# Patient Record
Sex: Male | Born: 1965 | Race: White | Hispanic: No | Marital: Single | Smoking: Never smoker
Health system: Southern US, Community
[De-identification: ages and names within clinical notes are randomized; demographics above are authoritative.]

## PROBLEM LIST (undated history)

## (undated) DIAGNOSIS — Z79891 Long term (current) use of opiate analgesic: Secondary | ICD-10-CM

## (undated) DIAGNOSIS — Z79899 Other long term (current) drug therapy: Secondary | ICD-10-CM

## (undated) DIAGNOSIS — S14109A Unspecified injury at unspecified level of cervical spinal cord, initial encounter: Secondary | ICD-10-CM

## (undated) DIAGNOSIS — G8254 Quadriplegia, C5-C7 incomplete: Secondary | ICD-10-CM

## (undated) HISTORY — PX: TONSILLECTOMY: SUR1361

## (undated) HISTORY — PX: OTHER SURGICAL HISTORY: SHX169

---
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
## 1998-03-11 ENCOUNTER — Encounter: Admission: RE | Admit: 1998-03-11 | Discharge: 1998-03-11 | Payer: Self-pay | Admitting: Sports Medicine

## 1998-03-12 ENCOUNTER — Encounter: Admission: RE | Admit: 1998-03-12 | Discharge: 1998-03-12 | Payer: Self-pay | Admitting: Family Medicine

## 1998-05-06 ENCOUNTER — Ambulatory Visit (HOSPITAL_COMMUNITY): Admission: RE | Admit: 1998-05-06 | Discharge: 1998-05-06 | Payer: Self-pay | Admitting: *Deleted

## 1998-05-06 ENCOUNTER — Encounter: Admission: RE | Admit: 1998-05-06 | Discharge: 1998-05-06 | Payer: Self-pay | Admitting: Sports Medicine

## 1998-07-28 ENCOUNTER — Encounter: Admission: RE | Admit: 1998-07-28 | Discharge: 1998-07-28 | Payer: Self-pay | Admitting: Family Medicine

## 1998-08-22 ENCOUNTER — Encounter: Admission: RE | Admit: 1998-08-22 | Discharge: 1998-08-22 | Payer: Self-pay | Admitting: Family Medicine

## 1998-08-22 ENCOUNTER — Ambulatory Visit (HOSPITAL_COMMUNITY): Admission: RE | Admit: 1998-08-22 | Discharge: 1998-08-22 | Payer: Self-pay | Admitting: Family Medicine

## 1998-08-31 ENCOUNTER — Inpatient Hospital Stay (HOSPITAL_COMMUNITY): Admission: EM | Admit: 1998-08-31 | Discharge: 1998-09-03 | Payer: Self-pay | Admitting: Emergency Medicine

## 1998-09-08 ENCOUNTER — Encounter: Admission: RE | Admit: 1998-09-08 | Discharge: 1998-09-08 | Payer: Self-pay | Admitting: Family Medicine

## 1998-09-11 ENCOUNTER — Encounter: Admission: RE | Admit: 1998-09-11 | Discharge: 1998-09-11 | Payer: Self-pay | Admitting: Family Medicine

## 1998-09-18 ENCOUNTER — Encounter: Admission: RE | Admit: 1998-09-18 | Discharge: 1998-09-18 | Payer: Self-pay | Admitting: Family Medicine

## 1999-02-23 ENCOUNTER — Encounter: Admission: RE | Admit: 1999-02-23 | Discharge: 1999-02-23 | Payer: Self-pay | Admitting: Family Medicine

## 1999-03-06 ENCOUNTER — Encounter: Admission: RE | Admit: 1999-03-06 | Discharge: 1999-03-06 | Payer: Self-pay | Admitting: Family Medicine

## 1999-03-26 ENCOUNTER — Encounter: Admission: RE | Admit: 1999-03-26 | Discharge: 1999-03-26 | Payer: Self-pay | Admitting: Family Medicine

## 1999-05-27 ENCOUNTER — Encounter: Admission: RE | Admit: 1999-05-27 | Discharge: 1999-05-27 | Payer: Self-pay | Admitting: Family Medicine

## 1999-06-08 ENCOUNTER — Encounter: Admission: RE | Admit: 1999-06-08 | Discharge: 1999-06-08 | Payer: Self-pay | Admitting: Family Medicine

## 1999-06-12 ENCOUNTER — Encounter: Admission: RE | Admit: 1999-06-12 | Discharge: 1999-06-12 | Payer: Self-pay | Admitting: Family Medicine

## 1999-08-19 ENCOUNTER — Encounter: Admission: RE | Admit: 1999-08-19 | Discharge: 1999-08-19 | Payer: Self-pay | Admitting: Family Medicine

## 1999-09-18 ENCOUNTER — Encounter: Admission: RE | Admit: 1999-09-18 | Discharge: 1999-12-17 | Payer: Self-pay | Admitting: Sports Medicine

## 1999-12-17 ENCOUNTER — Emergency Department (HOSPITAL_COMMUNITY): Admission: EM | Admit: 1999-12-17 | Discharge: 1999-12-17 | Payer: Self-pay | Admitting: Emergency Medicine

## 1999-12-18 ENCOUNTER — Inpatient Hospital Stay (HOSPITAL_COMMUNITY): Admission: EM | Admit: 1999-12-18 | Discharge: 1999-12-24 | Payer: Self-pay

## 2000-01-06 ENCOUNTER — Encounter: Admission: RE | Admit: 2000-01-06 | Discharge: 2000-01-06 | Payer: Self-pay | Admitting: Family Medicine

## 2000-02-24 ENCOUNTER — Emergency Department (HOSPITAL_COMMUNITY): Admission: EM | Admit: 2000-02-24 | Discharge: 2000-02-24 | Payer: Self-pay | Admitting: Emergency Medicine

## 2000-02-24 ENCOUNTER — Encounter: Payer: Self-pay | Admitting: Emergency Medicine

## 2000-03-15 ENCOUNTER — Emergency Department (HOSPITAL_COMMUNITY): Admission: EM | Admit: 2000-03-15 | Discharge: 2000-03-15 | Payer: Self-pay | Admitting: Emergency Medicine

## 2000-03-23 ENCOUNTER — Encounter: Admission: RE | Admit: 2000-03-23 | Discharge: 2000-03-23 | Payer: Self-pay | Admitting: Family Medicine

## 2000-03-31 ENCOUNTER — Encounter: Admission: RE | Admit: 2000-03-31 | Discharge: 2000-03-31 | Payer: Self-pay | Admitting: Family Medicine

## 2004-04-10 ENCOUNTER — Ambulatory Visit: Payer: Self-pay | Admitting: Psychiatry

## 2004-04-10 ENCOUNTER — Inpatient Hospital Stay (HOSPITAL_COMMUNITY): Admission: RE | Admit: 2004-04-10 | Discharge: 2004-04-19 | Payer: Self-pay | Admitting: Psychiatry

## 2004-05-05 ENCOUNTER — Encounter: Admission: RE | Admit: 2004-05-05 | Discharge: 2004-06-01 | Payer: Self-pay | Admitting: Internal Medicine

## 2005-12-29 ENCOUNTER — Encounter: Admission: RE | Admit: 2005-12-29 | Discharge: 2005-12-29 | Payer: Self-pay | Admitting: Internal Medicine

## 2006-02-21 ENCOUNTER — Inpatient Hospital Stay (HOSPITAL_COMMUNITY): Admission: RE | Admit: 2006-02-21 | Discharge: 2006-02-24 | Payer: Self-pay | Admitting: *Deleted

## 2008-10-01 ENCOUNTER — Encounter: Admission: RE | Admit: 2008-10-01 | Discharge: 2008-12-30 | Payer: Self-pay | Admitting: Internal Medicine

## 2009-11-17 ENCOUNTER — Encounter
Admission: RE | Admit: 2009-11-17 | Discharge: 2009-12-22 | Payer: Self-pay | Admitting: Physical Medicine & Rehabilitation

## 2009-12-22 ENCOUNTER — Other Ambulatory Visit: Payer: Self-pay | Admitting: Emergency Medicine

## 2009-12-24 ENCOUNTER — Inpatient Hospital Stay (HOSPITAL_COMMUNITY): Admission: RE | Admit: 2009-12-24 | Discharge: 2009-12-26 | Payer: Self-pay | Admitting: Psychiatry

## 2009-12-24 ENCOUNTER — Ambulatory Visit: Payer: Self-pay | Admitting: Psychiatry

## 2010-09-28 LAB — DIFFERENTIAL
Basophils Absolute: 0 10*3/uL (ref 0.0–0.1)
Basophils Relative: 0 % (ref 0–1)
Eosinophils Absolute: 0.3 10*3/uL (ref 0.0–0.7)
Eosinophils Relative: 3 % (ref 0–5)
Lymphocytes Relative: 25 % (ref 12–46)
Lymphs Abs: 2.1 10*3/uL (ref 0.7–4.0)
Monocytes Absolute: 1.2 10*3/uL — ABNORMAL HIGH (ref 0.1–1.0)
Monocytes Relative: 14 % — ABNORMAL HIGH (ref 3–12)
Neutro Abs: 4.9 10*3/uL (ref 1.7–7.7)
Neutrophils Relative %: 58 % (ref 43–77)

## 2010-09-28 LAB — COMPREHENSIVE METABOLIC PANEL
ALT: 24 U/L (ref 0–53)
AST: 24 U/L (ref 0–37)
Albumin: 4.4 g/dL (ref 3.5–5.2)
Alkaline Phosphatase: 78 U/L (ref 39–117)
BUN: 11 mg/dL (ref 6–23)
CO2: 23 mEq/L (ref 19–32)
Calcium: 9.1 mg/dL (ref 8.4–10.5)
Chloride: 104 mEq/L (ref 96–112)
Creatinine, Ser: 1.28 mg/dL (ref 0.4–1.5)
GFR calc Af Amer: >60 mL/min (ref >60)
GFR calc non Af Amer: >60 mL/min (ref >60)
Glucose, Bld: 93 mg/dL (ref 70–99)
Potassium: 3.8 mEq/L (ref 3.5–5.1)
Sodium: 135 mEq/L (ref 135–145)
Total Bilirubin: 1.1 mg/dL (ref 0.3–1.2)
Total Protein: 8.3 g/dL (ref 6.0–8.3)

## 2010-09-28 LAB — URINALYSIS, ROUTINE W REFLEX MICROSCOPIC
Bilirubin Urine: NEGATIVE
Glucose, UA: NEGATIVE mg/dL
Hgb urine dipstick: NEGATIVE
Nitrite: NEGATIVE
Protein, ur: NEGATIVE mg/dL
Specific Gravity, Urine: 1.014 (ref 1.005–1.030)
Urobilinogen, UA: 0.2 mg/dL (ref 0.0–1.0)
pH: 5.5 (ref 5.0–8.0)

## 2010-09-28 LAB — RAPID URINE DRUG SCREEN, HOSP PERFORMED
Amphetamines: NOT DETECTED
Barbiturates: NOT DETECTED
Benzodiazepines: POSITIVE — AB
Cocaine: POSITIVE — AB
Opiates: NOT DETECTED
Tetrahydrocannabinol: NOT DETECTED

## 2010-09-28 LAB — CBC
HCT: 41.1 % (ref 39.0–52.0)
Hemoglobin: 14.3 g/dL (ref 13.0–17.0)
MCHC: 34.8 g/dL (ref 30.0–36.0)
MCV: 86 fL (ref 78.0–100.0)
Platelets: 375 10*3/uL (ref 150–400)
RBC: 4.78 MIL/uL (ref 4.22–5.81)
RDW: 13.1 % (ref 11.5–15.5)
WBC: 8.4 10*3/uL (ref 4.0–10.5)

## 2010-09-28 LAB — TROPONIN I: Troponin I: 0.01 ng/mL (ref 0.00–0.06)

## 2010-09-28 LAB — ETHANOL: Alcohol, Ethyl (B): <5 mg/dL (ref 0–10)

## 2010-09-28 LAB — CK TOTAL AND CKMB (NOT AT ARMC)
CK, MB: 2.3 ng/mL (ref 0.3–4.0)
Relative Index: 1 (ref 0.0–2.5)
Total CK: 220 U/L (ref 7–232)
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
## 2010-11-27 NOTE — Op Note (Signed)
[name], Mark Porter               ACCOUNT NO.:  [id number]   MEDICAL RECORD NO.:  [id number]          PATIENT TYPE:  INP   LOCATION:  1608                         [facility]ACILITY[facility]   PHYSICIAN:  Lebron Conners, M.D.   DATE OF BIRTH:  1965-10-25   DATE OF PROCEDURE:  02/21/2006  DATE OF DISCHARGE:                                 OPERATIVE REPORT   PRE AND POSTOPERATIVE DIAGNOSIS:  Complex abscess left arm.   OPERATION:  Debridement and drainage of abscess.   SURGEON:  Lebron Conners, M.D.   ANESTHESIA:  General.   PROCEDURE:  After the patient was monitored and asleep and had routine  preparation and draping of the left arm I made an incision around obviously  necrotic areas of skin and removed them.  I immediately went into a large  abscess cavity containing a very large amount of pus.  I evacuated that and  took cultures.  It went all way down to the muscle but I could not detect  any involvement of the bone or joint.  It was extensively undermined in the  subcutaneous plane.  A couple of other small areas of skin were obviously  necrotic and I debrided those as well.  I put a Penrose drains in the  extremities of the abscess cavity brought out through the main incision and  secured back to themselves to provide ongoing secured drainage.  After  getting good hemostasis, I packed the cavity with moist gauze and covered it  with a bulky slightly compressive bandage.  He tolerated it well and blood  loss was minimal.      Lebron Conners, M.D.  Electronically Signed     WB/MEDQ  D:  02/21/2006  T:  02/22/2006  Job:  [id number]
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
## 2010-11-27 NOTE — Discharge Summary (Signed)
NAMEVON, QUINTANAR               ACCOUNT NO.:  [id number]   MEDICAL RECORD NO.:  [id number]          PATIENT TYPE:  INP   LOCATION:  1608                         FACILITY:  [facility]   PHYSICIAN:  Lebron Conners, M.D.   DATE OF BIRTH:  1966/01/27   DATE OF ADMISSION:  02/21/2006  DATE OF DISCHARGE:  02/24/2006                                 DISCHARGE SUMMARY   HISTORY:  Mark Porter is a 45 year old quadriplegic man with an increasing  infection on the left arm who was admitted on February 21, 2006, by my  associate, Dr. Simona Huh, for an obvious abscess which needed operative drainage.  See history and physical for details.   HOSPITAL COURSE:  As I was on-call, I cared for Mark Porter, and on February 21, 2006, took him to the operating room and under general anesthesia  drained large abscess in the soft tissues of the left arm.  It did not  appear to involve the bone.  He had a lot of pain postoperatively and  cellulitis was present.  It gradually improved, however.  By February 24, 2006, he was afebrile.  The redness was decreased and the drainage was  decreased.  He was sent home on doxycycline with presumptive diagnosis of  methicillin-resistant Staphylococcus aureus.  Home Health nursing was  arranged to change the bandages for him.  It was suggested that he be seen  in our office in 4-7 days.   DIAGNOSIS:  1. Abscess left arm.  2. Quadriplegia.  3. History of substance abuse.   OPERATION:  Incision and drainage of abscess left arm.   DISCHARGE CONDITION:  Improved.      Lebron Conners, M.D.  Electronically Signed     WB/MEDQ  D:  03/29/2006  T:  03/30/2006  Job:  [id number]

## 2013-01-07 ENCOUNTER — Encounter (HOSPITAL_COMMUNITY): Payer: Self-pay | Admitting: *Deleted

## 2013-01-07 ENCOUNTER — Emergency Department (HOSPITAL_COMMUNITY)
Admission: EM | Admit: 2013-01-07 | Discharge: 2013-01-07 | Disposition: A | Discharge status: Home / Self Care | Payer: Medicare Other | Attending: Emergency Medicine | Admitting: Emergency Medicine

## 2013-01-07 DIAGNOSIS — G8254 Quadriplegia, C5-C7 incomplete: Secondary | ICD-10-CM | POA: Insufficient documentation

## 2013-01-07 DIAGNOSIS — M542 Cervicalgia: Secondary | ICD-10-CM | POA: Insufficient documentation

## 2013-01-07 DIAGNOSIS — M25559 Pain in unspecified hip: Secondary | ICD-10-CM | POA: Insufficient documentation

## 2013-01-07 DIAGNOSIS — G8929 Other chronic pain: Secondary | ICD-10-CM

## 2013-01-07 DIAGNOSIS — M546 Pain in thoracic spine: Secondary | ICD-10-CM | POA: Insufficient documentation

## 2013-01-07 HISTORY — DX: Quadriplegia, C5-C7 incomplete: G82.54

## 2013-01-07 MED ORDER — OXYCODONE-ACETAMINOPHEN 10-325 MG PO TABS: 1.0000 | ORAL_TABLET | ORAL | Status: DC | PRN

## 2013-01-07 MED ORDER — DIAZEPAM 10 MG PO TABS: 10.0000 mg | ORAL_TABLET | Freq: Four times a day (QID) | ORAL | Status: DC | PRN
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
## 2013-01-07 NOTE — ED Notes (Signed)
Pt reports having chronic pain, has been out of meds and is currently changing doctors, doesn't have an appt until the 11th and needs meds until then. No acute distress noted at triage.

## 2013-01-07 NOTE — ED Notes (Signed)
Called to triage x3; no response

## 2013-01-07 NOTE — ED Notes (Signed)
Pt states he was in the bathroom when staff were calling for triage

## 2013-01-08 NOTE — ED Provider Notes (Signed)
Medical screening examination/treatment/procedure(s) were performed by non-physician practitioner and as supervising physician I was immediately available for consultation/collaboration.    Y. , MD 01/08/13 1022 

## 2013-01-17 ENCOUNTER — Encounter (HOSPITAL_COMMUNITY): Payer: Self-pay | Admitting: Emergency Medicine

## 2013-01-17 DIAGNOSIS — G8929 Other chronic pain: Secondary | ICD-10-CM | POA: Insufficient documentation

## 2013-01-17 DIAGNOSIS — M549 Dorsalgia, unspecified: Secondary | ICD-10-CM | POA: Insufficient documentation

## 2013-01-17 DIAGNOSIS — IMO0001 Reserved for inherently not codable concepts without codable children: Secondary | ICD-10-CM | POA: Insufficient documentation

## 2013-01-17 DIAGNOSIS — Z8669 Personal history of other diseases of the nervous system and sense organs: Secondary | ICD-10-CM | POA: Insufficient documentation

## 2013-01-17 NOTE — ED Notes (Signed)
PT. REQUESTING PRESCRIPTION FOR HIS PAIN / SPASMS MEDICATIONS , PT. STATED HE RAN OUT OF HIS MEDICATIONS FOR 4 DAYS .

## 2013-01-18 ENCOUNTER — Emergency Department (HOSPITAL_COMMUNITY)
Admission: EM | Admit: 2013-01-18 | Discharge: 2013-01-18 | Disposition: A | Discharge status: Home / Self Care | Payer: Medicare Other | Attending: Emergency Medicine | Admitting: Emergency Medicine

## 2013-01-18 DIAGNOSIS — G8929 Other chronic pain: Secondary | ICD-10-CM

## 2013-01-18 MED ORDER — DIAZEPAM 10 MG PO TABS: 10.0000 mg | ORAL_TABLET | Freq: Four times a day (QID) | ORAL | Status: DC | PRN

## 2013-01-18 MED ORDER — DIAZEPAM 5 MG PO TABS
10.0000 mg | ORAL_TABLET | Freq: Once | ORAL | Status: AC
  Administered 2013-01-18: 10 mg via ORAL
  Filled 2013-01-18: qty 2

## 2013-01-18 MED ORDER — OXYCODONE-ACETAMINOPHEN 10-325 MG PO TABS: 1.0000 | ORAL_TABLET | ORAL | Status: DC | PRN

## 2013-01-18 MED ORDER — OXYCODONE-ACETAMINOPHEN 5-325 MG PO TABS
2.0000 | ORAL_TABLET | Freq: Once | ORAL | Status: AC
  Administered 2013-01-18: 2 via ORAL
  Filled 2013-01-18: qty 2
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
## 2013-01-18 NOTE — ED Provider Notes (Signed)
Medical screening examination/treatment/procedure(s) were performed by non-physician practitioner and as supervising physician I was immediately available for consultation/collaboration.   M , MD 01/18/13 2156 

## 2013-01-18 NOTE — Discharge Instructions (Signed)
Chronic Pain Management Managing chronic pain is not easy. The goal is to provide as much pain relief as possible. There are emotional as well as physical problems. Chronic pain may lead to symptoms of depression which magnify those of the pain. Problems may include:  Anxiety.  Sleep disturbances.  Confused thinking.  Feeling cranky.  Fatigue.  Weight gain or loss. Identify the source of the pain first, if possible. The pain may be masking another problem. Try to find a pain management specialist or clinic. Work with a team to create a treatment plan for you. MEDICATIONS  May include narcotics or opioids. Larger than normal doses may be needed to control your pain.  Drugs for depression may help.  Over-the-counter medicines may help for some conditions. These drugs may be used along with others for better pain relief.  May be injected into sites such as the spine and joints. Injections may have to be repeated if they wear off. THERAPY MAY INCLUDE:  Working with a physical therapist to keep from getting stiff.  Regular, gentle exercise.  Cognitive or behavioral therapy.  Using complementary or integrative medicine such as:  Acupuncture.  Massage, Reiki, or Rolfing.  Aroma, color, light, or sound therapy.  Group support. FOR MORE INFORMATION [email or web address]. American Chronic Pain Association [email or web address]. Document Released: 08/05/2004 Document Revised: 09/20/2011 Document Reviewed: 09/14/2007 [facility] Patient Information 2014 [place], [place]. RESOURCE GUIDE  Chronic Pain Problems: Contact [name]  [phone] Patients need to be referred by their primary care doctor.  Insufficient Money for Medicine: Contact United Way:  call "211."   No Primary Care Doctor: - Call Health Connect  [phone] - can help you locate a primary care doctor that  accepts your insurance, provides certain services, etc. - Physician Referral  Service- [phone]  Agencies that provide inexpensive medical care: - [facility]  [phone] - [facility]  [phone] - [facility]  [phone] - Women's Clinic  [phone] - Planned Parenthood  [phone] [name]  [phone]  Medicaid-accepting [facility] Providers: - [facility]- [address]  [phone], Mon-Fri 9am-7pm, Sat 9am-1pm - [facility]- [address] [place], [address] [place]  [phone] - [facility]- [address] [place]  [phone] [facility]- [address]  [phone] - Renaye Rakers- [address] [place], [address], [phone]  Only accepts [place] patients after they have their name  applied to their card  Self Pay (no insurance) in [place]: - Sickle Cell Patients - [facility]  [address] [place], [phone] - [facility]- [address] [place]  [phone]       [facility] [place]- [address]       -     [facility]- see information above (Speak to [employer] if you do not have insurance)       -  [facility]- [address],  [phone]       -  [facility]- [address], [phone]       -  Dr Julio Sicks-  [address], [place], [phone]       -  [facility] - [address] [phone]       -  [facility]- [address], [phone], also [address] [phone]       -     [facility]- [address] [place], [phone], 1st & 3rd Saturday         every month, 10am-1pm  -     [facility]   [address] [place], [place].   Phone:  [phone], Fax:  [phone]. Hours of Operation:  9 am - 6 pm, M-F.  -     [facility]   [address], [place]   Phone:  [phone], Fax: [phone]. Hours of Operation:  8:30 am - 5:30 pm, M-F.  [facility] [address] [place] [place], [place] [address] [phone]  [facility] [address] [place], [place] [address] [phone]  1) Find a Doctor and Pay Out of Pocket Although you won't have to find out who is covered by your insurance plan, it is a good idea to ask around and get recommendations. You will then need to call the office and see if the doctor you have chosen will accept you as a new patient and what types of options they offer for patients who are self-pay. Some doctors offer discounts or will set up payment plans for their patients who do not have insurance, but you will need to ask so you aren't surprised when you get to your appointment.  2) Contact Your Local Health Department Not all health departments have doctors that can see patients for sick visits, but many do, so it is worth a call to see if yours does. If you don't know where your local health department is, you can check in your phone book. The CDC also has a tool to help you locate your state's health department, and many state websites also have listings of all of their local health departments.  3) Find a Walk-in Clinic If your illness is not likely to be very severe or complicated, you may want to try a walk in clinic. These are popping up all over the country in pharmacies, drugstores, and shopping centers. They're usually staffed by nurse practitioners or physician assistants that have been trained to treat common illnesses and complaints. They're usually fairly quick and inexpensive. However, if you have serious medical issues or chronic medical problems, these are probably not your best option  STD Testing - [facility] [address] [facility] [address] [facility] Department of [facility] [place], STD Clinic, [address], [place], phone [phone] or [phone].  Monday - Friday, call for an appointment. [facility] Department of [employer], STD Clinic, [address], [place], phone [phone] or [phone].  Monday - Friday, call for an appointment.  Abuse/Neglect: [facility] Child Abuse Hotline [phone] [facility] Child Abuse Hotline [phone] (After Hours)  Emergency Shelter:  [facility] 8624265600  Maternity Homes: - [facility] [place] [facility] [phone] - [facility] [phone]  MRSA Hotline #:   302-519-9967  Dental Assistance If unable to pay or uninsured, contact:  [facility]. to become qualified for the adult dental clinic.  Patients with Medicaid: [facility] [phone] [address], [phone] [phone]  If unable to pay, or uninsured, contact [facility] [phone] in [place], [phone] in [place]) to become qualified for the adult dental clinic  [address]  Dental Clinic [address] [place], [place] [address] [phone] [email or web address]  Other Proofreader Services: - [facility]- [address] [place], [place], [place], [address], [phone], Ext. [phone], 2nd and 4th Thursday of the month at 6:30am.  10 clients each day by appointment, can sometimes see walk-in patients if someone does not show for an appointment. [facility]- [address] [place], [place], [address], [phone] - [facility]- [address] [place], [place], [address], [phone] - [facility]- [phone] [facility] Department- [phone] [facility]- [phone]

## 2013-01-22 ENCOUNTER — Encounter (HOSPITAL_COMMUNITY): Payer: Self-pay | Admitting: Emergency Medicine

## 2013-01-22 ENCOUNTER — Emergency Department (HOSPITAL_COMMUNITY)
Admission: EM | Admit: 2013-01-22 | Discharge: 2013-01-22 | Disposition: A | Discharge status: Home / Self Care | Payer: Medicare Other | Attending: Emergency Medicine | Admitting: Emergency Medicine

## 2013-01-22 DIAGNOSIS — G8929 Other chronic pain: Secondary | ICD-10-CM | POA: Insufficient documentation

## 2013-01-22 DIAGNOSIS — M25519 Pain in unspecified shoulder: Secondary | ICD-10-CM | POA: Insufficient documentation

## 2013-01-22 DIAGNOSIS — M79609 Pain in unspecified limb: Secondary | ICD-10-CM | POA: Insufficient documentation

## 2013-01-22 DIAGNOSIS — M549 Dorsalgia, unspecified: Secondary | ICD-10-CM | POA: Insufficient documentation

## 2013-01-22 MED ORDER — DIAZEPAM 5 MG PO TABS
5.0000 mg | ORAL_TABLET | Freq: Once | ORAL | Status: AC
  Administered 2013-01-22: 5 mg via ORAL
  Filled 2013-01-22: qty 1

## 2013-01-22 MED ORDER — OXYCODONE-ACETAMINOPHEN 5-325 MG PO TABS
2.0000 | ORAL_TABLET | Freq: Once | ORAL | Status: AC
  Administered 2013-01-22: 2 via ORAL
  Filled 2013-01-22: qty 2
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
## 2013-01-22 NOTE — ED Notes (Signed)
Patient states that he was supposed to see a spinal specialist (new doctor) today, but the office called him on Friday and rescheduled him for 02/02/13.  Patient states he has run out of pain medication and needs refill.

## 2013-01-22 NOTE — ED Notes (Signed)
Went to discharge patient.  He requests to speak with MD again.

## 2013-01-31 ENCOUNTER — Emergency Department (HOSPITAL_COMMUNITY)
Admission: EM | Admit: 2013-01-31 | Discharge: 2013-02-01 | Disposition: A | Discharge status: Home / Self Care | Payer: Medicare Other | Attending: Emergency Medicine | Admitting: Emergency Medicine

## 2013-01-31 ENCOUNTER — Encounter (HOSPITAL_COMMUNITY): Payer: Self-pay | Admitting: Emergency Medicine

## 2013-01-31 DIAGNOSIS — G8254 Quadriplegia, C5-C7 incomplete: Secondary | ICD-10-CM | POA: Insufficient documentation

## 2013-01-31 DIAGNOSIS — Z79899 Other long term (current) drug therapy: Secondary | ICD-10-CM | POA: Insufficient documentation

## 2013-01-31 DIAGNOSIS — G8929 Other chronic pain: Secondary | ICD-10-CM | POA: Insufficient documentation

## 2013-01-31 DIAGNOSIS — Z760 Encounter for issue of repeat prescription: Secondary | ICD-10-CM | POA: Insufficient documentation

## 2013-01-31 NOTE — ED Notes (Signed)
PT. REQUESTING PRESCRIPTIONS FOR HIS VALIUM AND PERCOCET FOR HIS CHRONIC PAIN , HE RAN OUT 2 WEEKS AGO.

## 2013-02-01 MED ORDER — DIAZEPAM 5 MG PO TABS: 5.0000 mg | ORAL_TABLET | Freq: Four times a day (QID) | ORAL | Status: DC | PRN

## 2013-02-01 MED ORDER — OXYCODONE-ACETAMINOPHEN 5-325 MG PO TABS: ORAL_TABLET | ORAL | Status: DC

## 2013-02-01 NOTE — ED Provider Notes (Signed)
Medical screening examination/treatment/procedure(s) were performed by non-physician practitioner and as supervising physician I was immediately available for consultation/collaboration.   Hanley Seamen, MD 02/01/13 1205

## 2013-02-01 NOTE — ED Notes (Signed)
No change from Triage notes.
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
## 2014-02-05 ENCOUNTER — Other Ambulatory Visit (HOSPITAL_COMMUNITY): Payer: Self-pay | Admitting: Psychiatry

## 2014-02-05 ENCOUNTER — Ambulatory Visit (HOSPITAL_COMMUNITY)
Admission: RE | Admit: 2014-02-05 | Discharge: 2014-02-05 | Disposition: A | Discharge status: Home / Self Care | Payer: Medicare Other | Source: Ambulatory Visit | Attending: Psychiatry | Admitting: Psychiatry

## 2014-02-05 DIAGNOSIS — M479 Spondylosis, unspecified: Secondary | ICD-10-CM | POA: Diagnosis not present

## 2014-02-05 DIAGNOSIS — R52 Pain, unspecified: Secondary | ICD-10-CM | POA: Insufficient documentation

## 2014-02-05 DIAGNOSIS — M542 Cervicalgia: Secondary | ICD-10-CM

## 2015-01-12 ENCOUNTER — Emergency Department (HOSPITAL_COMMUNITY): Payer: Medicare Other

## 2015-01-12 ENCOUNTER — Emergency Department (HOSPITAL_COMMUNITY)
Admission: EM | Admit: 2015-01-12 | Discharge: 2015-01-12 | Disposition: A | Discharge status: Home / Self Care | Payer: Medicare Other | Attending: Emergency Medicine | Admitting: Emergency Medicine

## 2015-01-12 ENCOUNTER — Encounter (HOSPITAL_COMMUNITY): Payer: Self-pay | Admitting: *Deleted

## 2015-01-12 DIAGNOSIS — Z79899 Other long term (current) drug therapy: Secondary | ICD-10-CM | POA: Insufficient documentation

## 2015-01-12 DIAGNOSIS — S2232XA Fracture of one rib, left side, initial encounter for closed fracture: Secondary | ICD-10-CM

## 2015-01-12 DIAGNOSIS — W108XXA Fall (on) (from) other stairs and steps, initial encounter: Secondary | ICD-10-CM | POA: Diagnosis not present

## 2015-01-12 DIAGNOSIS — S299XXA Unspecified injury of thorax, initial encounter: Secondary | ICD-10-CM | POA: Diagnosis present

## 2015-01-12 DIAGNOSIS — Y9389 Activity, other specified: Secondary | ICD-10-CM | POA: Diagnosis not present

## 2015-01-12 DIAGNOSIS — Y92009 Unspecified place in unspecified non-institutional (private) residence as the place of occurrence of the external cause: Secondary | ICD-10-CM | POA: Insufficient documentation

## 2015-01-12 DIAGNOSIS — Y998 Other external cause status: Secondary | ICD-10-CM | POA: Insufficient documentation

## 2015-01-12 DIAGNOSIS — S2242XA Multiple fractures of ribs, left side, initial encounter for closed fracture: Secondary | ICD-10-CM | POA: Insufficient documentation

## 2015-01-12 LAB — URINALYSIS, ROUTINE W REFLEX MICROSCOPIC
Glucose, UA: NEGATIVE mg/dL
Ketones, ur: 15 mg/dL — AB
Nitrite: NEGATIVE
Protein, ur: 100 mg/dL — AB
Specific Gravity, Urine: 1.028 (ref 1.005–1.030)
Urobilinogen, UA: 1 mg/dL (ref 0.0–1.0)
pH: 5 (ref 5.0–8.0)

## 2015-01-12 LAB — CBC WITH DIFFERENTIAL/PLATELET
Basophils Absolute: 0.1 10*3/uL (ref 0.0–0.1)
Basophils Relative: 0 % (ref 0–1)
Eosinophils Absolute: 0.5 10*3/uL (ref 0.0–0.7)
Eosinophils Relative: 4 % (ref 0–5)
HCT: 38.7 % — ABNORMAL LOW (ref 39.0–52.0)
Hemoglobin: 13.2 g/dL (ref 13.0–17.0)
Lymphocytes Relative: 33 % (ref 12–46)
Lymphs Abs: 4.8 10*3/uL — ABNORMAL HIGH (ref 0.7–4.0)
MCH: 28 pg (ref 26.0–34.0)
MCHC: 34.1 g/dL (ref 30.0–36.0)
MCV: 82 fL (ref 78.0–100.0)
Monocytes Absolute: 1.5 10*3/uL — ABNORMAL HIGH (ref 0.1–1.0)
Monocytes Relative: 10 % (ref 3–12)
Neutro Abs: 7.6 10*3/uL (ref 1.7–7.7)
Neutrophils Relative %: 53 % (ref 43–77)
Platelets: 537 10*3/uL — ABNORMAL HIGH (ref 150–400)
RBC: 4.72 MIL/uL (ref 4.22–5.81)
RDW: 13.1 % (ref 11.5–15.5)
WBC: 14.4 10*3/uL — ABNORMAL HIGH (ref 4.0–10.5)

## 2015-01-12 LAB — COMPREHENSIVE METABOLIC PANEL
ALT: 18 U/L (ref 17–63)
AST: 23 U/L (ref 15–41)
Albumin: 3.5 g/dL (ref 3.5–5.0)
Alkaline Phosphatase: 93 U/L (ref 38–126)
Anion gap: 10 (ref 5–15)
BUN: 19 mg/dL (ref 6–20)
CO2: 23 mmol/L (ref 22–32)
Calcium: 8.6 mg/dL — ABNORMAL LOW (ref 8.9–10.3)
Chloride: 101 mmol/L (ref 101–111)
Creatinine, Ser: 1.56 mg/dL — ABNORMAL HIGH (ref 0.61–1.24)
GFR calc Af Amer: 59 mL/min — ABNORMAL LOW (ref >60)
GFR calc non Af Amer: 51 mL/min — ABNORMAL LOW (ref >60)
Glucose, Bld: 86 mg/dL (ref 65–99)
Potassium: 4 mmol/L (ref 3.5–5.1)
Sodium: 134 mmol/L — ABNORMAL LOW (ref 135–145)
Total Bilirubin: 0.4 mg/dL (ref 0.3–1.2)
Total Protein: 7.5 g/dL (ref 6.5–8.1)

## 2015-01-12 LAB — URINE MICROSCOPIC-ADD ON

## 2015-01-12 MED ORDER — OXYCODONE-ACETAMINOPHEN 10-325 MG PO TABS: 1.0000 | ORAL_TABLET | ORAL | Status: DC | PRN

## 2015-01-12 MED ORDER — HYDROMORPHONE HCL 1 MG/ML IJ SOLN
1.0000 mg | Freq: Once | INTRAMUSCULAR | Status: AC
  Administered 2015-01-12: 1 mg via INTRAMUSCULAR
  Filled 2015-01-12: qty 1

## 2015-01-12 MED ORDER — SODIUM CHLORIDE 0.9 % IV BOLUS (SEPSIS)
1000.0000 mL | Freq: Once | INTRAVENOUS | Status: AC
  Administered 2015-01-12: 1000 mL via INTRAVENOUS

## 2015-01-12 MED ORDER — CLINDAMYCIN HCL 150 MG PO CAPS: 450.0000 mg | ORAL_CAPSULE | Freq: Three times a day (TID) | ORAL | Status: DC

## 2015-01-12 MED ORDER — HYDROMORPHONE HCL 1 MG/ML IJ SOLN
1.0000 mg | Freq: Once | INTRAMUSCULAR | Status: AC
  Administered 2015-01-12: 1 mg via INTRAVENOUS
  Filled 2015-01-12: qty 1

## 2015-01-12 MED ORDER — SODIUM CHLORIDE 0.9 % IV BOLUS (SEPSIS): 1000.0000 mL | Freq: Once | INTRAVENOUS | Status: DC
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
## 2015-01-12 NOTE — ED Notes (Signed)
Pt aware of urine sample needed, pt unable to go at this time. 
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
## 2015-01-12 NOTE — Care Management Note (Signed)
Case Management Note  Patient Details  Name: Mark Porter MRN: [id number] Date of Birth: 02-07-1966  Subjective/Objective:                    Action/Plan:   Expected Discharge Date:                  Expected Discharge Plan:     In-House Referral:     Discharge planning Services  CM Consult  Post Acute Care Choice:    Choice offered to:     DME Arranged:    DME Agency:     HH Arranged:  RN HH Agency:     Status of Service:  In process, will continue to follow  Medicare Important Message Given:    Date Medicare IM Given:    Medicare IM give by:    Date Additional Medicare IM Given:    Additional Medicare Important Message give by:     If discussed at Long Length of Stay Meetings, dates discussed:    Additional Comments:  Yvone NeuCrutchfield,  M, RN 01/12/2015, 2:04 PM

## 2015-01-12 NOTE — Discharge Instructions (Signed)
Return here as needed. You do have fractured ribs, so you can use heat and ice on the area that is sore .  Follow up with your primary care doctor
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
## 2015-01-12 NOTE — ED Notes (Signed)
Pt still unable to give urine sample

## 2015-01-12 NOTE — ED Notes (Signed)
Pt is off the floor  

## 2015-01-12 NOTE — ED Notes (Signed)
Attempted a urinary in and out catheter. Was unable to get past the Patient's prostate with multiple tries and various techniques.
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
## 2015-01-12 NOTE — ED Notes (Signed)
NAD at this time. [facility] has been called to pick pt up.

## 2015-05-08 ENCOUNTER — Ambulatory Visit: Payer: Medicare Other | Attending: Neurology | Admitting: Physical Therapy

## 2015-05-08 DIAGNOSIS — M6281 Muscle weakness (generalized): Secondary | ICD-10-CM | POA: Diagnosis not present

## 2015-05-08 DIAGNOSIS — R532 Functional quadriplegia: Secondary | ICD-10-CM | POA: Diagnosis present
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
## 2016-03-15 ENCOUNTER — Emergency Department (HOSPITAL_COMMUNITY): Payer: Medicare Other

## 2016-03-15 ENCOUNTER — Emergency Department (HOSPITAL_COMMUNITY)
Admission: EM | Admit: 2016-03-15 | Discharge: 2016-03-15 | Disposition: A | Discharge status: Home / Self Care | Payer: Medicare Other | Attending: Emergency Medicine | Admitting: Emergency Medicine

## 2016-03-15 ENCOUNTER — Encounter (HOSPITAL_COMMUNITY): Payer: Self-pay | Admitting: Emergency Medicine

## 2016-03-15 DIAGNOSIS — Y939 Activity, unspecified: Secondary | ICD-10-CM | POA: Insufficient documentation

## 2016-03-15 DIAGNOSIS — S20212A Contusion of left front wall of thorax, initial encounter: Secondary | ICD-10-CM | POA: Diagnosis not present

## 2016-03-15 DIAGNOSIS — Y9289 Other specified places as the place of occurrence of the external cause: Secondary | ICD-10-CM | POA: Diagnosis not present

## 2016-03-15 DIAGNOSIS — R1012 Left upper quadrant pain: Secondary | ICD-10-CM | POA: Diagnosis not present

## 2016-03-15 DIAGNOSIS — S299XXA Unspecified injury of thorax, initial encounter: Secondary | ICD-10-CM | POA: Diagnosis present

## 2016-03-15 DIAGNOSIS — W050XXA Fall from non-moving wheelchair, initial encounter: Secondary | ICD-10-CM | POA: Insufficient documentation

## 2016-03-15 DIAGNOSIS — Y999 Unspecified external cause status: Secondary | ICD-10-CM | POA: Diagnosis not present

## 2016-03-15 DIAGNOSIS — R0781 Pleurodynia: Secondary | ICD-10-CM

## 2016-03-15 DIAGNOSIS — W19XXXA Unspecified fall, initial encounter: Secondary | ICD-10-CM

## 2016-03-15 LAB — CBC WITH DIFFERENTIAL/PLATELET
BASOS ABS: 0 10*3/uL (ref 0.0–0.1)
BASOS PCT: 0 %
EOS PCT: 4 %
Eosinophils Absolute: 0.4 10*3/uL (ref 0.0–0.7)
HEMATOCRIT: 40.5 % (ref 39.0–52.0)
Hemoglobin: 13.8 g/dL (ref 13.0–17.0)
Lymphocytes Relative: 26 %
Lymphs Abs: 2.6 10*3/uL (ref 0.7–4.0)
MCH: 28.7 pg (ref 26.0–34.0)
MCHC: 34.1 g/dL (ref 30.0–36.0)
MCV: 84.2 fL (ref 78.0–100.0)
MONO ABS: 1.1 10*3/uL — AB (ref 0.1–1.0)
MONOS PCT: 11 %
NEUTROS ABS: 6 10*3/uL (ref 1.7–7.7)
Neutrophils Relative %: 59 %
PLATELETS: 429 10*3/uL — AB (ref 150–400)
RBC: 4.81 MIL/uL (ref 4.22–5.81)
RDW: 12.7 % (ref 11.5–15.5)
WBC: 10.2 10*3/uL (ref 4.0–10.5)

## 2016-03-15 LAB — BASIC METABOLIC PANEL
ANION GAP: 9 (ref 5–15)
BUN: 15 mg/dL (ref 6–20)
CALCIUM: 9.3 mg/dL (ref 8.9–10.3)
CO2: 21 mmol/L — AB (ref 22–32)
CREATININE: 1.18 mg/dL (ref 0.61–1.24)
Chloride: 107 mmol/L (ref 101–111)
GFR calc Af Amer: >60 mL/min (ref >60)
Glucose, Bld: 89 mg/dL (ref 65–99)
Potassium: 3.7 mmol/L (ref 3.5–5.1)
Sodium: 137 mmol/L (ref 135–145)

## 2016-03-15 MED ORDER — ONDANSETRON 4 MG PO TBDP
4.0000 mg | ORAL_TABLET | Freq: Once | ORAL | Status: AC
  Administered 2016-03-15: 4 mg via ORAL
  Filled 2016-03-15: qty 1

## 2016-03-15 MED ORDER — HYDROMORPHONE HCL 1 MG/ML IJ SOLN
0.5000 mg | Freq: Once | INTRAMUSCULAR | Status: AC
  Administered 2016-03-15: 0.5 mg via INTRAMUSCULAR
  Filled 2016-03-15: qty 1

## 2016-03-15 MED ORDER — POTASSIUM CHLORIDE CRYS ER 20 MEQ PO TBCR: 40.0000 meq | EXTENDED_RELEASE_TABLET | Freq: Once | ORAL | Status: DC

## 2016-03-15 MED ORDER — DIAZEPAM 5 MG PO TABS
10.0000 mg | ORAL_TABLET | Freq: Once | ORAL | Status: AC
  Administered 2016-03-15: 10 mg via ORAL
  Filled 2016-03-15: qty 2

## 2016-03-15 MED ORDER — IOPAMIDOL (ISOVUE-300) INJECTION 61%
INTRAVENOUS | Status: AC
  Administered 2016-03-15: 100 mL
  Filled 2016-03-15: qty 100

## 2016-03-15 MED ORDER — OXYCODONE-ACETAMINOPHEN 10-325 MG PO TABS: 1.0000 | ORAL_TABLET | ORAL | 0 refills | Status: DC | PRN

## 2016-03-15 NOTE — ED Triage Notes (Signed)
Per EMS, pt was moving from toilet to wheelchair and hit his right side on the sink. Pt c/o right sided rib pain. BP-128/90, HR-80, SpO2-97 ra.

## 2016-03-15 NOTE — Discharge Planning (Signed)
EDCM contacted regarding insurance and medication assistance.  EDCM researched the chart to find that pt has active Medicare and Medicaid.  Will advise pt to contact number on Medicare care to find PCP availability in this area.

## 2016-03-15 NOTE — ED Provider Notes (Signed)
Complains of left-sided rib pain and left-sided upper abdominal pain after he fell from his wheelchair hitting his ribs against a sink earlier this morning. No other injury No other associated symptoms. On exam alert Glasgow Coma Score 15 chest is tender at left anterior ribs. No ecchymosis no crepitance or flail. Lungs clear auscultation abdomen midline surgical scar. Tender at left upper quadrant   Doug SouSam , MD 03/15/16 [phone]

## 2016-03-15 NOTE — Discharge Instructions (Signed)
Take percocet for breakthrough pain, do not drink alcohol, drive, care for children or do other critical tasks while taking percocet.   It is very important that you take deep breaths to prevent lung collapse and infection.  Either use your incentive spirometer or take 10 deep breaths every hour to prevent lung collapse.  If you develop cough, fever or shortness of breath return immediately to the emergency room.   Do not hesitate to return to the emergency room for any new, worsening or concerning symptoms.  Please obtain primary care using resource guide below. Let them know that you were seen in the emergency room and that they will need to obtain records for further outpatient management.

## 2016-03-15 NOTE — Progress Notes (Signed)
Incentive spirometer given to patient.  Patient performed, with good effort, and achieved a goal of 750.  Instructed patient to keep working with it every hour.  No complications.
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
## 2016-05-03 ENCOUNTER — Emergency Department (HOSPITAL_COMMUNITY)
Admission: EM | Admit: 2016-05-03 | Discharge: 2016-05-03 | Disposition: A | Discharge status: Home / Self Care | Payer: Medicare Other | Attending: Emergency Medicine | Admitting: Emergency Medicine

## 2016-05-03 ENCOUNTER — Encounter (HOSPITAL_COMMUNITY): Payer: Self-pay | Admitting: *Deleted

## 2016-05-03 ENCOUNTER — Emergency Department (HOSPITAL_COMMUNITY): Payer: Medicare Other

## 2016-05-03 DIAGNOSIS — R109 Unspecified abdominal pain: Secondary | ICD-10-CM | POA: Insufficient documentation

## 2016-05-03 DIAGNOSIS — S20212A Contusion of left front wall of thorax, initial encounter: Secondary | ICD-10-CM | POA: Diagnosis not present

## 2016-05-03 DIAGNOSIS — Y939 Activity, unspecified: Secondary | ICD-10-CM | POA: Insufficient documentation

## 2016-05-03 DIAGNOSIS — W1839XA Other fall on same level, initial encounter: Secondary | ICD-10-CM | POA: Diagnosis not present

## 2016-05-03 DIAGNOSIS — Y929 Unspecified place or not applicable: Secondary | ICD-10-CM | POA: Insufficient documentation

## 2016-05-03 DIAGNOSIS — Y999 Unspecified external cause status: Secondary | ICD-10-CM | POA: Diagnosis not present

## 2016-05-03 DIAGNOSIS — S0990XA Unspecified injury of head, initial encounter: Secondary | ICD-10-CM | POA: Diagnosis not present

## 2016-05-03 DIAGNOSIS — S299XXA Unspecified injury of thorax, initial encounter: Secondary | ICD-10-CM | POA: Diagnosis present

## 2016-05-03 DIAGNOSIS — W19XXXA Unspecified fall, initial encounter: Secondary | ICD-10-CM

## 2016-05-03 LAB — CDS SEROLOGY

## 2016-05-03 LAB — COMPREHENSIVE METABOLIC PANEL
ALK PHOS: 90 U/L (ref 38–126)
ALT: 40 U/L (ref 17–63)
AST: 34 U/L (ref 15–41)
Albumin: 3.8 g/dL (ref 3.5–5.0)
Anion gap: 12 (ref 5–15)
BUN: 16 mg/dL (ref 6–20)
CALCIUM: 9.3 mg/dL (ref 8.9–10.3)
CO2: 20 mmol/L — ABNORMAL LOW (ref 22–32)
CREATININE: 0.9 mg/dL (ref 0.61–1.24)
Chloride: 104 mmol/L (ref 101–111)
Glucose, Bld: 78 mg/dL (ref 65–99)
Potassium: 3.9 mmol/L (ref 3.5–5.1)
Sodium: 136 mmol/L (ref 135–145)
Total Bilirubin: 0.4 mg/dL (ref 0.3–1.2)
Total Protein: 7.5 g/dL (ref 6.5–8.1)

## 2016-05-03 LAB — CBC
HCT: 39.2 % (ref 39.0–52.0)
Hemoglobin: 13.7 g/dL (ref 13.0–17.0)
MCH: 29.1 pg (ref 26.0–34.0)
MCHC: 34.9 g/dL (ref 30.0–36.0)
MCV: 83.2 fL (ref 78.0–100.0)
PLATELETS: 357 10*3/uL (ref 150–400)
RBC: 4.71 MIL/uL (ref 4.22–5.81)
RDW: 12.7 % (ref 11.5–15.5)
WBC: 13.7 10*3/uL — AB (ref 4.0–10.5)

## 2016-05-03 LAB — I-STAT CHEM 8, ED
BUN: 21 mg/dL — AB (ref 6–20)
CHLORIDE: 108 mmol/L (ref 101–111)
CREATININE: 1 mg/dL (ref 0.61–1.24)
Calcium, Ion: 1.1 mmol/L — ABNORMAL LOW (ref 1.15–1.40)
GLUCOSE: 76 mg/dL (ref 65–99)
HCT: 41 % (ref 39.0–52.0)
Hemoglobin: 13.9 g/dL (ref 13.0–17.0)
POTASSIUM: 4.4 mmol/L (ref 3.5–5.1)
Sodium: 138 mmol/L (ref 135–145)
TCO2: 23 mmol/L (ref 0–100)

## 2016-05-03 LAB — URINALYSIS, ROUTINE W REFLEX MICROSCOPIC
BILIRUBIN URINE: NEGATIVE
GLUCOSE, UA: NEGATIVE mg/dL
Hgb urine dipstick: NEGATIVE
KETONES UR: NEGATIVE mg/dL
LEUKOCYTES UA: NEGATIVE
NITRITE: NEGATIVE
PROTEIN: NEGATIVE mg/dL
Specific Gravity, Urine: <1.005 — ABNORMAL LOW (ref 1.005–1.030)
pH: 5.5 (ref 5.0–8.0)

## 2016-05-03 LAB — ETHANOL

## 2016-05-03 LAB — PROTIME-INR
INR: 0.95
Prothrombin Time: 12.6 seconds (ref 11.4–15.2)

## 2016-05-03 LAB — SAMPLE TO BLOOD BANK

## 2016-05-03 LAB — I-STAT CG4 LACTIC ACID, ED: Lactic Acid, Venous: 1.22 mmol/L (ref 0.5–1.9)

## 2016-05-03 MED ORDER — OXYCODONE-ACETAMINOPHEN 5-325 MG PO TABS: 1.0000 | ORAL_TABLET | Freq: Four times a day (QID) | ORAL | 0 refills | Status: DC | PRN

## 2016-05-03 MED ORDER — HYDROCODONE-ACETAMINOPHEN 5-325 MG PO TABS
1.0000 | ORAL_TABLET | Freq: Once | ORAL | Status: AC
  Administered 2016-05-03: 1 via ORAL
  Filled 2016-05-03: qty 1

## 2016-05-03 MED ORDER — IOPAMIDOL (ISOVUE-300) INJECTION 61%
INTRAVENOUS | Status: AC
  Administered 2016-05-03: 100 mL
  Filled 2016-05-03: qty 100

## 2016-05-03 MED ORDER — HYDROMORPHONE HCL 2 MG/ML IJ SOLN
0.5000 mg | Freq: Once | INTRAMUSCULAR | Status: AC
  Administered 2016-05-03: 0.5 mg via INTRAVENOUS
  Filled 2016-05-03: qty 1

## 2016-05-03 MED ORDER — MORPHINE SULFATE (PF) 4 MG/ML IV SOLN
4.0000 mg | Freq: Once | INTRAVENOUS | Status: AC
  Administered 2016-05-03: 4 mg via INTRAVENOUS
  Filled 2016-05-03: qty 1
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
## 2016-05-03 NOTE — ED Notes (Signed)
Nurse will get labs 
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
## 2016-05-03 NOTE — Discharge Instructions (Signed)
You were seen today for a fall. He had extensive imaging and likely only have a contusion to chest wall. Her workup was otherwise unremarkable. You did have old left rib fractures likely related to your fall in September. Given your acute injury, he will be given a very short course of pain medication. You need to contact your primary physician for any further pain medications.

## 2016-08-01 ENCOUNTER — Emergency Department (HOSPITAL_COMMUNITY): Payer: Medicare Other

## 2016-08-01 ENCOUNTER — Emergency Department (HOSPITAL_COMMUNITY)
Admission: EM | Admit: 2016-08-01 | Discharge: 2016-08-01 | Disposition: A | Discharge status: Home / Self Care | Payer: Medicare Other | Attending: Emergency Medicine | Admitting: Emergency Medicine

## 2016-08-01 ENCOUNTER — Encounter (HOSPITAL_COMMUNITY): Payer: Self-pay | Admitting: Emergency Medicine

## 2016-08-01 DIAGNOSIS — K439 Ventral hernia without obstruction or gangrene: Secondary | ICD-10-CM

## 2016-08-01 DIAGNOSIS — R1011 Right upper quadrant pain: Secondary | ICD-10-CM | POA: Diagnosis present

## 2016-08-01 LAB — CBC WITH DIFFERENTIAL/PLATELET
BASOS ABS: 0.1 10*3/uL (ref 0.0–0.1)
BASOS PCT: 0 %
EOS ABS: 0.3 10*3/uL (ref 0.0–0.7)
EOS PCT: 2 %
HCT: 44.5 % (ref 39.0–52.0)
Hemoglobin: 15.2 g/dL (ref 13.0–17.0)
Lymphocytes Relative: 26 %
Lymphs Abs: 3.7 10*3/uL (ref 0.7–4.0)
MCH: 28.4 pg (ref 26.0–34.0)
MCHC: 34.2 g/dL (ref 30.0–36.0)
MCV: 83.2 fL (ref 78.0–100.0)
MONO ABS: 0.9 10*3/uL (ref 0.1–1.0)
Monocytes Relative: 7 %
Neutro Abs: 9.3 10*3/uL — ABNORMAL HIGH (ref 1.7–7.7)
Neutrophils Relative %: 65 %
Platelets: 512 10*3/uL — ABNORMAL HIGH (ref 150–400)
RBC: 5.35 MIL/uL (ref 4.22–5.81)
RDW: 12.8 % (ref 11.5–15.5)
WBC: 14.2 10*3/uL — ABNORMAL HIGH (ref 4.0–10.5)

## 2016-08-01 LAB — URINALYSIS, ROUTINE W REFLEX MICROSCOPIC
Bilirubin Urine: NEGATIVE
GLUCOSE, UA: NEGATIVE mg/dL
HGB URINE DIPSTICK: NEGATIVE
Ketones, ur: NEGATIVE mg/dL
LEUKOCYTES UA: NEGATIVE
Nitrite: NEGATIVE
PROTEIN: NEGATIVE mg/dL
Specific Gravity, Urine: >1.046 — ABNORMAL HIGH (ref 1.005–1.030)
pH: 5 (ref 5.0–8.0)

## 2016-08-01 LAB — COMPREHENSIVE METABOLIC PANEL
ALBUMIN: 4.5 g/dL (ref 3.5–5.0)
ALT: 25 U/L (ref 17–63)
AST: 24 U/L (ref 15–41)
Alkaline Phosphatase: 73 U/L (ref 38–126)
Anion gap: 10 (ref 5–15)
BUN: 18 mg/dL (ref 6–20)
CHLORIDE: 103 mmol/L (ref 101–111)
CO2: 24 mmol/L (ref 22–32)
Calcium: 9.9 mg/dL (ref 8.9–10.3)
Creatinine, Ser: 1.08 mg/dL (ref 0.61–1.24)
GFR calc Af Amer: >60 mL/min (ref >60)
GFR calc non Af Amer: >60 mL/min (ref >60)
GLUCOSE: 102 mg/dL — AB (ref 65–99)
POTASSIUM: 3.9 mmol/L (ref 3.5–5.1)
Sodium: 137 mmol/L (ref 135–145)
Total Bilirubin: 0.4 mg/dL (ref 0.3–1.2)
Total Protein: 8.7 g/dL — ABNORMAL HIGH (ref 6.5–8.1)

## 2016-08-01 MED ORDER — HYDROCODONE-ACETAMINOPHEN 5-325 MG PO TABS: 1.0000 | ORAL_TABLET | Freq: Four times a day (QID) | ORAL | 0 refills | Status: DC | PRN

## 2016-08-01 MED ORDER — IOPAMIDOL (ISOVUE-300) INJECTION 61%
INTRAVENOUS | Status: AC
  Administered 2016-08-01: 100 mL
  Filled 2016-08-01: qty 100

## 2016-08-01 MED ORDER — MORPHINE SULFATE (PF) 4 MG/ML IV SOLN
4.0000 mg | Freq: Once | INTRAVENOUS | Status: AC
  Administered 2016-08-01: 4 mg via INTRAVENOUS
  Filled 2016-08-01: qty 1

## 2016-08-01 MED ORDER — LORAZEPAM 1 MG PO TABS
1.0000 mg | ORAL_TABLET | Freq: Once | ORAL | Status: DC
  Filled 2016-08-01: qty 1

## 2016-08-01 MED ORDER — LORAZEPAM 2 MG/ML IJ SOLN
1.0000 mg | Freq: Once | INTRAMUSCULAR | Status: AC
  Administered 2016-08-01: 1 mg via INTRAVENOUS
  Filled 2016-08-01: qty 1

## 2016-08-01 MED ORDER — ONDANSETRON HCL 4 MG/2ML IJ SOLN
4.0000 mg | Freq: Once | INTRAMUSCULAR | Status: AC
  Administered 2016-08-01: 4 mg via INTRAVENOUS
  Filled 2016-08-01: qty 2

## 2016-08-01 NOTE — ED Notes (Signed)
Secretary calling [facility] for transport. 

## 2016-08-01 NOTE — ED Notes (Signed)
MD Delo at the bedside.  
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
## 2016-08-01 NOTE — ED Notes (Signed)
Pt being taken to CT.

## 2016-08-01 NOTE — ED Triage Notes (Signed)
Pt. arrived with EMS from home reports painful lump at upper abdomen onset yesterday , pain increases with palpation , denies injury or fall , no emesis or diarrhea , he received Fentanyl 200 mcg IV by EMS prior to arrival with relief.

## 2016-08-01 NOTE — ED Notes (Signed)
Pt returned from CT. Placed back on the monitor. Given medication by Gladstone LighterAlecia, RN

## 2016-08-01 NOTE — ED Notes (Signed)
Pt made aware of the need for the Urine Sample. Verbalizes understanding.

## 2016-08-01 NOTE — Discharge Instructions (Signed)
Hydrocodone as prescribed as needed for pain.  You're to follow-up with [facility] in the next 1-2 weeks. Their contact information has been provided in this discharge summary for you to call and make these arrangements.  Return to the emergency department if you develop worsening pain, high fevers, vomiting, or other new and concerning symptoms.

## 2016-08-01 NOTE — ED Notes (Signed)
Wasted 1 mg of Ativan IV with Dorisann FramesAmanda Jones RN

## 2016-08-01 NOTE — ED Notes (Signed)
[facility] arrived.  

## 2016-09-10 ENCOUNTER — Ambulatory Visit: Payer: Medicare Other | Attending: Family Medicine | Admitting: Family Medicine

## 2016-09-10 ENCOUNTER — Encounter: Payer: Self-pay | Admitting: Family Medicine

## 2016-09-10 DIAGNOSIS — G8254 Quadriplegia, C5-C7 incomplete: Secondary | ICD-10-CM | POA: Insufficient documentation

## 2016-09-10 DIAGNOSIS — Z9081 Acquired absence of spleen: Secondary | ICD-10-CM | POA: Diagnosis not present

## 2016-09-10 DIAGNOSIS — Z993 Dependence on wheelchair: Secondary | ICD-10-CM | POA: Diagnosis not present

## 2016-09-10 NOTE — Progress Notes (Signed)
Patient is here to establish care  Patient complains of generalized body burning scaled currently at an 8.  Patient has not taken medication today. Patient has not eaten today.
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
## 2016-10-17 ENCOUNTER — Encounter (HOSPITAL_COMMUNITY): Payer: Self-pay | Admitting: Emergency Medicine

## 2016-10-17 ENCOUNTER — Emergency Department (HOSPITAL_COMMUNITY)
Admission: EM | Admit: 2016-10-17 | Discharge: 2016-10-18 | Disposition: A | Discharge status: Home / Self Care | Payer: Medicare Other | Attending: Emergency Medicine | Admitting: Emergency Medicine

## 2016-10-17 DIAGNOSIS — M791 Myalgia, unspecified site: Secondary | ICD-10-CM

## 2016-10-17 DIAGNOSIS — Z79899 Other long term (current) drug therapy: Secondary | ICD-10-CM | POA: Diagnosis not present

## 2016-10-17 LAB — I-STAT CHEM 8, ED
BUN: 21 mg/dL — ABNORMAL HIGH (ref 6–20)
Calcium, Ion: 1.16 mmol/L (ref 1.15–1.40)
Chloride: 107 mmol/L (ref 101–111)
Creatinine, Ser: 1.1 mg/dL (ref 0.61–1.24)
Glucose, Bld: 95 mg/dL (ref 65–99)
HEMATOCRIT: 43 % (ref 39.0–52.0)
HEMOGLOBIN: 14.6 g/dL (ref 13.0–17.0)
POTASSIUM: 4.2 mmol/L (ref 3.5–5.1)
SODIUM: 138 mmol/L (ref 135–145)
TCO2: 24 mmol/L (ref 0–100)

## 2016-10-17 MED ORDER — METHOCARBAMOL 500 MG PO TABS
1000.0000 mg | ORAL_TABLET | Freq: Once | ORAL | Status: AC
  Administered 2016-10-17: 1000 mg via ORAL
  Filled 2016-10-17: qty 2
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
## 2016-10-18 MED ORDER — METHOCARBAMOL 500 MG PO TABS: 1000.0000 mg | ORAL_TABLET | Freq: Three times a day (TID) | ORAL | 0 refills | Status: DC | PRN

## 2017-01-24 ENCOUNTER — Ambulatory Visit: Payer: Medicare Other | Admitting: Family Medicine

## 2017-03-06 ENCOUNTER — Encounter (HOSPITAL_COMMUNITY): Payer: Self-pay | Admitting: *Deleted

## 2017-03-06 ENCOUNTER — Emergency Department (HOSPITAL_COMMUNITY)
Admission: EM | Admit: 2017-03-06 | Discharge: 2017-03-06 | Disposition: A | Discharge status: Home / Self Care | Payer: Medicare Other | Attending: Emergency Medicine | Admitting: Emergency Medicine

## 2017-03-06 DIAGNOSIS — M7701 Medial epicondylitis, right elbow: Secondary | ICD-10-CM

## 2017-03-06 DIAGNOSIS — S4991XA Unspecified injury of right shoulder and upper arm, initial encounter: Secondary | ICD-10-CM | POA: Diagnosis present

## 2017-03-06 DIAGNOSIS — Y9389 Activity, other specified: Secondary | ICD-10-CM | POA: Insufficient documentation

## 2017-03-06 DIAGNOSIS — Z79899 Other long term (current) drug therapy: Secondary | ICD-10-CM | POA: Diagnosis not present

## 2017-03-06 DIAGNOSIS — X58XXXA Exposure to other specified factors, initial encounter: Secondary | ICD-10-CM | POA: Insufficient documentation

## 2017-03-06 DIAGNOSIS — Y999 Unspecified external cause status: Secondary | ICD-10-CM | POA: Diagnosis not present

## 2017-03-06 DIAGNOSIS — Y929 Unspecified place or not applicable: Secondary | ICD-10-CM | POA: Diagnosis not present

## 2017-03-06 MED ORDER — PREDNISONE 10 MG PO TABS: 40.0000 mg | ORAL_TABLET | Freq: Every day | ORAL | 0 refills | Status: AC

## 2017-03-06 MED ORDER — NAPROXEN 500 MG PO TABS: 500.0000 mg | ORAL_TABLET | Freq: Two times a day (BID) | ORAL | 0 refills | Status: DC

## 2017-03-06 MED ORDER — HYDROCODONE-ACETAMINOPHEN 5-325 MG PO TABS
1.0000 | ORAL_TABLET | Freq: Once | ORAL | Status: AC
  Administered 2017-03-06: 1 via ORAL
  Filled 2017-03-06: qty 1

## 2017-03-06 MED ORDER — DEXAMETHASONE SODIUM PHOSPHATE 10 MG/ML IJ SOLN
10.0000 mg | Freq: Once | INTRAMUSCULAR | Status: AC
  Administered 2017-03-06: 10 mg via INTRAMUSCULAR
  Filled 2017-03-06: qty 1

## 2017-03-06 MED ORDER — RANITIDINE HCL 150 MG PO CAPS: 150.0000 mg | ORAL_CAPSULE | Freq: Every day | ORAL | 0 refills | Status: DC

## 2017-03-06 MED ORDER — IBUPROFEN 800 MG PO TABS
800.0000 mg | ORAL_TABLET | Freq: Once | ORAL | Status: AC
  Administered 2017-03-06: 800 mg via ORAL
  Filled 2017-03-06: qty 1

## 2017-03-06 NOTE — ED Triage Notes (Signed)
The pt is c/o  Pain in his rt elbow for 2-3 days.  He is a paraplegic and travels in a wheelchair  No known injury

## 2017-03-06 NOTE — ED Provider Notes (Signed)
Medical screening examination/treatment/procedure(s) were conducted as a shared visit with non-physician practitioner(s) and myself.  I personally evaluated the patient during the encounter.   EKG Interpretation None     Patient was medial right elbow pain for 3-4 days. No associated injury. Patient uses his arm very actively for wheelchair mobility. Focal tenderness on the medial upper condyle without joint effusion. Patient otherwise neurovascularly intact. Agree with plan of management.   Arby Barrette, MD 03/06/17 2045

## 2017-03-06 NOTE — Discharge Instructions (Signed)
Take naproxen twice a day as scheduled with meals. Do not take other anti-inflammatories at the same time (Advil, Motrin, ibuprofen, Aleve). You may supplement with Tylenol as needed for further pain control. Take steroids as prescribed. Take Zantac while taking both anti-inflammatory and steroids. Ice your elbow for 20 minutes at a time, multiple times throughout the day. Follow-up with your primary care doctor in 1 week if pain is not improving. Return to the emergency room if you develop fevers, chills, numbness, tingling, or any new or worsening symptoms.
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
## 2017-06-02 ENCOUNTER — Encounter (HOSPITAL_COMMUNITY): Payer: Self-pay

## 2017-06-02 ENCOUNTER — Emergency Department (HOSPITAL_COMMUNITY)
Admission: EM | Admit: 2017-06-02 | Discharge: 2017-06-03 | Disposition: A | Discharge status: Home / Self Care | Payer: Medicare HMO | Attending: Emergency Medicine | Admitting: Emergency Medicine

## 2017-06-02 ENCOUNTER — Emergency Department (HOSPITAL_COMMUNITY): Payer: Medicare HMO

## 2017-06-02 DIAGNOSIS — R0602 Shortness of breath: Secondary | ICD-10-CM | POA: Insufficient documentation

## 2017-06-02 DIAGNOSIS — Y9389 Activity, other specified: Secondary | ICD-10-CM | POA: Insufficient documentation

## 2017-06-02 DIAGNOSIS — S2232XA Fracture of one rib, left side, initial encounter for closed fracture: Secondary | ICD-10-CM | POA: Diagnosis not present

## 2017-06-02 DIAGNOSIS — S20302A Unspecified superficial injuries of left front wall of thorax, initial encounter: Secondary | ICD-10-CM | POA: Diagnosis present

## 2017-06-02 DIAGNOSIS — G8254 Quadriplegia, C5-C7 incomplete: Secondary | ICD-10-CM | POA: Insufficient documentation

## 2017-06-02 DIAGNOSIS — Y999 Unspecified external cause status: Secondary | ICD-10-CM | POA: Insufficient documentation

## 2017-06-02 DIAGNOSIS — Y929 Unspecified place or not applicable: Secondary | ICD-10-CM | POA: Insufficient documentation

## 2017-06-02 DIAGNOSIS — W07XXXA Fall from chair, initial encounter: Secondary | ICD-10-CM | POA: Diagnosis not present

## 2017-06-02 MED ORDER — DIAZEPAM 5 MG PO TABS
5.0000 mg | ORAL_TABLET | Freq: Once | ORAL | Status: AC
  Administered 2017-06-02: 5 mg via ORAL
  Filled 2017-06-02: qty 1

## 2017-06-02 MED ORDER — KETOROLAC TROMETHAMINE 60 MG/2ML IM SOLN
15.0000 mg | Freq: Once | INTRAMUSCULAR | Status: AC
  Administered 2017-06-02: 15 mg via INTRAMUSCULAR
  Filled 2017-06-02: qty 2

## 2017-06-02 MED ORDER — OXYCODONE HCL 5 MG PO TABS
5.0000 mg | ORAL_TABLET | Freq: Once | ORAL | Status: AC
Start: 2017-06-02 — End: 2017-06-02
  Administered 2017-06-02: 5 mg via ORAL
  Filled 2017-06-02: qty 1

## 2017-06-02 MED ORDER — MORPHINE SULFATE 15 MG PO TABS: 15.0000 mg | ORAL_TABLET | ORAL | 0 refills | Status: DC | PRN

## 2017-06-02 MED ORDER — ACETAMINOPHEN 500 MG PO TABS
1000.0000 mg | ORAL_TABLET | Freq: Once | ORAL | Status: AC
  Administered 2017-06-02: 1000 mg via ORAL
  Filled 2017-06-02: qty 2

## 2017-06-02 NOTE — ED Notes (Signed)
Pt IV was placed by ems. Due to pain in the iv site, IV was taken out.
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
## 2017-06-02 NOTE — Discharge Instructions (Signed)
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
## 2017-06-03 DIAGNOSIS — S2232XA Fracture of one rib, left side, initial encounter for closed fracture: Secondary | ICD-10-CM | POA: Diagnosis not present

## 2017-06-03 MED ORDER — NAPROXEN 500 MG PO TABS: 500.0000 mg | ORAL_TABLET | Freq: Two times a day (BID) | ORAL | 0 refills | Status: DC

## 2017-06-03 MED ORDER — OXYCODONE HCL 5 MG PO TABS
10.0000 mg | ORAL_TABLET | ORAL | Status: DC | PRN
  Administered 2017-06-03: 10 mg via ORAL
  Filled 2017-06-03: qty 2

## 2017-06-03 NOTE — ED Notes (Signed)
Pt departed in NAD, and in care of [facility].  

## 2017-07-24 ENCOUNTER — Emergency Department (HOSPITAL_COMMUNITY): Payer: Medicare HMO

## 2017-07-24 ENCOUNTER — Emergency Department (HOSPITAL_COMMUNITY)
Admission: EM | Admit: 2017-07-24 | Discharge: 2017-07-24 | Disposition: A | Discharge status: Home / Self Care | Payer: Medicare HMO | Attending: Emergency Medicine | Admitting: Emergency Medicine

## 2017-07-24 ENCOUNTER — Other Ambulatory Visit: Payer: Self-pay

## 2017-07-24 ENCOUNTER — Encounter (HOSPITAL_COMMUNITY): Payer: Self-pay | Admitting: *Deleted

## 2017-07-24 DIAGNOSIS — S060X0A Concussion without loss of consciousness, initial encounter: Secondary | ICD-10-CM | POA: Diagnosis not present

## 2017-07-24 DIAGNOSIS — S0990XA Unspecified injury of head, initial encounter: Secondary | ICD-10-CM | POA: Diagnosis present

## 2017-07-24 DIAGNOSIS — Y92002 Bathroom of unspecified non-institutional (private) residence single-family (private) house as the place of occurrence of the external cause: Secondary | ICD-10-CM | POA: Insufficient documentation

## 2017-07-24 DIAGNOSIS — W228XXA Striking against or struck by other objects, initial encounter: Secondary | ICD-10-CM | POA: Diagnosis not present

## 2017-07-24 DIAGNOSIS — Y998 Other external cause status: Secondary | ICD-10-CM | POA: Diagnosis not present

## 2017-07-24 DIAGNOSIS — W050XXA Fall from non-moving wheelchair, initial encounter: Secondary | ICD-10-CM | POA: Insufficient documentation

## 2017-07-24 DIAGNOSIS — S0003XA Contusion of scalp, initial encounter: Secondary | ICD-10-CM | POA: Diagnosis not present

## 2017-07-24 DIAGNOSIS — Z79899 Other long term (current) drug therapy: Secondary | ICD-10-CM | POA: Diagnosis not present

## 2017-07-24 DIAGNOSIS — R51 Headache: Secondary | ICD-10-CM | POA: Insufficient documentation

## 2017-07-24 DIAGNOSIS — Z23 Encounter for immunization: Secondary | ICD-10-CM | POA: Diagnosis not present

## 2017-07-24 DIAGNOSIS — Y9389 Activity, other specified: Secondary | ICD-10-CM | POA: Diagnosis not present

## 2017-07-24 DIAGNOSIS — S161XXA Strain of muscle, fascia and tendon at neck level, initial encounter: Secondary | ICD-10-CM | POA: Insufficient documentation

## 2017-07-24 DIAGNOSIS — G8254 Quadriplegia, C5-C7 incomplete: Secondary | ICD-10-CM | POA: Insufficient documentation

## 2017-07-24 DIAGNOSIS — S298XXA Other specified injuries of thorax, initial encounter: Secondary | ICD-10-CM | POA: Diagnosis not present

## 2017-07-24 MED ORDER — DIPHENHYDRAMINE HCL 50 MG/ML IJ SOLN
25.0000 mg | Freq: Once | INTRAMUSCULAR | Status: AC
  Administered 2017-07-24: 25 mg via INTRAVENOUS
  Filled 2017-07-24: qty 1

## 2017-07-24 MED ORDER — METOCLOPRAMIDE HCL 5 MG/ML IJ SOLN
10.0000 mg | Freq: Once | INTRAMUSCULAR | Status: AC
  Administered 2017-07-24: 10 mg via INTRAVENOUS
  Filled 2017-07-24: qty 2

## 2017-07-24 MED ORDER — HYDROMORPHONE HCL 1 MG/ML IJ SOLN
1.0000 mg | Freq: Once | INTRAMUSCULAR | Status: AC
  Administered 2017-07-24: 1 mg via INTRAVENOUS
  Filled 2017-07-24: qty 1

## 2017-07-24 MED ORDER — TETANUS-DIPHTH-ACELL PERTUSSIS 5-2.5-18.5 LF-MCG/0.5 IM SUSP
0.5000 mL | Freq: Once | INTRAMUSCULAR | Status: AC
  Administered 2017-07-24: 0.5 mL via INTRAMUSCULAR
  Filled 2017-07-24: qty 0.5

## 2017-07-24 NOTE — ED Notes (Signed)
Notified [facility] for transportation back home 

## 2017-07-24 NOTE — ED Triage Notes (Signed)
According to his old chart the pt is an incomplete quadraplegic instead of a paraplegic as reported
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
## 2017-07-24 NOTE — ED Notes (Signed)
Pt ao x 4.  Sitting up in bed eating breakfast.

## 2017-07-24 NOTE — ED Notes (Signed)
ED Provider at bedside. 
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
## 2017-07-24 NOTE — ED Notes (Signed)
Patient transported to CT 

## 2017-07-25 ENCOUNTER — Emergency Department (HOSPITAL_COMMUNITY)
Admission: EM | Admit: 2017-07-25 | Discharge: 2017-07-26 | Disposition: A | Discharge status: Home / Self Care | Payer: Medicare HMO | Attending: Emergency Medicine | Admitting: Emergency Medicine

## 2017-07-25 ENCOUNTER — Encounter (HOSPITAL_COMMUNITY): Payer: Self-pay

## 2017-07-25 DIAGNOSIS — S0001XA Abrasion of scalp, initial encounter: Secondary | ICD-10-CM | POA: Diagnosis not present

## 2017-07-25 DIAGNOSIS — Y998 Other external cause status: Secondary | ICD-10-CM | POA: Insufficient documentation

## 2017-07-25 DIAGNOSIS — S20219A Contusion of unspecified front wall of thorax, initial encounter: Secondary | ICD-10-CM | POA: Diagnosis not present

## 2017-07-25 DIAGNOSIS — Y939 Activity, unspecified: Secondary | ICD-10-CM | POA: Insufficient documentation

## 2017-07-25 DIAGNOSIS — S0990XA Unspecified injury of head, initial encounter: Secondary | ICD-10-CM | POA: Diagnosis present

## 2017-07-25 DIAGNOSIS — Y929 Unspecified place or not applicable: Secondary | ICD-10-CM | POA: Insufficient documentation
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
## 2017-07-26 ENCOUNTER — Emergency Department (HOSPITAL_COMMUNITY): Payer: Medicare HMO

## 2017-07-26 DIAGNOSIS — S0001XA Abrasion of scalp, initial encounter: Secondary | ICD-10-CM | POA: Diagnosis not present

## 2017-07-26 MED ORDER — OXYCODONE-ACETAMINOPHEN 5-325 MG PO TABS
1.0000 | ORAL_TABLET | Freq: Once | ORAL | Status: AC
  Administered 2017-07-26: 1 via ORAL
  Filled 2017-07-26: qty 1

## 2017-07-26 MED ORDER — HYDROGEN PEROXIDE 3 % EX SOLN
CUTANEOUS | Status: AC
  Filled 2017-07-26: qty 473

## 2017-07-26 MED ORDER — NAPROXEN 500 MG PO TABS: 500.0000 mg | ORAL_TABLET | Freq: Two times a day (BID) | ORAL | 0 refills | Status: DC

## 2017-07-26 MED ORDER — MORPHINE SULFATE (PF) 4 MG/ML IV SOLN
4.0000 mg | Freq: Once | INTRAVENOUS | Status: AC
  Administered 2017-07-26: 4 mg via INTRAVENOUS
  Filled 2017-07-26: qty 1
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
## 2018-08-25 ENCOUNTER — Encounter (HOSPITAL_COMMUNITY): Payer: Self-pay | Admitting: Emergency Medicine

## 2018-08-25 ENCOUNTER — Emergency Department (HOSPITAL_COMMUNITY)
Admission: EM | Admit: 2018-08-25 | Discharge: 2018-08-25 | Disposition: A | Discharge status: Home / Self Care | Payer: Medicare HMO | Attending: Emergency Medicine | Admitting: Emergency Medicine

## 2018-08-25 DIAGNOSIS — S20311A Abrasion of right front wall of thorax, initial encounter: Secondary | ICD-10-CM | POA: Insufficient documentation

## 2018-08-25 DIAGNOSIS — Y929 Unspecified place or not applicable: Secondary | ICD-10-CM | POA: Insufficient documentation

## 2018-08-25 DIAGNOSIS — Y998 Other external cause status: Secondary | ICD-10-CM | POA: Diagnosis not present

## 2018-08-25 DIAGNOSIS — S50811A Abrasion of right forearm, initial encounter: Secondary | ICD-10-CM | POA: Diagnosis not present

## 2018-08-25 DIAGNOSIS — L089 Local infection of the skin and subcutaneous tissue, unspecified: Secondary | ICD-10-CM | POA: Insufficient documentation

## 2018-08-25 DIAGNOSIS — X58XXXA Exposure to other specified factors, initial encounter: Secondary | ICD-10-CM | POA: Insufficient documentation

## 2018-08-25 DIAGNOSIS — Y9389 Activity, other specified: Secondary | ICD-10-CM | POA: Diagnosis not present

## 2018-08-25 DIAGNOSIS — Z79899 Other long term (current) drug therapy: Secondary | ICD-10-CM | POA: Diagnosis not present

## 2018-08-25 DIAGNOSIS — S50812A Abrasion of left forearm, initial encounter: Secondary | ICD-10-CM | POA: Diagnosis not present

## 2018-08-25 DIAGNOSIS — T148XXA Other injury of unspecified body region, initial encounter: Secondary | ICD-10-CM

## 2018-08-25 DIAGNOSIS — S70311A Abrasion, right thigh, initial encounter: Secondary | ICD-10-CM | POA: Diagnosis not present

## 2018-08-25 DIAGNOSIS — S299XXA Unspecified injury of thorax, initial encounter: Secondary | ICD-10-CM | POA: Diagnosis present

## 2018-08-25 MED ORDER — CEPHALEXIN 500 MG PO CAPS: ORAL_CAPSULE | ORAL | 0 refills | Status: DC

## 2018-08-25 MED ORDER — ACETAMINOPHEN 325 MG PO TABS
650.0000 mg | ORAL_TABLET | Freq: Once | ORAL | Status: AC
  Administered 2018-08-25: 650 mg via ORAL
  Filled 2018-08-25: qty 2

## 2018-08-25 MED ORDER — CEPHALEXIN 250 MG PO CAPS
1000.0000 mg | ORAL_CAPSULE | Freq: Once | ORAL | Status: AC
  Administered 2018-08-25: 1000 mg via ORAL
  Filled 2018-08-25: qty 4

## 2018-08-25 MED ORDER — SULFAMETHOXAZOLE-TRIMETHOPRIM 800-160 MG PO TABS: 1.0000 | ORAL_TABLET | Freq: Two times a day (BID) | ORAL | 0 refills | Status: DC

## 2018-08-25 MED ORDER — SULFAMETHOXAZOLE-TRIMETHOPRIM 800-160 MG PO TABS
1.0000 | ORAL_TABLET | Freq: Once | ORAL | Status: AC
Start: 2018-08-25 — End: 2018-08-25
  Administered 2018-08-25: 1 via ORAL
  Filled 2018-08-25: qty 1

## 2018-08-25 NOTE — ED Notes (Signed)
Called ptar for pt transport back to home.  

## 2018-08-25 NOTE — ED Notes (Signed)
IV removed from patient
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
## 2018-08-25 NOTE — Discharge Instructions (Addendum)
Sores on your body need to be cleaned multiple times each day with soap and water.  Also make sure you soak in a tub 2 or 3 times a day, to help the wounds heal.  Take the antibiotics as prescribed to treat your wounds.  Make sure you follow-up with your primary care doctor for further care and treatment within a week or so.

## 2018-08-25 NOTE — ED Triage Notes (Signed)
Pt here from home with c/o wounds to his torso head and arm , pt thinks it is mrsa , pt given fentanyl for  Pain by ems

## 2018-08-25 NOTE — ED Notes (Signed)
Pt given discharge instructions. Prescriptions and follow up information. Pt given the opportunity to ask questions. Pt verbalized understanding. Report given to PTAR.

## 2018-12-02 ENCOUNTER — Encounter (HOSPITAL_COMMUNITY): Payer: Self-pay

## 2018-12-02 ENCOUNTER — Other Ambulatory Visit: Payer: Self-pay

## 2018-12-02 ENCOUNTER — Emergency Department (HOSPITAL_COMMUNITY): Payer: Medicare HMO

## 2018-12-02 ENCOUNTER — Emergency Department (HOSPITAL_COMMUNITY)
Admission: EM | Admit: 2018-12-02 | Discharge: 2018-12-02 | Disposition: A | Discharge status: Home / Self Care | Payer: Medicare HMO | Attending: Emergency Medicine | Admitting: Emergency Medicine

## 2018-12-02 DIAGNOSIS — Y999 Unspecified external cause status: Secondary | ICD-10-CM | POA: Insufficient documentation

## 2018-12-02 DIAGNOSIS — S20219A Contusion of unspecified front wall of thorax, initial encounter: Secondary | ICD-10-CM | POA: Diagnosis not present

## 2018-12-02 DIAGNOSIS — S0990XA Unspecified injury of head, initial encounter: Secondary | ICD-10-CM | POA: Diagnosis present

## 2018-12-02 DIAGNOSIS — N433 Hydrocele, unspecified: Secondary | ICD-10-CM | POA: Insufficient documentation

## 2018-12-02 DIAGNOSIS — S3022XA Contusion of scrotum and testes, initial encounter: Secondary | ICD-10-CM | POA: Insufficient documentation

## 2018-12-02 DIAGNOSIS — S40011A Contusion of right shoulder, initial encounter: Secondary | ICD-10-CM | POA: Diagnosis not present

## 2018-12-02 DIAGNOSIS — Y939 Activity, unspecified: Secondary | ICD-10-CM | POA: Insufficient documentation

## 2018-12-02 DIAGNOSIS — S1093XA Contusion of unspecified part of neck, initial encounter: Secondary | ICD-10-CM

## 2018-12-02 DIAGNOSIS — Y929 Unspecified place or not applicable: Secondary | ICD-10-CM | POA: Insufficient documentation

## 2018-12-02 MED ORDER — OXYCODONE HCL 5 MG/5ML PO SOLN
15.0000 mg | Freq: Once | ORAL | Status: AC
  Administered 2018-12-02: 15 mg via ORAL
  Filled 2018-12-02: qty 15

## 2018-12-02 NOTE — Discharge Instructions (Signed)
You will need to follow up with the Urologist to discuss your testicular swelling. Take your normal medication and call your PCP for follow up. Return to the ED evaluation if symptoms worsen.

## 2018-12-02 NOTE — ED Notes (Signed)
Ultrasound at bedside

## 2018-12-02 NOTE — ED Notes (Signed)
Mark Porter is here and pt. D/c'd. Without incident. Dr. Jacqulyn Bath has instructed pt. To take his Valium as soon as he gets home.

## 2018-12-02 NOTE — ED Notes (Signed)
Pt refuses to take clothes off and change into hospital gown. NT explained he would need to change for provider to examine pt but pt still refuses.
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
## 2018-12-02 NOTE — ED Notes (Signed)
Patient transported to xray/CT. 
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
## 2018-12-02 NOTE — ED Provider Notes (Signed)
Blood pressure 113/78, pulse 99, temperature 97.9 F (36.6 C), temperature source Oral, resp. rate 18, height 5\' 7"  (1.702 m), weight 72.6 kg, SpO2 94 %.  Assuming care from Dr. Preston Fleeting.  In short, Mark Porter is a 53 y.o. male with a chief complaint of Neck Pain .  Refer to the original H&P for additional details.  The current plan of care is to follow up on imaging and reassess.  08:05 AM Imaging reviewed.  Patient with bilateral hydrocele on ultrasound.  No torsion.  CT imaging and plain films without acute findings.  Patient is on chronic pain medication.  Advised that he take this along with ice application to the affected areas and over the next 12 hours.  Provided contact information for urology to arrange follow-up in the coming week regarding his hydrocele.  Discussed this with the patient who is in agreement. Discussed ED return precautions.     Maia Plan, MD 12/02/18 [phone]

## 2018-12-03 ENCOUNTER — Encounter (HOSPITAL_COMMUNITY): Payer: Self-pay

## 2018-12-03 ENCOUNTER — Emergency Department (HOSPITAL_COMMUNITY): Payer: Medicare HMO

## 2018-12-03 ENCOUNTER — Other Ambulatory Visit: Payer: Self-pay

## 2018-12-03 ENCOUNTER — Emergency Department (HOSPITAL_COMMUNITY)
Admission: EM | Admit: 2018-12-03 | Discharge: 2018-12-03 | Disposition: A | Discharge status: Home / Self Care | Payer: Medicare HMO | Attending: Emergency Medicine | Admitting: Emergency Medicine

## 2018-12-03 DIAGNOSIS — M542 Cervicalgia: Secondary | ICD-10-CM | POA: Insufficient documentation

## 2018-12-03 DIAGNOSIS — R51 Headache: Secondary | ICD-10-CM | POA: Diagnosis not present

## 2018-12-03 DIAGNOSIS — Z79899 Other long term (current) drug therapy: Secondary | ICD-10-CM | POA: Insufficient documentation

## 2018-12-03 DIAGNOSIS — N5082 Scrotal pain: Secondary | ICD-10-CM

## 2018-12-03 LAB — CBC WITH DIFFERENTIAL/PLATELET
Abs Immature Granulocytes: 0.07 10*3/uL (ref 0.00–0.07)
Basophils Absolute: 0.1 10*3/uL (ref 0.0–0.1)
Basophils Relative: 1 %
Eosinophils Absolute: 0.4 10*3/uL (ref 0.0–0.5)
Eosinophils Relative: 3 %
HCT: 41.8 % (ref 39.0–52.0)
Hemoglobin: 13.9 g/dL (ref 13.0–17.0)
Immature Granulocytes: 1 %
Lymphocytes Relative: 14 %
Lymphs Abs: 2.2 10*3/uL (ref 0.7–4.0)
MCH: 27.8 pg (ref 26.0–34.0)
MCHC: 33.3 g/dL (ref 30.0–36.0)
MCV: 83.6 fL (ref 80.0–100.0)
Monocytes Absolute: 1.2 10*3/uL — ABNORMAL HIGH (ref 0.1–1.0)
Monocytes Relative: 8 %
Neutro Abs: 11.2 10*3/uL — ABNORMAL HIGH (ref 1.7–7.7)
Neutrophils Relative %: 73 %
Platelets: 591 10*3/uL — ABNORMAL HIGH (ref 150–400)
RBC: 5 MIL/uL (ref 4.22–5.81)
RDW: 13.2 % (ref 11.5–15.5)
WBC: 15.2 10*3/uL — ABNORMAL HIGH (ref 4.0–10.5)
nRBC: 0 % (ref 0.0–0.2)

## 2018-12-03 LAB — URINALYSIS, ROUTINE W REFLEX MICROSCOPIC
Bilirubin Urine: NEGATIVE
Glucose, UA: NEGATIVE mg/dL
Hgb urine dipstick: NEGATIVE
Ketones, ur: NEGATIVE mg/dL
Leukocytes,Ua: NEGATIVE
Nitrite: NEGATIVE
Protein, ur: NEGATIVE mg/dL
Specific Gravity, Urine: 1.004 — ABNORMAL LOW (ref 1.005–1.030)
pH: 7 (ref 5.0–8.0)

## 2018-12-03 LAB — BASIC METABOLIC PANEL
Anion gap: 5 (ref 5–15)
BUN: 9 mg/dL (ref 6–20)
CO2: 24 mmol/L (ref 22–32)
Calcium: 8.9 mg/dL (ref 8.9–10.3)
Chloride: 106 mmol/L (ref 98–111)
Creatinine, Ser: 1.08 mg/dL (ref 0.61–1.24)
GFR calc Af Amer: >60 mL/min (ref >60)
GFR calc non Af Amer: >60 mL/min (ref >60)
Glucose, Bld: 94 mg/dL (ref 70–99)
Potassium: 3.8 mmol/L (ref 3.5–5.1)
Sodium: 135 mmol/L (ref 135–145)

## 2018-12-03 MED ORDER — DOXYCYCLINE HYCLATE 100 MG PO CAPS: 100.0000 mg | ORAL_CAPSULE | Freq: Two times a day (BID) | ORAL | 0 refills | Status: DC

## 2018-12-03 MED ORDER — KETOROLAC TROMETHAMINE 30 MG/ML IJ SOLN
30.0000 mg | Freq: Once | INTRAMUSCULAR | Status: DC
  Filled 2018-12-03: qty 1

## 2018-12-03 MED ORDER — HYDROCODONE-ACETAMINOPHEN 5-325 MG PO TABS
1.0000 | ORAL_TABLET | Freq: Once | ORAL | Status: AC
  Administered 2018-12-03: 1 via ORAL
  Filled 2018-12-03: qty 1

## 2018-12-03 MED ORDER — MORPHINE SULFATE (PF) 4 MG/ML IV SOLN
4.0000 mg | Freq: Once | INTRAVENOUS | Status: DC
  Filled 2018-12-03: qty 1

## 2018-12-03 MED ORDER — KETOROLAC TROMETHAMINE 30 MG/ML IJ SOLN: 30.0000 mg | Freq: Once | INTRAMUSCULAR | Status: DC

## 2018-12-03 MED ORDER — KETOROLAC TROMETHAMINE 30 MG/ML IJ SOLN
30.0000 mg | Freq: Once | INTRAMUSCULAR | Status: AC
  Administered 2018-12-03: 30 mg via INTRAVENOUS
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
## 2018-12-03 NOTE — ED Notes (Signed)
Pt to ultrasound

## 2018-12-03 NOTE — ED Notes (Signed)
Pt returned from ultrasound

## 2018-12-03 NOTE — ED Notes (Signed)
RN attempted IV x2.  

## 2018-12-03 NOTE — ED Notes (Signed)
Ptar called for pt 

## 2018-12-03 NOTE — ED Notes (Signed)
[facility] will call for him when hes ready

## 2018-12-03 NOTE — ED Notes (Signed)
ptar called waiting for transport  Food given

## 2018-12-03 NOTE — ED Notes (Signed)
The pt reports that he is starting to hurt again.  He wants something for pain something to eat and he wants to talk to the pa again he has more questions

## 2018-12-03 NOTE — Discharge Instructions (Signed)
You have been seen today for scrotal pain. Please read and follow all provided instructions.   1. Medications: doxycyline (antibiotic), usual home medications 2. Treatment: rest, drink plenty of fluids 3. Follow Up: Please follow up with urology. Please follow up with your primary doctor in 2 days for discussion of your diagnoses and further evaluation after today's visit; if you do not have a primary care doctor use the resource guide provided to find one; Please return to the ER for any new or worsening symptoms. Please obtain all of your results from medical records or have your doctors office obtain the results - share them with your doctor - you should be seen at your doctors office. Call today to arrange your follow up.   Take medications as prescribed. Please review all of the medicines and only take them if you do not have an allergy to them. Return to the emergency room for worsening condition or new concerning symptoms. Follow up with your regular doctor. If you don't have a regular doctor use one of the numbers below to establish a primary care doctor.  Please be aware that if you are taking birth control pills, taking other prescriptions, ESPECIALLY ANTIBIOTICS may make the birth control ineffective - if this is the case, either do not engage in sexual activity or use alternative methods of birth control such as condoms until you have finished the medicine and your family doctor says it is OK to restart them. If you are on a blood thinner such as COUMADIN, be aware that any other medicine that you take may cause the coumadin to either work too much, or not enough - you should have your coumadin level rechecked in next 7 days if this is the case.  ?  It is also a possibility that you have an allergic reaction to any of the medicines that you have been prescribed - Everybody reacts differently to medications and while MOST people have no trouble with most medicines, you may have a reaction such  as nausea, vomiting, rash, swelling, shortness of breath. If this is the case, please stop taking the medicine immediately and contact your physician.  ?  You should return to the ER if you develop severe or worsening symptoms.   Emergency Department Resource Guide 1) Find a Doctor and Pay Out of Pocket Although you won't have to find out who is covered by your insurance plan, it is a good idea to ask around and get recommendations. You will then need to call the office and see if the doctor you have chosen will accept you as a new patient and what types of options they offer for patients who are self-pay. Some doctors offer discounts or will set up payment plans for their patients who do not have insurance, but you will need to ask so you aren't surprised when you get to your appointment.  2) Contact Your Local Health Department Not all health departments have doctors that can see patients for sick visits, but many do, so it is worth a call to see if yours does. If you don't know where your local health department is, you can check in your phone book. The CDC also has a tool to help you locate your state's health department, and many state websites also have listings of all of their local health departments.  3) Find a Walk-in Clinic If your illness is not likely to be very severe or complicated, you may want to try a walk in  clinic. These are popping up all over the country in pharmacies, drugstores, and shopping centers. They're usually staffed by nurse practitioners or physician assistants that have been trained to treat common illnesses and complaints. They're usually fairly quick and inexpensive. However, if you have serious medical issues or chronic medical problems, these are probably not your best option.  No Primary Care Doctor: Call Health Connect at  [phone] - they can help you locate a primary care doctor that  accepts your insurance, provides certain services, etc. Physician Referral  Service- [phone]  Chronic Pain Problems: Organization         Address  Phone   Notes  [facility]  [phone] Patients need to be referred by their primary care doctor.   Medication Assistance: Organization         Address  Phone   Notes  [facility] [address] [place], [place] [address] [phone] --Must be a resident of [facility] -- Must have NO insurance coverage whatsoever (no Medicaid/ Medicare, etc.) -- The pt. MUST have a primary care doctor that directs their care regularly and follows them in the community   [facility]  [phone]   [employer]  [phone]    Agencies that provide inexpensive medical care: Organization         Address  Phone   Notes  [place]  [phone]   [facility]    [phone]   [facility] [place],  [address] [phone]   [place] [address] [place] [phone]   [facility]    [phone]   [facility]    [phone]   [place] [address], [place] Phone:  [phone], Fax:  [phone] Hours of Operation:  9 am - 6 pm, M-F.  Also accepts Medicaid/Medicare and self-pay.  [facility] for [address], [address], [place] Phone: [phone], Fax: [phone]. Hours of Operation:  8:30 am - 5:30 pm, M-F.  Also accepts Medicaid and self-pay.  [facility] [address], [place] Phone: [phone]   [place], [place], [place] [phone], Ext. [phone] Mondays & Thursdays: 7-9 AM.  First 15 patients are seen on a first come, first serve basis.    [place] Providers:  Organization         Address  Phone   Notes  [facility] [address],  [phone] Also accepts self-pay patients.  [facility] [address], [place]  [phone]   [place], [address], [place] [phone]   [facility] [address] [place] [phone]   Lucianne Lei [address], [place]   [phone] Only accepts [place] [facility] [place] patients after they have their name applied to their card.   Self-Pay (no insurance) in [facility]:  Organization         Address  Phone   Notes  Sickle Cell Patients, [facility] [place] [phone]   [facility] [place] [phone]   [facility] [place]  [place], [address], [place] [phone]   [facility]/Dr. Osei-Bonsu  [address] [place] or [address], [place] [phone] Phone number for both [place] and [place] locations is the same.  Urgent Medical and [facility] [address] [place] [phone]   [facility] [address] [place] or [address] [phone]   [facility] [address], [place] [phone], phone; [phone], fax Sees patients 1st and 3rd Saturday of every month.  Must not qualify for public or private insurance (i.e. Medicaid, Medicare, [place] Health Choice, Veterans' Benefits)  Household income should be no more than 200% of the poverty level The clinic cannot treat you if you are pregnant or think you are pregnant  Sexually transmitted diseases are not treated at the clinic.

## 2018-12-03 NOTE — ED Triage Notes (Signed)
Pt has a open sore on scrotum right side and is in 10-10 pain

## 2019-01-15 ENCOUNTER — Encounter (HOSPITAL_COMMUNITY): Payer: Self-pay

## 2019-01-15 ENCOUNTER — Encounter (HOSPITAL_COMMUNITY)
Admission: EM | Disposition: A | Discharge status: Home / Self Care | Payer: Self-pay | Source: Home / Self Care | Attending: Emergency Medicine

## 2019-01-15 ENCOUNTER — Emergency Department (HOSPITAL_COMMUNITY): Payer: Medicare HMO | Admitting: Certified Registered Nurse Anesthetist

## 2019-01-15 ENCOUNTER — Emergency Department (HOSPITAL_COMMUNITY)
Admission: EM | Admit: 2019-01-15 | Discharge: 2019-01-15 | Disposition: A | Discharge status: Home / Self Care | Payer: Medicare HMO | Attending: Otolaryngology | Admitting: Otolaryngology

## 2019-01-15 ENCOUNTER — Other Ambulatory Visit: Payer: Self-pay

## 2019-01-15 ENCOUNTER — Ambulatory Visit (HOSPITAL_COMMUNITY): Admit: 2019-01-15 | Payer: Medicare HMO | Admitting: Gastroenterology

## 2019-01-15 DIAGNOSIS — G8254 Quadriplegia, C5-C7 incomplete: Secondary | ICD-10-CM | POA: Diagnosis not present

## 2019-01-15 DIAGNOSIS — T17228A Food in pharynx causing other injury, initial encounter: Secondary | ICD-10-CM | POA: Diagnosis not present

## 2019-01-15 DIAGNOSIS — G8929 Other chronic pain: Secondary | ICD-10-CM | POA: Diagnosis not present

## 2019-01-15 DIAGNOSIS — X58XXXA Exposure to other specified factors, initial encounter: Secondary | ICD-10-CM | POA: Diagnosis not present

## 2019-01-15 DIAGNOSIS — T18128A Food in esophagus causing other injury, initial encounter: Secondary | ICD-10-CM | POA: Diagnosis present

## 2019-01-15 DIAGNOSIS — K222 Esophageal obstruction: Secondary | ICD-10-CM

## 2019-01-15 DIAGNOSIS — Z1159 Encounter for screening for other viral diseases: Secondary | ICD-10-CM | POA: Insufficient documentation

## 2019-01-15 HISTORY — PX: RIGID ESOPHAGOSCOPY: SHX5226

## 2019-01-15 HISTORY — PX: ESOPHAGOGASTRODUODENOSCOPY (EGD) WITH PROPOFOL: SHX5813

## 2019-01-15 HISTORY — PX: DIRECT LARYNGOSCOPY: SHX5326

## 2019-01-15 LAB — BASIC METABOLIC PANEL
Anion gap: 7 (ref 5–15)
BUN: 14 mg/dL (ref 6–20)
CO2: 25 mmol/L (ref 22–32)
Calcium: 8.9 mg/dL (ref 8.9–10.3)
Chloride: 104 mmol/L (ref 98–111)
Creatinine, Ser: 1.05 mg/dL (ref 0.61–1.24)
GFR calc Af Amer: >60 mL/min (ref >60)
GFR calc non Af Amer: >60 mL/min (ref >60)
Glucose, Bld: 120 mg/dL — ABNORMAL HIGH (ref 70–99)
Potassium: 4 mmol/L (ref 3.5–5.1)
Sodium: 136 mmol/L (ref 135–145)

## 2019-01-15 LAB — CBC WITH DIFFERENTIAL/PLATELET
Abs Immature Granulocytes: 0.02 10*3/uL (ref 0.00–0.07)
Basophils Absolute: 0.1 10*3/uL (ref 0.0–0.1)
Basophils Relative: 1 %
Eosinophils Absolute: 0.7 10*3/uL — ABNORMAL HIGH (ref 0.0–0.5)
Eosinophils Relative: 9 %
HCT: 39.1 % (ref 39.0–52.0)
Hemoglobin: 12.6 g/dL — ABNORMAL LOW (ref 13.0–17.0)
Immature Granulocytes: 0 %
Lymphocytes Relative: 34 %
Lymphs Abs: 2.7 10*3/uL (ref 0.7–4.0)
MCH: 27 pg (ref 26.0–34.0)
MCHC: 32.2 g/dL (ref 30.0–36.0)
MCV: 83.9 fL (ref 80.0–100.0)
Monocytes Absolute: 1 10*3/uL (ref 0.1–1.0)
Monocytes Relative: 13 %
Neutro Abs: 3.6 10*3/uL (ref 1.7–7.7)
Neutrophils Relative %: 43 %
Platelets: 502 10*3/uL — ABNORMAL HIGH (ref 150–400)
RBC: 4.66 MIL/uL (ref 4.22–5.81)
RDW: 12.7 % (ref 11.5–15.5)
WBC: 8.1 10*3/uL (ref 4.0–10.5)
nRBC: 0 % (ref 0.0–0.2)

## 2019-01-15 LAB — SARS CORONAVIRUS 2 BY RT PCR (HOSPITAL ORDER, PERFORMED IN ~~LOC~~ HOSPITAL LAB): SARS Coronavirus 2: NEGATIVE

## 2019-01-15 SURGERY — ESOPHAGOGASTRODUODENOSCOPY (EGD) WITH PROPOFOL
Anesthesia: General

## 2019-01-15 SURGERY — LARYNGOSCOPY, DIRECT
Anesthesia: General

## 2019-01-15 SURGERY — EGD (ESOPHAGOGASTRODUODENOSCOPY)
Anesthesia: Monitor Anesthesia Care

## 2019-01-15 MED ORDER — PROPOFOL 10 MG/ML IV BOLUS
INTRAVENOUS | Status: DC | PRN
  Administered 2019-01-15: 150 mg via INTRAVENOUS

## 2019-01-15 MED ORDER — LIDOCAINE 2% (20 MG/ML) 5 ML SYRINGE
INTRAMUSCULAR | Status: DC | PRN
  Administered 2019-01-15: 80 mg via INTRAVENOUS

## 2019-01-15 MED ORDER — SODIUM CHLORIDE 0.9 % IV SOLN
INTRAVENOUS | Status: DC | PRN
  Administered 2019-01-15: 35 ug/min via INTRAVENOUS

## 2019-01-15 MED ORDER — DEXAMETHASONE SODIUM PHOSPHATE 10 MG/ML IJ SOLN
INTRAMUSCULAR | Status: DC | PRN
  Administered 2019-01-15: 10 mg via INTRAVENOUS

## 2019-01-15 MED ORDER — ROCURONIUM BROMIDE 50 MG/5ML IV SOSY
PREFILLED_SYRINGE | INTRAVENOUS | Status: DC | PRN
  Administered 2019-01-15: 20 mg via INTRAVENOUS
  Administered 2019-01-15: 40 mg via INTRAVENOUS
  Administered 2019-01-15: 20 mg via INTRAVENOUS

## 2019-01-15 MED ORDER — MIDAZOLAM HCL 2 MG/2ML IJ SOLN
INTRAMUSCULAR | Status: AC
  Filled 2019-01-15: qty 2

## 2019-01-15 MED ORDER — 0.9 % SODIUM CHLORIDE (POUR BTL) OPTIME
TOPICAL | Status: DC | PRN
  Administered 2019-01-15: 1000 mL

## 2019-01-15 MED ORDER — LACTATED RINGERS IV SOLN
INTRAVENOUS | Status: DC | PRN
  Administered 2019-01-15: 12:00:00 via INTRAVENOUS

## 2019-01-15 MED ORDER — SUGAMMADEX SODIUM 200 MG/2ML IV SOLN
INTRAVENOUS | Status: DC | PRN
  Administered 2019-01-15: 300 mg via INTRAVENOUS

## 2019-01-15 MED ORDER — GLUCAGON HCL RDNA (DIAGNOSTIC) 1 MG IJ SOLR
1.0000 mg | Freq: Once | INTRAMUSCULAR | Status: AC
  Administered 2019-01-15: 1 mg via INTRAVENOUS
  Filled 2019-01-15: qty 1

## 2019-01-15 MED ORDER — FENTANYL CITRATE (PF) 100 MCG/2ML IJ SOLN
INTRAMUSCULAR | Status: AC
  Filled 2019-01-15: qty 2

## 2019-01-15 MED ORDER — PHENYLEPHRINE 40 MCG/ML (10ML) SYRINGE FOR IV PUSH (FOR BLOOD PRESSURE SUPPORT)
PREFILLED_SYRINGE | INTRAVENOUS | Status: DC | PRN
  Administered 2019-01-15 (×5): 80 ug via INTRAVENOUS

## 2019-01-15 MED ORDER — PROPOFOL 10 MG/ML IV BOLUS
INTRAVENOUS | Status: AC
  Filled 2019-01-15: qty 40

## 2019-01-15 MED ORDER — PROPOFOL 10 MG/ML IV BOLUS
INTRAVENOUS | Status: AC
  Filled 2019-01-15: qty 20

## 2019-01-15 MED ORDER — FENTANYL CITRATE (PF) 100 MCG/2ML IJ SOLN
INTRAMUSCULAR | Status: DC | PRN
  Administered 2019-01-15: 25 ug via INTRAVENOUS

## 2019-01-15 MED ORDER — SODIUM CHLORIDE 0.9 % IV SOLN: INTRAVENOUS | Status: DC

## 2019-01-15 MED ORDER — ONDANSETRON HCL 4 MG/2ML IJ SOLN
INTRAMUSCULAR | Status: DC | PRN
  Administered 2019-01-15: 4 mg via INTRAVENOUS

## 2019-01-15 MED ORDER — LACTATED RINGERS IV SOLN
INTRAVENOUS | Status: DC
  Administered 2019-01-15: 11:00:00 via INTRAVENOUS

## 2019-01-15 MED ORDER — PROPOFOL 500 MG/50ML IV EMUL
INTRAVENOUS | Status: DC | PRN
  Administered 2019-01-15: 100 ug/kg/min via INTRAVENOUS

## 2019-01-15 MED ORDER — MIDAZOLAM HCL 5 MG/5ML IJ SOLN
INTRAMUSCULAR | Status: DC | PRN
  Administered 2019-01-15: 0.5 mg via INTRAVENOUS

## 2019-01-15 SURGICAL SUPPLY — 14 items

## 2019-01-15 SURGICAL SUPPLY — 20 items
CONT SPEC 4OZ CLIKSEAL STRL BL (MISCELLANEOUS) IMPLANT
COVER SURGICAL LIGHT HANDLE (MISCELLANEOUS) IMPLANT
COVER WAND RF STERILE (DRAPES) IMPLANT
DRAPE SHEET LG 3/4 BI-LAMINATE (DRAPES) IMPLANT
GAUZE SPONGE 4X4 12PLY STRL (GAUZE/BANDAGES/DRESSINGS) ×3 IMPLANT
GLOVE BIO SURGEON STRL SZ7.5 (GLOVE) ×3 IMPLANT
GLOVE ECLIPSE 8.0 STRL XLNG CF (GLOVE) ×3 IMPLANT
GOWN STRL REUS W/TWL XL LVL3 (GOWN DISPOSABLE) ×3 IMPLANT
KIT BASIN OR (CUSTOM PROCEDURE TRAY) IMPLANT
KIT TURNOVER KIT A (KITS) IMPLANT
MARKER SKIN DUAL TIP RULER LAB (MISCELLANEOUS) IMPLANT
NS IRRIG 1000ML POUR BTL (IV SOLUTION) ×3 IMPLANT
PACK BASIC VI WITH GOWN DISP (CUSTOM PROCEDURE TRAY) ×3 IMPLANT
PATTIES SURGICAL .5 X3 (DISPOSABLE) IMPLANT
PROTECTOR NERVE ULNAR (MISCELLANEOUS) IMPLANT
SURGILUBE 2OZ TUBE FLIPTOP (MISCELLANEOUS) IMPLANT
TOWEL OR 17X26 10 PK STRL BLUE (TOWEL DISPOSABLE) ×3 IMPLANT
TRAP SPECIMEN MUCOUS 40CC (MISCELLANEOUS) IMPLANT
TUBING CONNECTING 10 (TUBING) ×2 IMPLANT
TUBING CONNECTING 10' (TUBING) ×1

## 2019-01-15 NOTE — Op Note (Signed)
NAME: KAILAND, SEDA MEDICAL RECORD [id number] ACCOUNT [id number] DATE OF BIRTH:[birth year] FACILITY: [facility] LOCATION: [facility] PHYSICIAN: DRedmond Baseman, MD  OPERATIVE REPORT  DATE OF PROCEDURE:  01/15/2019  PREOPERATIVE DIAGNOSIS:  Esophageal food impaction.  POSTOPERATIVE DIAGNOSIS:  Esophageal food impaction.  PROCEDURE:   1,  Direct laryngoscopy with foreign body removal. 2.  Rigid esophagoscopy.  SURGEON:  Melida Quitter, MD  ANESTHESIA:  General endotracheal anesthesia.  COMPLICATIONS:  None.  INDICATIONS:  The patient is a 53 year old male who came to the Emergency Department last night due to pain and difficulty swallowing associated with swallowing a piece of the liver sandwich.  He was evaluated by GI who brought him to an upper endoscopy,  but was unable to clear the impaction.  Intraoperative consultation was requested for assistance and he was moved to the operating room for further management.  FINDINGS:  At about 15 cm from the teeth, in the proximal esophagus, there was a 4 piece of the liver that was able to be removed in 1 piece.  Further evaluation of the esophagus down to the distal esophagus was otherwise normal.  DESCRIPTION OF PROCEDURE:  The patient was brought from the endoscopy suite under general anesthesia reintubated and moved to the operating room and placed in the supine position.  consent had been obtained and so a timeout was taken.  The eyes were  already taped closed and the bed was turned 90 degrees from Anesthesia.  A damp gauze was placed over the upper teeth and a Jako laryngoscope was inserted to evaluate the various areas of the pharynx and larynx.  There was no evidence of foreign body in  the piriform sinuses or in the laryngeal structures.  The laryngoscope was then inserted into the postcricoid region and the larynx elevated, revealing the foreign body.  This was removed and then using a large cup forceps and came out in 1 piece.  After  this, the laryngoscope was removed and a rigid esophagoscope was passed through the mouth and down into the esophagus keeping the lumen in view down into the distal esophagus.  It was then slowly backed out evaluating the esophagus on the way out.   After this was completed, the bed was turned back to Anesthesia for wakeup and he was extubated in the recovery room in stable condition.  AN/NUANCE  D:01/15/2019 T:01/15/2019 JOB:[id number]

## 2019-01-15 NOTE — Anesthesia Postprocedure Evaluation (Signed)
Anesthesia Post Note  Patient: Mark Porter  Procedure(s) Performed: ESOPHAGOGASTRODUODENOSCOPY (EGD) WITH PROPOFOL (N/A ) DIRECT LARYNGOSCOPY WITH FOREIGH BODY REMOVAL (N/A ) RIGID ESOPHAGOSCOPY (N/A )     Patient location during evaluation: PACU Anesthesia Type: General Level of consciousness: awake and alert and awake Pain management: pain level controlled Vital Signs Assessment: post-procedure vital signs reviewed and stable Respiratory status: spontaneous breathing, nonlabored ventilation, respiratory function stable and patient connected to nasal cannula oxygen Cardiovascular status: blood pressure returned to baseline and stable Postop Assessment: no apparent nausea or vomiting Anesthetic complications: no    Last Vitals:  Vitals:   01/15/19 1233 01/15/19 1245  BP: 113/72 119/75  Pulse: 81 81  Resp: 20 (!) 21  Temp: 36.5 C   SpO2: 100% 100%    Last Pain:  Vitals:   01/15/19 1245  TempSrc:   PainSc: Asleep                 Catalina Gravel

## 2019-01-15 NOTE — ED Provider Notes (Signed)
Pt taken to endoscopy by GI   Jola Schmidt, MD 01/15/19 1606

## 2019-01-15 NOTE — ED Notes (Signed)
Bed: [facility]88 Expected date:  Expected time:  Means of arrival:  Comments: 53 yo M/obstruction in throat

## 2019-01-15 NOTE — Progress Notes (Signed)
Patient had large piece of liver removed from the proximal esophagus/pharyngeal area by Dr. Redmond Baseman.  Have discussed with girlfriend, sister, and 1 daughter.  He will go home and remain on clear liquids today and call for any problems.

## 2019-01-15 NOTE — Brief Op Note (Signed)
01/15/2019  12:28 PM  PATIENT:  Mark Porter  53 y.o. male  PRE-OPERATIVE DIAGNOSIS:  food impaction  POST-OPERATIVE DIAGNOSIS:  food impaction  PROCEDURE:  Procedure(s): DIRECT LARYNGOSCOPY WITH FOREIGH BODY REMOVAL (N/A) RIGID ESOPHAGOSCOPY (N/A)  SURGEON:  Surgeon(s) and Role:    Melida Quitter, MD - Primary  PHYSICIAN ASSISTANT:   ASSISTANTS: none   ANESTHESIA:   general  EBL: None  BLOOD ADMINISTERED:none  DRAINS: none   LOCAL MEDICATIONS USED:  NONE  SPECIMEN:  No Specimen  DISPOSITION OF SPECIMEN:  N/A  COUNTS:  YES  TOURNIQUET:  * No tourniquets in log *  DICTATION: .Other Dictation: Dictation Number [id number]  PLAN OF CARE: Discharge to home after PACU  PATIENT DISPOSITION:  PACU - hemodynamically stable.   Delay start of Pharmacological VTE agent (>24hrs) due to surgical blood loss or risk of bleeding: no
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
## 2019-01-15 NOTE — Anesthesia Preprocedure Evaluation (Addendum)
Anesthesia Evaluation  Patient identified by MRN, date of birth, ID band Patient awake    Reviewed: Allergy & Precautions, NPO status , Patient's Chart, lab work & pertinent test results  Airway Mallampati: II  TM Distance: >3 FB Neck ROM: Full    Dental  (+) Dental Advisory Given, Missing   Pulmonary neg pulmonary ROS,    Pulmonary exam normal breath sounds clear to auscultation       Cardiovascular negative cardio ROS Normal cardiovascular exam Rhythm:Regular Rate:Normal     Neuro/Psych C5-C7 incomplete quadriplegia     GI/Hepatic Neg liver ROS, Food Impaction   Endo/Other  negative endocrine ROS  Renal/GU negative Renal ROS     Musculoskeletal negative musculoskeletal ROS (+)   Abdominal   Peds  Hematology  (+) Blood dyscrasia, anemia ,   Anesthesia Other Findings Day of surgery medications reviewed with the patient.  Reproductive/Obstetrics                            Anesthesia Physical Anesthesia Plan  ASA: III  Anesthesia Plan: General   Post-op Pain Management:    Induction: Intravenous  PONV Risk Score and Plan: 2 and Dexamethasone and Ondansetron  Airway Management Planned: Oral ETT  Additional Equipment:   Intra-op Plan:   Post-operative Plan: Extubation in OR  Informed Consent: I have reviewed the patients History and Physical, chart, labs and discussed the procedure including the risks, benefits and alternatives for the proposed anesthesia with the patient or authorized representative who has indicated his/her understanding and acceptance.     Dental advisory given  Plan Discussed with: CRNA  Anesthesia Plan Comments:         Anesthesia Quick Evaluation
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
## 2019-01-15 NOTE — Anesthesia Procedure Notes (Addendum)
Procedure Name: Intubation Date/Time: 01/15/2019 11:01 AM Performed by: Maxwell Caul, CRNA Pre-anesthesia Checklist: Patient identified, Emergency Drugs available, Suction available and Patient being monitored Patient Re-evaluated:Patient Re-evaluated prior to induction Oxygen Delivery Method: Circle system utilized Preoxygenation: Pre-oxygenation with 100% oxygen Induction Type: IV induction and Rapid sequence Laryngoscope Size: Glidescope and 3 Grade View: Grade I Tube type: Oral Tube size: 7.5 mm Number of attempts: 1 Airway Equipment and Method: Stylet Placement Confirmation: ETT inserted through vocal cords under direct vision,  positive ETCO2 and breath sounds checked- equal and bilateral Secured at: 21 cm Tube secured with: Tape Dental Injury: Teeth and Oropharynx as per pre-operative assessment  Difficulty Due To: Difficulty was anticipated Comments: Elective Glidescope intubation LoPro 3 with Grade 1 view. ETT 7.5 passed with ease.

## 2019-01-15 NOTE — Transfer of Care (Signed)
Immediate Anesthesia Transfer of Care Note  Patient: Mark Porter  Procedure(s) Performed: ESOPHAGOGASTRODUODENOSCOPY (EGD) WITH PROPOFOL (N/A ) DIRECT LARYNGOSCOPY WITH FOREIGH BODY REMOVAL (N/A ) RIGID ESOPHAGOSCOPY (N/A )  Patient Location: PACU  Anesthesia Type:General  Level of Consciousness: drowsy  Airway & Oxygen Therapy: Patient Spontanous Breathing and Patient connected to face mask oxygen  Post-op Assessment: Report given to RN and Post -op Vital signs reviewed and stable  Post vital signs: Reviewed and stable  Last Vitals:  Vitals Value Taken Time  BP    Temp    Pulse    Resp    SpO2      Last Pain:  Vitals:   01/15/19 1023  TempSrc: Oral  PainSc: 6          Complications: No apparent anesthesia complications
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
## 2019-01-15 NOTE — Discharge Instr - Supplementary Instructions (Signed)

## 2019-01-15 NOTE — Discharge Instructions (Signed)
Clear liquids today call [phone] for any severe pain.
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
## 2019-01-15 NOTE — Interval H&P Note (Signed)
History and Physical Interval Note:  01/15/2019 10:45 AM  Mark Porter  has presented today for surgery, with the diagnosis of Food Impaction.  The various methods of treatment have been discussed with the patient and family. After consideration of risks, benefits and other options for treatment, the patient has consented to  Procedure(s): ESOPHAGOGASTRODUODENOSCOPY (EGD) WITH PROPOFOL (N/A) as a surgical intervention.  The patient's history has been reviewed, patient examined, no change in status, stable for surgery.  I have reviewed the patient's chart and labs.  Questions were answered to the patient's satisfaction.     Nancy Fetter

## 2019-01-15 NOTE — Progress Notes (Signed)
Patient has complete obstruction of the esophagus.  Proximally probable invading the oropharynx and piriform sinus.  He is intubated.  We are unable to pass the adult scope or the pediatric scope due to the large food bolus obstructing.  I called both daughters and the numbers left in the chart was no answer with either 1 so I left a voicemail to call me.  ENT has been called.

## 2019-01-15 NOTE — ED Triage Notes (Addendum)
Patient attempted to eat a calf liver sandwich around 0245 and states that he feels like something is stuck in his throat. Denies any difficulty breathing. Patient SpO2 is 100% on RA.

## 2019-01-16 ENCOUNTER — Encounter (HOSPITAL_COMMUNITY): Payer: Self-pay | Admitting: Otolaryngology

## 2019-01-16 NOTE — Op Note (Signed)
[facility] Patient Name: Mark MeuseJimmy Porter Procedure Date: 01/15/2019 MRN: [id number] Attending MD: Tresea MallJames L  Dr., MD Date of Birth: 17-Jan-1966 CSN: [id number] Age: [age] Admit Type: Outpatient Procedure:                Upper GI endoscopy Indications:              Foreign body in the esophagus, patient was eating a                            liver sandwich around midnight been unable to                            swallow since then having severe throat pain. No                            history of prior esophageal strictures, reflux etc. Providers:                Llana AlimentJames L.  Dr., MD, Dwain SarnaPatricia Ford, RN,                            Kandice RobinsonsGuillaume Awaka, Technician Referring MD:              Medicines:                General Anesthesia Complications:            No immediate complications. Estimated Blood Loss:     Estimated blood loss: none. Procedure:                Pre-Anesthesia Assessment:                           - Prior to the procedure, a History and Physical                            was performed, and patient medications and                            allergies were reviewed. The patient's tolerance of                            previous anesthesia was also reviewed. The risks                            and benefits of the procedure and the sedation                            options and risks were discussed with the patient.                            All questions were answered, and informed consent                            was obtained. Prior Anticoagulants: The patient has  taken no previous anticoagulant or antiplatelet                            agents. ASA Grade Assessment: III - A patient with                            severe systemic disease. After reviewing the risks                            and benefits, the patient was deemed in                            satisfactory condition to undergo the procedure.             After obtaining informed consent, the endoscope was                            passed under direct vision. Throughout the                            procedure, the patient's blood pressure, pulse, and                            oxygen saturations were monitored continuously. The                            [id number]-[id number] ([id number] Olympus gastroscope was                            introduced through the mouth, and advanced to the                            upper third of esophagus. The [id number]-[id number] ([id number]                            Olympus ultra slim endoscope was introduced through                            the mouth, and advanced to the. The upper GI                            endoscopy was extremely difficult. Scope In: Scope Out: Findings:      Food was found at the cricopharyngeus. We were unable to use the adult       scope and had to pass the pediatric scope. The adult scope would not go       at all even with the balloon the endotracheal tube deflated. There was a       large food bolus that seemed to be in the piriform sinus right at 15 cm       from the teeth. The seem to extend down into the esophagus and       essentially filled the lumen of the esophagus. I was unable to advance       the pediatric scope and this was withdrawn. Impression:               -  Food at the cricopharyngeus.                           - No specimens collected. Moderate Sedation:      See anesthesia note, no moderate sedation. Recommendation:           - The patient will be taken to the OR ENT has been                            called to assist with removing this food bolus and                            obstructed esophagus. Procedure Code(s):        --- Professional ---                           72536, Esophagoscopy, flexible, transoral;                            diagnostic, including collection of specimen(s) by                            brushing or washing, when performed (separate                             procedure) Diagnosis Code(s):        --- Professional ---                           U44.034V, Food in esophagus causing other injury,                            initial encounter                           T18.108A, Unspecified foreign body in esophagus                            causing other injury, initial encounter CPT copyright 2019 American Medical Association. All rights reserved. The codes documented in this report are preliminary and upon coder review may  be revised to meet current compliance requirements. Nancy Fetter Dr., MD 01/15/2019 11:47:54 AM This report has been signed electronically. Number of Addenda: 0

## 2020-05-24 ENCOUNTER — Emergency Department (HOSPITAL_COMMUNITY): Payer: Medicare HMO

## 2020-05-24 ENCOUNTER — Inpatient Hospital Stay (HOSPITAL_COMMUNITY)
Admission: EM | Admit: 2020-05-24 | Discharge: 2020-06-05 | DRG: 917 | Disposition: A | Discharge status: Home Health | Payer: Medicare HMO | Attending: Internal Medicine | Admitting: Internal Medicine

## 2020-05-24 ENCOUNTER — Encounter (HOSPITAL_COMMUNITY): Payer: Self-pay | Admitting: Pulmonary Disease

## 2020-05-24 ENCOUNTER — Inpatient Hospital Stay (HOSPITAL_COMMUNITY): Payer: Medicare HMO

## 2020-05-24 DIAGNOSIS — D72829 Elevated white blood cell count, unspecified: Secondary | ICD-10-CM

## 2020-05-24 DIAGNOSIS — R069 Unspecified abnormalities of breathing: Secondary | ICD-10-CM

## 2020-05-24 DIAGNOSIS — F1920 Other psychoactive substance dependence, uncomplicated: Secondary | ICD-10-CM | POA: Diagnosis not present

## 2020-05-24 DIAGNOSIS — F1120 Opioid dependence, uncomplicated: Secondary | ICD-10-CM

## 2020-05-24 DIAGNOSIS — R451 Restlessness and agitation: Secondary | ICD-10-CM

## 2020-05-24 DIAGNOSIS — L989 Disorder of the skin and subcutaneous tissue, unspecified: Secondary | ICD-10-CM

## 2020-05-24 DIAGNOSIS — R4182 Altered mental status, unspecified: Secondary | ICD-10-CM

## 2020-05-24 DIAGNOSIS — G9340 Encephalopathy, unspecified: Secondary | ICD-10-CM | POA: Diagnosis not present

## 2020-05-24 DIAGNOSIS — G894 Chronic pain syndrome: Secondary | ICD-10-CM | POA: Diagnosis present

## 2020-05-24 DIAGNOSIS — D75839 Thrombocytosis, unspecified: Secondary | ICD-10-CM | POA: Diagnosis present

## 2020-05-24 DIAGNOSIS — T24391S Burn of third degree of multiple sites of right lower limb, except ankle and foot, sequela: Secondary | ICD-10-CM | POA: Diagnosis not present

## 2020-05-24 DIAGNOSIS — S14105S Unspecified injury at C5 level of cervical spinal cord, sequela: Secondary | ICD-10-CM

## 2020-05-24 DIAGNOSIS — R748 Abnormal levels of other serum enzymes: Secondary | ICD-10-CM | POA: Diagnosis present

## 2020-05-24 DIAGNOSIS — F1410 Cocaine abuse, uncomplicated: Secondary | ICD-10-CM | POA: Diagnosis present

## 2020-05-24 DIAGNOSIS — I959 Hypotension, unspecified: Secondary | ICD-10-CM | POA: Diagnosis present

## 2020-05-24 DIAGNOSIS — Z91410 Personal history of adult physical and sexual abuse: Secondary | ICD-10-CM | POA: Diagnosis not present

## 2020-05-24 DIAGNOSIS — D649 Anemia, unspecified: Secondary | ICD-10-CM | POA: Diagnosis present

## 2020-05-24 DIAGNOSIS — Z20822 Contact with and (suspected) exposure to covid-19: Secondary | ICD-10-CM | POA: Diagnosis present

## 2020-05-24 DIAGNOSIS — T405X1A Poisoning by cocaine, accidental (unintentional), initial encounter: Secondary | ICD-10-CM | POA: Diagnosis present

## 2020-05-24 DIAGNOSIS — Z993 Dependence on wheelchair: Secondary | ICD-10-CM | POA: Diagnosis not present

## 2020-05-24 DIAGNOSIS — G8254 Quadriplegia, C5-C7 incomplete: Secondary | ICD-10-CM | POA: Diagnosis present

## 2020-05-24 DIAGNOSIS — Y92009 Unspecified place in unspecified non-institutional (private) residence as the place of occurrence of the external cause: Secondary | ICD-10-CM | POA: Diagnosis not present

## 2020-05-24 DIAGNOSIS — S01112A Laceration without foreign body of left eyelid and periocular area, initial encounter: Secondary | ICD-10-CM | POA: Diagnosis present

## 2020-05-24 DIAGNOSIS — T24392S Burn of third degree of multiple sites of left lower limb, except ankle and foot, sequela: Secondary | ICD-10-CM

## 2020-05-24 DIAGNOSIS — W050XXA Fall from non-moving wheelchair, initial encounter: Secondary | ICD-10-CM | POA: Diagnosis present

## 2020-05-24 DIAGNOSIS — Z79899 Other long term (current) drug therapy: Secondary | ICD-10-CM | POA: Diagnosis not present

## 2020-05-24 DIAGNOSIS — E876 Hypokalemia: Secondary | ICD-10-CM | POA: Diagnosis present

## 2020-05-24 DIAGNOSIS — T40711A Poisoning by cannabis, accidental (unintentional), initial encounter: Secondary | ICD-10-CM | POA: Diagnosis present

## 2020-05-24 DIAGNOSIS — G928 Other toxic encephalopathy: Secondary | ICD-10-CM | POA: Diagnosis present

## 2020-05-24 DIAGNOSIS — R0600 Dyspnea, unspecified: Secondary | ICD-10-CM | POA: Diagnosis not present

## 2020-05-24 DIAGNOSIS — Z781 Physical restraint status: Secondary | ICD-10-CM | POA: Diagnosis not present

## 2020-05-24 DIAGNOSIS — T424X1A Poisoning by benzodiazepines, accidental (unintentional), initial encounter: Secondary | ICD-10-CM | POA: Diagnosis present

## 2020-05-24 DIAGNOSIS — R410 Disorientation, unspecified: Secondary | ICD-10-CM | POA: Diagnosis not present

## 2020-05-24 HISTORY — DX: Long term (current) use of opiate analgesic: Z79.891

## 2020-05-24 HISTORY — DX: Other long term (current) drug therapy: Z79.899

## 2020-05-24 HISTORY — DX: Unspecified injury at unspecified level of cervical spinal cord, initial encounter: S14.109A

## 2020-05-24 LAB — CBC WITH DIFFERENTIAL/PLATELET
Abs Immature Granulocytes: 0.04 10*3/uL (ref 0.00–0.07)
Basophils Absolute: 0.1 10*3/uL (ref 0.0–0.1)
Basophils Relative: 1 %
Eosinophils Absolute: 0.6 10*3/uL — ABNORMAL HIGH (ref 0.0–0.5)
Eosinophils Relative: 5 %
HCT: 41.4 % (ref 39.0–52.0)
Hemoglobin: 12.8 g/dL — ABNORMAL LOW (ref 13.0–17.0)
Immature Granulocytes: 0 %
Lymphocytes Relative: 30 %
Lymphs Abs: 3.6 10*3/uL (ref 0.7–4.0)
MCH: 26.1 pg (ref 26.0–34.0)
MCHC: 30.9 g/dL (ref 30.0–36.0)
MCV: 84.5 fL (ref 80.0–100.0)
Monocytes Absolute: 1.5 10*3/uL — ABNORMAL HIGH (ref 0.1–1.0)
Monocytes Relative: 12 %
Neutro Abs: 6.3 10*3/uL (ref 1.7–7.7)
Neutrophils Relative %: 52 %
Platelets: 664 10*3/uL — ABNORMAL HIGH (ref 150–400)
RBC: 4.9 MIL/uL (ref 4.22–5.81)
RDW: 13.6 % (ref 11.5–15.5)
WBC: 12.2 10*3/uL — ABNORMAL HIGH (ref 4.0–10.5)
nRBC: 0 % (ref 0.0–0.2)

## 2020-05-24 LAB — URINALYSIS, ROUTINE W REFLEX MICROSCOPIC
Bilirubin Urine: NEGATIVE
Glucose, UA: NEGATIVE mg/dL
Hgb urine dipstick: NEGATIVE
Ketones, ur: NEGATIVE mg/dL
Leukocytes,Ua: NEGATIVE
Nitrite: NEGATIVE
Protein, ur: NEGATIVE mg/dL
Specific Gravity, Urine: 1.017 (ref 1.005–1.030)
pH: 5 (ref 5.0–8.0)

## 2020-05-24 LAB — RAPID URINE DRUG SCREEN, HOSP PERFORMED
Amphetamines: NOT DETECTED
Barbiturates: NOT DETECTED
Benzodiazepines: POSITIVE — AB
Cocaine: POSITIVE — AB
Opiates: NOT DETECTED
Tetrahydrocannabinol: POSITIVE — AB

## 2020-05-24 LAB — COMPREHENSIVE METABOLIC PANEL
ALT: 103 U/L — ABNORMAL HIGH (ref 0–44)
AST: 101 U/L — ABNORMAL HIGH (ref 15–41)
Albumin: 3 g/dL — ABNORMAL LOW (ref 3.5–5.0)
Alkaline Phosphatase: 88 U/L (ref 38–126)
Anion gap: 7 (ref 5–15)
BUN: 17 mg/dL (ref 6–20)
CO2: 21 mmol/L — ABNORMAL LOW (ref 22–32)
Calcium: 9 mg/dL (ref 8.9–10.3)
Chloride: 110 mmol/L (ref 98–111)
Creatinine, Ser: 0.84 mg/dL (ref 0.61–1.24)
GFR, Estimated: >60 mL/min (ref >60)
Glucose, Bld: 86 mg/dL (ref 70–99)
Potassium: 4.4 mmol/L (ref 3.5–5.1)
Sodium: 138 mmol/L (ref 135–145)
Total Bilirubin: 0.5 mg/dL (ref 0.3–1.2)
Total Protein: 8.5 g/dL — ABNORMAL HIGH (ref 6.5–8.1)

## 2020-05-24 LAB — I-STAT CHEM 8, ED
BUN: 19 mg/dL (ref 6–20)
Calcium, Ion: 1.08 mmol/L — ABNORMAL LOW (ref 1.15–1.40)
Chloride: 109 mmol/L (ref 98–111)
Creatinine, Ser: 0.7 mg/dL (ref 0.61–1.24)
Glucose, Bld: 81 mg/dL (ref 70–99)
HCT: 41 % (ref 39.0–52.0)
Hemoglobin: 13.9 g/dL (ref 13.0–17.0)
Potassium: 4.3 mmol/L (ref 3.5–5.1)
Sodium: 141 mmol/L (ref 135–145)
TCO2: 19 mmol/L — ABNORMAL LOW (ref 22–32)

## 2020-05-24 LAB — GLUCOSE, CAPILLARY
Glucose-Capillary: 118 mg/dL — ABNORMAL HIGH (ref 70–99)
Glucose-Capillary: 66 mg/dL — ABNORMAL LOW (ref 70–99)
Glucose-Capillary: 85 mg/dL (ref 70–99)
Glucose-Capillary: 86 mg/dL (ref 70–99)

## 2020-05-24 LAB — CK: Total CK: 221 U/L (ref 49–397)

## 2020-05-24 LAB — I-STAT VENOUS BLOOD GAS, ED
Acid-base deficit: 2 mmol/L (ref 0.0–2.0)
Bicarbonate: 21.1 mmol/L (ref 20.0–28.0)
Calcium, Ion: 1.11 mmol/L — ABNORMAL LOW (ref 1.15–1.40)
HCT: 40 % (ref 39.0–52.0)
Hemoglobin: 13.6 g/dL (ref 13.0–17.0)
O2 Saturation: 81 %
Potassium: 4.4 mmol/L (ref 3.5–5.1)
Sodium: 141 mmol/L (ref 135–145)
TCO2: 22 mmol/L (ref 22–32)
pCO2, Ven: 31 mmHg — ABNORMAL LOW (ref 44.0–60.0)
pH, Ven: 7.44 — ABNORMAL HIGH (ref 7.250–7.430)
pO2, Ven: 43 mmHg (ref 32.0–45.0)

## 2020-05-24 LAB — CBG MONITORING, ED: Glucose-Capillary: 100 mg/dL — ABNORMAL HIGH (ref 70–99)

## 2020-05-24 LAB — ACETAMINOPHEN LEVEL: Acetaminophen (Tylenol), Serum: <10 ug/mL — ABNORMAL LOW (ref 10–30)

## 2020-05-24 LAB — AMMONIA: Ammonia: 48 umol/L — ABNORMAL HIGH (ref 9–35)

## 2020-05-24 LAB — ETHANOL: Alcohol, Ethyl (B): <10 mg/dL (ref <10)

## 2020-05-24 LAB — RESPIRATORY PANEL BY RT PCR (FLU A&B, COVID)
Influenza A by PCR: NEGATIVE
Influenza B by PCR: NEGATIVE
SARS Coronavirus 2 by RT PCR: NEGATIVE

## 2020-05-24 LAB — MRSA PCR SCREENING: MRSA by PCR: POSITIVE — AB

## 2020-05-24 LAB — SALICYLATE LEVEL: Salicylate Lvl: <7 mg/dL — ABNORMAL LOW (ref 7.0–30.0)

## 2020-05-24 MED ORDER — SODIUM CHLORIDE 0.9 % IV BOLUS
1000.0000 mL | Freq: Once | INTRAVENOUS | Status: AC
  Administered 2020-05-24: 1000 mL via INTRAVENOUS

## 2020-05-24 MED ORDER — INSULIN ASPART 100 UNIT/ML ~~LOC~~ SOLN
0.0000 [IU] | SUBCUTANEOUS | Status: DC
  Administered 2020-05-27 – 2020-05-30 (×3): 2 [IU] via SUBCUTANEOUS
  Administered 2020-05-30: 3 [IU] via SUBCUTANEOUS
  Administered 2020-05-30 – 2020-06-04 (×6): 2 [IU] via SUBCUTANEOUS
  Administered 2020-06-05: 3 [IU] via SUBCUTANEOUS

## 2020-05-24 MED ORDER — PANTOPRAZOLE SODIUM 40 MG IV SOLR
40.0000 mg | Freq: Every day | INTRAVENOUS | Status: DC
  Administered 2020-05-24: 40 mg via INTRAVENOUS
  Filled 2020-05-24: qty 40

## 2020-05-24 MED ORDER — LORAZEPAM 2 MG/ML IJ SOLN
1.0000 mg | INTRAMUSCULAR | Status: DC | PRN
  Administered 2020-05-25 (×4): 1 mg via INTRAVENOUS
  Filled 2020-05-24 (×4): qty 1

## 2020-05-24 MED ORDER — SODIUM CHLORIDE 0.9 % IV SOLN
2.0000 g | Freq: Once | INTRAVENOUS | Status: AC
  Administered 2020-05-24: 2 g via INTRAVENOUS
  Filled 2020-05-24: qty 20

## 2020-05-24 MED ORDER — DEXTROSE 50 % IV SOLN
12.5000 g | INTRAVENOUS | Status: AC
  Administered 2020-05-24: 12.5 g via INTRAVENOUS

## 2020-05-24 MED ORDER — CHLORHEXIDINE GLUCONATE CLOTH 2 % EX PADS
6.0000 | MEDICATED_PAD | Freq: Every day | CUTANEOUS | Status: DC
  Administered 2020-05-24 – 2020-06-05 (×11): 6 via TOPICAL

## 2020-05-24 MED ORDER — DOCUSATE SODIUM 100 MG PO CAPS
100.0000 mg | ORAL_CAPSULE | Freq: Two times a day (BID) | ORAL | Status: DC | PRN
  Administered 2020-05-28: 100 mg via ORAL
  Filled 2020-05-24: qty 1

## 2020-05-24 MED ORDER — SODIUM CHLORIDE 0.9 % IV SOLN
2.0000 g | INTRAVENOUS | Status: AC
  Administered 2020-05-24 – 2020-05-28 (×5): 2 g via INTRAVENOUS
  Filled 2020-05-24 (×4): qty 20
  Filled 2020-05-24: qty 2
  Filled 2020-05-24: qty 20

## 2020-05-24 MED ORDER — VANCOMYCIN HCL 750 MG/150ML IV SOLN
750.0000 mg | Freq: Three times a day (TID) | INTRAVENOUS | Status: DC
  Administered 2020-05-25: 750 mg via INTRAVENOUS
  Filled 2020-05-24 (×2): qty 150

## 2020-05-24 MED ORDER — LACTULOSE 10 GM/15ML PO SOLN
20.0000 g | Freq: Three times a day (TID) | ORAL | Status: DC
  Filled 2020-05-24: qty 30

## 2020-05-24 MED ORDER — LORAZEPAM 2 MG/ML IJ SOLN
2.0000 mg | Freq: Once | INTRAMUSCULAR | Status: AC
  Administered 2020-05-24: 2 mg via INTRAVENOUS
  Filled 2020-05-24: qty 1

## 2020-05-24 MED ORDER — LORAZEPAM 2 MG/ML IJ SOLN
INTRAMUSCULAR | Status: AC
  Administered 2020-05-24: 2 mg
  Filled 2020-05-24: qty 1

## 2020-05-24 MED ORDER — HALOPERIDOL LACTATE 5 MG/ML IJ SOLN
INTRAMUSCULAR | Status: AC | PRN
  Administered 2020-05-24: 5 mg via INTRAVENOUS

## 2020-05-24 MED ORDER — ONDANSETRON HCL 4 MG/2ML IJ SOLN: 4.0000 mg | Freq: Four times a day (QID) | INTRAMUSCULAR | Status: DC | PRN

## 2020-05-24 MED ORDER — POLYETHYLENE GLYCOL 3350 17 G PO PACK: 17.0000 g | PACK | Freq: Every day | ORAL | Status: DC | PRN

## 2020-05-24 MED ORDER — DEXTROSE 50 % IV SOLN
INTRAVENOUS | Status: AC
  Filled 2020-05-24: qty 50

## 2020-05-24 MED ORDER — DIAZEPAM 5 MG/ML IJ SOLN
5.0000 mg | Freq: Once | INTRAMUSCULAR | Status: AC
  Administered 2020-05-24: 5 mg via INTRAVENOUS
  Filled 2020-05-24: qty 2

## 2020-05-24 MED ORDER — MUPIROCIN 2 % EX OINT
1.0000 "application " | TOPICAL_OINTMENT | Freq: Two times a day (BID) | CUTANEOUS | Status: AC
  Administered 2020-05-24 – 2020-05-29 (×9): 1 via NASAL
  Filled 2020-05-24: qty 22

## 2020-05-24 MED ORDER — ENOXAPARIN SODIUM 30 MG/0.3ML ~~LOC~~ SOLN
30.0000 mg | SUBCUTANEOUS | Status: DC
  Administered 2020-05-24: 30 mg via SUBCUTANEOUS
  Filled 2020-05-24: qty 0.3

## 2020-05-24 MED ORDER — VANCOMYCIN HCL 1250 MG/250ML IV SOLN
1250.0000 mg | Freq: Once | INTRAVENOUS | Status: AC
  Administered 2020-05-24: 1250 mg via INTRAVENOUS
  Filled 2020-05-24: qty 250

## 2020-05-24 NOTE — ED Notes (Signed)
Patient from home, reported unwitnessed fall from wheelchair. Roommate put patient in bed and came back later to minimal responsiveness and blood on bed. Lac to left eyebrow controlled bleeding. On EMS arrival patient was minimally responsive, received narcan with immediate uncontrolled muscle movement. 2.5mg  versed given. IV to left AC. Spastic muscle movements on arrival to ED, airway intact. Patient given 5mg  haldol, 2mg  ativan.

## 2020-05-24 NOTE — Progress Notes (Addendum)
Hypoglycemic Event  CBG: 66  Treatment: D50 12.5  Symptoms: None  Follow-up CBG: 0000  CBG Result: 118  Possible Reasons for Event: NPO  Comments/MD notified: Will notify MD if sugar does not go up.     Mark Porter

## 2020-05-24 NOTE — ED Notes (Signed)
Neuro at bedside.

## 2020-05-24 NOTE — Progress Notes (Signed)
Shirt, sweat pants, and cell phone brought up by security from ED at this time. Belongings placed at patient's bedside.

## 2020-05-24 NOTE — ED Notes (Addendum)
MD at bedside as pt continues to thrash around bed, pt has incomprehensible speech at times, pupils are 3 and equal, will respond to pain only. Maintain airway at this time 02sats 96 on room air.  Verbal order for restraints due to swinging both arms and legs hitting staff. Neuro has been consulted.
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
## 2020-05-24 NOTE — Progress Notes (Signed)
Pharmacy Antibiotic Note  Mark Porter is a 54 y.o. male admitted on 05/24/2020 with pneumonia on rocephin.  Pharmacy has been consulted for vancomycin dosing.  -WBC= 12.1, afebrile, CXR with possible pneumonia -CrCl ~ 90 -MRSA PCR- positive  Plan: -Vancomycin 1250mg  IV x1 followed by 750mg  IV q8h -Will follow renal function, cultures and clinical progress    Height: 5\' 7"  (170.2 cm) Weight: 63.5 kg (140 lb) IBW/kg (Calculated) : 66.1  Temp (24hrs), Avg:98.6 F (37 C), Min:98 F (36.7 C), Max:99.7 F (37.6 C)  Recent Labs  Lab 05/24/20 1245 05/24/20 1251  WBC 12.2*  --   CREATININE 0.84 0.70    Estimated Creatinine Clearance: 94.8 mL/min (by C-G formula based on SCr of 0.7 mg/dL).    Not on File   Thank you for allowing pharmacy to be a part of this patient's care.  , PharmD Clinical Pharmacist **Pharmacist phone directory can now be found on [email or web address] ([email or web address]).  Listed under [facility].

## 2020-05-24 NOTE — ED Provider Notes (Signed)
[facility] Provider Note   CSN: [id number] Arrival date & time: 05/24/20  1227     History Chief Complaint  Patient presents with  . Altered Mental Status    Mark Porter is a 54 y.o. male.  Level 5 caveat due to altered mental status.  Patient per EMS according to his remains had a fall out of his wheelchair this morning.  Has a history of paraplegia and substance abuse.  Known to abuse heroin and cocaine.  Roommate state that he fell out of the wheelchair and looks to have cut his left eyebrow.  They put him back in bed and went to check on him later and he was confused.  With EMS he was talking but appeared somnolent had pinpoint pupils and slowly became more apneic throughout EMS ride.  He was given Narcan and immediately after Narcan he started to have some spastic shaking.  Vital signs were normal and blood sugar was normal.  Appeared to be breathing better but less responsive with spastic twitching.  No seizure history.  Not on blood thinners.  The history is provided by the EMS personnel.  Altered Mental Status      No past medical history on file.  There are no problems to display for this patient.  PMH: paraplegia, substance abuse    No family history on file.  Social History   Tobacco Use  . Smoking status: Not on file  Substance Use Topics  . Alcohol use: Not on file  . Drug use: Not on file    Home Medications Prior to Admission medications   Medication Sig Start Date End Date Taking? Authorizing Provider  baclofen (LIORESAL) 10 MG tablet Take 10 mg by mouth 3 (three) times daily. 05/13/20   [provider]  gabapentin (NEURONTIN) 300 MG capsule Take 300 mg by mouth daily. 05/13/20   [provider]  meloxicam (MOBIC) 15 MG tablet Take 15 mg by mouth daily. 05/13/20   [provider]  NARCAN 4 MG/0.1ML LIQD nasal spray kit Place 4 mg into the nose once as needed. 04/14/20   [provider]  oxyCODONE (ROXICODONE) 15 MG immediate release tablet Take 15 mg by mouth 4 (four) times daily as needed. 03/14/20   [provider]  oxyCODONE-acetaminophen (PERCOCET) 10-325 MG tablet Take 1 tablet by mouth 4 (four) times daily as needed. 05/13/20   [provider]    Allergies    Patient has no allergy information on record.  Review of Systems   Review of Systems  Unable to perform ROS: Mental status change    Physical Exam Updated Vital Signs BP 132/75   Pulse (!) 116   Temp 99.7 F (37.6 C) (Rectal)   Resp (!) 21   Ht _0  (1.702 m)   Wt 63.5 kg   SpO2 93%   BMI 21.93 kg/m   Physical Exam Vitals and nursing note reviewed.  Constitutional:      General: He is in acute distress.     Appearance: He is well-developed. He is ill-appearing.  HENT:     Head:     Comments: Laceration to the left eyebrow that is hemostatic    Mouth/Throat:     Mouth: Mucous membranes are moist.  Eyes:     Extraocular Movements: Extraocular movements intact.     Conjunctiva/sclera: Conjunctivae normal.     Pupils: Pupils are equal, round, and reactive to light.  Cardiovascular:  Rate and Rhythm: Normal rate and regular rhythm.     Pulses: Normal pulses.     Heart sounds: No murmur heard.   Pulmonary:     Effort: Pulmonary effort is normal. No respiratory distress.     Breath sounds: Normal breath sounds.  Abdominal:     Palpations: Abdomen is soft.     Tenderness: There is no abdominal tenderness.  Musculoskeletal:        General: No deformity.     Cervical back: Neck supple.  Skin:    General: Skin is warm and dry.     Findings: Lesion (multiple areas of wounds on bilateral lower extremities) present.  Neurological:     General: No focal deficit present.     Mental Status: He is alert.     Comments: Appears to be moving all extremities, having spastic twitching intermittently throughout, no eye deviation, mumbles words     ED Results /  Procedures / Treatments   Labs (all labs ordered are listed, but only abnormal results are displayed) Labs Reviewed  CBC WITH DIFFERENTIAL/PLATELET - Abnormal; Notable for the following components:      Result Value   WBC 12.2 (*)    Hemoglobin 12.8 (*)    Platelets 664 (*)    Monocytes Absolute 1.5 (*)    Eosinophils Absolute 0.6 (*)    All other components within normal limits  COMPREHENSIVE METABOLIC PANEL - Abnormal; Notable for the following components:   CO2 21 (*)    Total Protein 8.5 (*)    Albumin 3.0 (*)    AST 101 (*)    ALT 103 (*)    All other components within normal limits  AMMONIA - Abnormal; Notable for the following components:   Ammonia 48 (*)    All other components within normal limits  ACETAMINOPHEN LEVEL - Abnormal; Notable for the following components:   Acetaminophen (Tylenol), Serum <10 (*)    All other components within normal limits  SALICYLATE LEVEL - Abnormal; Notable for the following components:   Salicylate Lvl <0.0 (*)    All other components within normal limits  CBG MONITORING, ED - Abnormal; Notable for the following components:   Glucose-Capillary 100 (*)    All other components within normal limits  I-STAT VENOUS BLOOD GAS, ED - Abnormal; Notable for the following components:   pH, Ven 7.440 (*)    pCO2, Ven 31.0 (*)    Calcium, Ion 1.11 (*)    All other components within normal limits  I-STAT CHEM 8, ED - Abnormal; Notable for the following components:   Calcium, Ion 1.08 (*)    TCO2 19 (*)    All other components within normal limits  RESPIRATORY PANEL BY RT PCR (FLU A&B, COVID)  ETHANOL  CK  RAPID URINE DRUG SCREEN, HOSP PERFORMED  URINALYSIS, ROUTINE W REFLEX MICROSCOPIC    EKG None  Radiology CT Head Wo Contrast  Result Date: 05/24/2020 CLINICAL DATA:  Pain following fall.  Altered mental status EXAM: CT HEAD WITHOUT CONTRAST CT CERVICAL SPINE WITHOUT CONTRAST TECHNIQUE: Multidetector CT imaging of the head and cervical  spine was performed following the standard protocol without intravenous contrast. Multiplanar CT image reconstructions of the cervical spine were also generated. COMPARISON:  None. FINDINGS: CT HEAD FINDINGS Motion artifact makes this study less than optimal. Brain: Ventricles and sulci are normal in size and configuration. There is no intracranial mass, hemorrhage extra-axial fluid collection, or midline shift. Brain parenchyma appears unremarkable with note of  motion artifact. No acute infarct demonstrable. Vascular: No hyperdense vessel. No arterial vascular calcification is demonstrable. Skull: Bony calvarium appears intact with assessment somewhat limited due to motion. Sinuses/Orbits: Opacification noted in several ethmoid air cells and in the left sphenoid sinus. Visualized orbits appear within normal limits with limitation from motion. Other: Mastoid air cells are clear. CT CERVICAL SPINE FINDINGS Significant limitations noted due to patient motion. Alignment: No demonstrable spondylolisthesis. There is upper thoracic levoscoliosis. Skull base and vertebrae: The skull base and craniocervical junction regions appear unremarkable. There is no appreciable fracture. Note that significant patient motion limits bony assessment with respect to potential nondisplaced fractures. No blastic or lytic lesions evident with limitation secondary to motion noted. Soft tissues and spinal canal: Prevertebral soft tissues and predental space regions appear within normal limits. No cord or canal hematoma is appreciable. No paraspinous lesions are evident. Disc levels: No obvious disc space narrowing noted. Motion artifact limits assessment of disc spaces. No disc extrusion or stenosis is appreciable. No significant exit foraminal narrowing is appreciable with assessment of exit foraminal limited due to patient motion. Upper chest: Visualized upper lung regions appear grossly normal. Other: None IMPRESSION: CT head: Study less  than optimal due to a degree of patient motion. No brain parenchymal lesions are appreciable. No evident acute infarct. No mass or hemorrhage evident. There are foci of paranasal sinus disease. CT cervical spine: Significant motion artifact limits assessment. No fracture or spondylolisthesis evident with limitations due to patient motion. No disc extrusion or stenosis evident. No overt arthropathy. Comment: If there are clinical symptoms suggesting neurologic deficit which would suggest potential cervical spine abnormality, it may be reasonable to immobilize cervical region and repeat study when patient is able to cooperate. Electronically Signed   By: Lowella Grip III M.D.   On: 05/24/2020 13:41   CT Cervical Spine Wo Contrast  Result Date: 05/24/2020 CLINICAL DATA:  Pain following fall.  Altered mental status EXAM: CT HEAD WITHOUT CONTRAST CT CERVICAL SPINE WITHOUT CONTRAST TECHNIQUE: Multidetector CT imaging of the head and cervical spine was performed following the standard protocol without intravenous contrast. Multiplanar CT image reconstructions of the cervical spine were also generated. COMPARISON:  None. FINDINGS: CT HEAD FINDINGS Motion artifact makes this study less than optimal. Brain: Ventricles and sulci are normal in size and configuration. There is no intracranial mass, hemorrhage extra-axial fluid collection, or midline shift. Brain parenchyma appears unremarkable with note of motion artifact. No acute infarct demonstrable. Vascular: No hyperdense vessel. No arterial vascular calcification is demonstrable. Skull: Bony calvarium appears intact with assessment somewhat limited due to motion. Sinuses/Orbits: Opacification noted in several ethmoid air cells and in the left sphenoid sinus. Visualized orbits appear within normal limits with limitation from motion. Other: Mastoid air cells are clear. CT CERVICAL SPINE FINDINGS Significant limitations noted due to patient motion. Alignment: No  demonstrable spondylolisthesis. There is upper thoracic levoscoliosis. Skull base and vertebrae: The skull base and craniocervical junction regions appear unremarkable. There is no appreciable fracture. Note that significant patient motion limits bony assessment with respect to potential nondisplaced fractures. No blastic or lytic lesions evident with limitation secondary to motion noted. Soft tissues and spinal canal: Prevertebral soft tissues and predental space regions appear within normal limits. No cord or canal hematoma is appreciable. No paraspinous lesions are evident. Disc levels: No obvious disc space narrowing noted. Motion artifact limits assessment of disc spaces. No disc extrusion or stenosis is appreciable. No significant exit foraminal narrowing is appreciable with assessment  of exit foraminal limited due to patient motion. Upper chest: Visualized upper lung regions appear grossly normal. Other: None IMPRESSION: CT head: Study less than optimal due to a degree of patient motion. No brain parenchymal lesions are appreciable. No evident acute infarct. No mass or hemorrhage evident. There are foci of paranasal sinus disease. CT cervical spine: Significant motion artifact limits assessment. No fracture or spondylolisthesis evident with limitations due to patient motion. No disc extrusion or stenosis evident. No overt arthropathy. Comment: If there are clinical symptoms suggesting neurologic deficit which would suggest potential cervical spine abnormality, it may be reasonable to immobilize cervical region and repeat study when patient is able to cooperate. Electronically Signed   By: Lowella Grip III M.D.   On: 05/24/2020 13:41    Procedures .Marland KitchenLaceration Repair  Date/Time: 05/24/2020 2:57 PM Performed by: Lennice Sites, DO Authorized by: Lennice Sites, DO   Consent:    Consent obtained:  Emergent situation (unable to consent due to AMS) Laceration details:    Location:  Face   Face  location:  R eyebrow   Length (cm):  2   Depth (mm):  1 Repair type:    Repair type:  Simple Exploration:    Wound extent: no areolar tissue violation noted, no fascia violation noted, no foreign bodies/material noted, no muscle damage noted, no nerve damage noted, no tendon damage noted, no underlying fracture noted and no vascular damage noted     Contaminated: no   Treatment:    Area cleansed with:  Saline   Amount of cleaning:  Standard   Irrigation solution:  Sterile saline   Irrigation method:  Pressure wash   Visualized foreign bodies/material removed: no   Skin repair:    Repair method:  Steri-Strips and tissue adhesive   Number of Steri-Strips:  2 Approximation:    Approximation:  Close Post-procedure details:    Dressing:  Open (no dressing)   Patient tolerance of procedure:  Tolerated well, no immediate complications .Critical Care Performed by: Lennice Sites, DO Authorized by: Lennice Sites, DO   Critical care provider statement:    Critical care time (minutes):  55   Critical care was necessary to treat or prevent imminent or life-threatening deterioration of the following conditions:  CNS failure or compromise   Critical care was time spent personally by me on the following activities:  Blood draw for specimens, development of treatment plan with patient or surrogate, discussions with consultants, discussions with primary provider, evaluation of patient's response to treatment, examination of patient, obtaining history from patient or surrogate, ordering and performing treatments and interventions, ordering and review of laboratory studies, ordering and review of radiographic studies, pulse oximetry, review of old charts and re-evaluation of patient's condition   I assumed direction of critical care for this patient from another provider in my specialty: no     (including critical care time)  Medications Ordered in ED Medications  cefTRIAXone (ROCEPHIN) 2 g in  sodium chloride 0.9 % 100 mL IVPB (has no administration in time range)  haloperidol lactate (HALDOL) injection (5 mg Intravenous Given 05/24/20 1235)  LORazepam (ATIVAN) 2 MG/ML injection (2 mg  Given 05/24/20 1312)  LORazepam (ATIVAN) injection 2 mg (2 mg Intravenous Given 05/24/20 1342)  sodium chloride 0.9 % bolus 1,000 mL (1,000 mLs Intravenous New Bag/Given 05/24/20 1436)  diazepam (VALIUM) injection 5 mg (5 mg Intravenous Given 05/24/20 1436)    ED Course  I have reviewed the triage vital signs and the nursing notes.  Pertinent labs & imaging results that were available during my care of the patient were reviewed by me and considered in my medical decision making (see chart for details).    MDM Rules/Calculators/A&P                          Mark Porter is a 54 year old male with incomplete paraplegia, polysubstance abuse who presents the ED with altered mental status. Patient with overall unremarkable vitals. Per EMS patient had a fall out of his wheelchair this morning. His roommates put him back in bed as he was doing fine. They came back to check on him a little bit later in the day and he is a little bit lethargic and somnolent. EMS states that he was talking with them upon their arrival. He had pinpoint pupils and in route became more apneic and gave him Narcan. Does have a history of heroin, cocaine abuse per roommates per EMS. After Narcan patient has had appears to be myoclonic jerking. No history of seizures. Does not appear consistent with seizure activity. Blood sugar is normal upon arrival. He is not following commands but does appear to move all extremities. Pupils overall appear equal and reactive. Does have a small laceration to the left eyebrow that is hemostatic. Upon chart review it does appear that he has had prescriptions in the past for baclofen and narcotics. Not sure if possibly he overdosed on one of these medications or other polysubstance abuse. He was given Versed  with EMS and still having jerking episodes. I have given an additional dose of Ativan and Haldol without much relief. Jerking appears intermittent and appears more consistent with some type of myoclonus and seizures but will have neurology evaluate. Patient does not appear to have any acute findings on head CT or neck CT. No significant anemia, electrolyte abnormality at this time. Will give fluid bolus. We will continue to allow patient to metabolize as concern for possible polysubstance overdose versus less likely seizure.  Patient having some minor improvements with Valium and Ativan.  Placed in four-point restraints due to ongoing myoclonic jerking and encephalopathy.  Dr. Theda Sers with neurology evaluated the patient and believes that this is less likely seizure activity.  Seems more consistent with a withdrawal process versus polysubstance process.  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  This seems less likely from a cord injury.  CT scan of the head and brain were unremarkable but difficult due to motion degradation.  Patient was kept in a cervical collar but is moving all his extremities.  Patient does not appear stable enough at this time for a lumbar puncture but patient does not have fever and no major white count and seems less likely infectious but neurology recommends antibiotic coverage with Rocephin.  Do not know his exact medication list but it could be a serotonin syndrome but less likely as well.  Overall patient is encephalopathic and not quite sure etiology at this time.  Will admit to the ICU for further care.  This chart was dictated using voice recognition software.  Despite best efforts to proofread,  errors can occur which can change the documentation meaning.    Final Clinical Impression(s) / ED Diagnoses Final diagnoses:  Altered mental status, unspecified altered mental status type  Encephalopathy acute    Rx / DC Orders ED Discharge Orders    None        Lennice Sites, DO 05/24/20 1548

## 2020-05-24 NOTE — ED Notes (Signed)
C Collar applied per curatolo verbal order due to motion artifact on ct scan.

## 2020-05-24 NOTE — H&P (Signed)
NAME:  Mark Porter, MRN:  [id number], DOB:  1966-05-29, LOS: 0 ADMISSION DATE:  05/24/2020, CONSULTATION DATE: 05/24/2020 REFERRING MD: Dr. Ronnald Nian, CHIEF COMPLAINT: Encephalopathy  Brief History   This is a 54 year old gentleman with a past medical history of prior cervical injury on benzos, chronic opiates, baclofen.  History of present illness   This is a 54 year old gentleman with a past medical history of prior cervical injury on benzos, chronic opiates, baclofen.  Separate chart utilized for review.  Patient encephalopathic and unable to obtain history.  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  Currently managed with Valium 5 mg 3 times daily.  Last seen in the primary care office documented in epic care everywhere 04/09/2019.  Patient was prescribed diazepam, oxycodone and naloxone.  Patient presented to the emergency department after a unwitnessed fall from a wheelchair roommate put the patient in his bed and called EMS.  He was given Narcan and developed uncontrolled muscle movements.  He was given 2.5 mg of Versed as well as Haldol and Ativan.  Patient is now in the emergency department with bed restraints and seizure precaution padding.  He was placed in a c-collar.  Labs were completed which showed normal alcohol level normal salicylate level ammonia of 48.  Also found to have a serum sodium of 138, creatinine of 0.84, AST ALT 101/103, white blood cell count 12.2.  Due to the patient's unclear presentation neurology was consulted for evaluation due to his continuous choreiform twitching movements.  Of note he also had large amount of skin lesions with various stages of healing, some open ulcerations and some crusted over.  Past Medical History   Past Medical History:  Diagnosis Date  . Cervical spinal cord injury ([place])   . Chronic prescription benzodiazepine use   . Chronic prescription opiate use      Significant Hospital  Events   ICU admission  Consults:  Neurology Pulmonary critical care  Procedures:    Significant Diagnostic Tests:    Micro Data:  COVID-19 negative  Antimicrobials:  Ceftriaxone  Interim history/subjective:  Per HPI above  Objective   Blood pressure 115/72, pulse (!) 103, temperature 99.7 F (37.6 C), temperature source Rectal, resp. rate (!) 21, height '5\' 7"'  (1.702 m), weight 63.5 kg, SpO2 95 %.        Intake/Output Summary (Last 24 hours) at 05/24/2020 1615 Last data filed at 05/24/2020 1559 Gross per 24 hour  Intake 1000 ml  Output 400 ml  Net 600 ml   Filed Weights   05/24/20 1240  Weight: 63.5 kg    Examination: General: Chronically ill-appearing, elderly male HENT: NCAT, pupils pinpoint, c-collar in place Lungs: Bilateral breath sounds, no crackles no wheeze Cardiovascular: Regular rate rhythm S1-S2 Abdomen: Soft nontender nondistended Extremities: Random movements of the lower extremities multiple skin lesions Neuro: Not alert, not following commands moves all 4 extremities randomly, GU: Deferred condom cath in place  Resolved Hospital Problem list     Assessment & Plan:   Acute metabolic, possible toxic encephalopathy History of chronic opiate and benzodiazepine use History of cervical myelopathy, chronic spasticity Possible overdose versus withdrawal from benzodiazepines Exam not necessarily consistent with seizure - Multiple medications/Substances however potentially not accounted for our ability to test for these. I have seen bathsalt OD and synthetic THC OD present this way before.  Plan: Appreciate neurology input and whether or not EEG is warranted? I suspect we are dealing with some form  of withdrawal type state or overdose type state. He needs to be observed closely for intubation need. Continue as needed benzodiazepines to help control agitation. May benefit from Precedex Drug screen pending  Monitor ECG for any interval changes,  check now and tomorrow If the patient develops respiratory depression he may need intubation   Elevated Ammonia Plan:  NGT placement  Lactulose until BM   Mildly elevated LFTs Plan: Recheck tomorrow   Multiple skin lesions in various states of healing Plan: Continue to observe, unclear if this was related to drug abuse or not  Leukocytosis Plan Likely reactive Chest x-ray ordered, definitely due to mental status at risk for aspiration.   Best practice:  Diet: N.p.o. Pain/Anxiety/Delirium protocol (if indicated): precedex  VAP protocol (if indicated): na DVT prophylaxis: lovenox GI prophylaxis: na Glucose control: cbgs Mobility: BR Code Status: FULL Family Communication: we will attempt to reach out, no answer when called  Disposition: ICU   Labs   CBC: Recent Labs  Lab 05/24/20 1245 05/24/20 1251  WBC 12.2*  --   NEUTROABS 6.3  --   HGB 12.8* 13.9  13.6  HCT 41.4 41.0  40.0  MCV 84.5  --   PLT 664*  --     Basic Metabolic Panel: Recent Labs  Lab 05/24/20 1245 05/24/20 1251  NA 138 141  141  K 4.4 4.3  4.4  CL 110 109  CO2 21*  --   GLUCOSE 86 81  BUN 17 19  CREATININE 0.84 0.70  CALCIUM 9.0  --    GFR: Estimated Creatinine Clearance: 94.8 mL/min (by C-G formula based on SCr of 0.7 mg/dL). Recent Labs  Lab 05/24/20 1245  WBC 12.2*    Liver Function Tests: Recent Labs  Lab 05/24/20 1245  AST 101*  ALT 103*  ALKPHOS 88  BILITOT 0.5  PROT 8.5*  ALBUMIN 3.0*   No results for input(s): LIPASE, AMYLASE in the last 168 hours. Recent Labs  Lab 05/24/20 1316  AMMONIA 48*    ABG    Component Value Date/Time   HCO3 21.1 05/24/2020 1251   TCO2 22 05/24/2020 1251   TCO2 19 (L) 05/24/2020 1251   ACIDBASEDEF 2.0 05/24/2020 1251   O2SAT 81.0 05/24/2020 1251     Coagulation Profile: No results for input(s): INR, PROTIME in the last 168 hours.  Cardiac Enzymes: Recent Labs  Lab 05/24/20 1245  CKTOTAL 221    HbA1C: No  results found for: HGBA1C  CBG: Recent Labs  Lab 05/24/20 1240  GLUCAP 100*    Review of Systems:   Unable to obtain.   Past Medical History  Chronic opiate use Spastic paralysis  Cervical injury   Past Medical History:  Diagnosis Date  . Cervical spinal cord injury ([place])   . Chronic prescription benzodiazepine use   . Chronic prescription opiate use      Surgical History   History reviewed. No pertinent surgical history.   Social History   reports current drug use. Drugs: Benzodiazepines and Oxycodone.   Family History   His family history is not on file.   Allergies Not on File   Home Medications  Prior to Admission medications   Medication Sig Start Date End Date Taking? Authorizing Provider  baclofen (LIORESAL) 10 MG tablet Take 10 mg by mouth 3 (three) times daily. 05/13/20   [provider]  gabapentin (NEURONTIN) 300 MG capsule Take 300 mg by mouth daily. 05/13/20   [provider]  meloxicam (MOBIC) 15  MG tablet Take 15 mg by mouth daily. 05/13/20   [provider]  NARCAN 4 MG/0.1ML LIQD nasal spray kit Place 4 mg into the nose once as needed. 04/14/20   [provider]  oxyCODONE (ROXICODONE) 15 MG immediate release tablet Take 15 mg by mouth 4 (four) times daily as needed. 03/14/20   [provider]  oxyCODONE-acetaminophen (PERCOCET) 10-325 MG tablet Take 1 tablet by mouth 4 (four) times daily as needed. 05/13/20   [provider]     This patient is critically ill with multiple organ system failure; which, requires frequent high complexity decision making, assessment, support, evaluation, and titration of therapies. This was completed through the application of advanced monitoring technologies and extensive interpretation of multiple databases. During this encounter critical care time was devoted to patient care services described in this note for 32 minutes.    Garner Nash, DO [facility]  Critical [facility] 05/24/2020 4:15 PM

## 2020-05-25 ENCOUNTER — Inpatient Hospital Stay (HOSPITAL_COMMUNITY): Payer: Medicare HMO

## 2020-05-25 DIAGNOSIS — R0600 Dyspnea, unspecified: Secondary | ICD-10-CM | POA: Diagnosis not present

## 2020-05-25 DIAGNOSIS — G9340 Encephalopathy, unspecified: Secondary | ICD-10-CM | POA: Diagnosis not present

## 2020-05-25 LAB — CBC
HCT: 36.6 % — ABNORMAL LOW (ref 39.0–52.0)
Hemoglobin: 11.8 g/dL — ABNORMAL LOW (ref 13.0–17.0)
MCH: 26.6 pg (ref 26.0–34.0)
MCHC: 32.2 g/dL (ref 30.0–36.0)
MCV: 82.6 fL (ref 80.0–100.0)
Platelets: 668 10*3/uL — ABNORMAL HIGH (ref 150–400)
RBC: 4.43 MIL/uL (ref 4.22–5.81)
RDW: 13.9 % (ref 11.5–15.5)
WBC: 12.3 10*3/uL — ABNORMAL HIGH (ref 4.0–10.5)
nRBC: 0 % (ref 0.0–0.2)

## 2020-05-25 LAB — PHOSPHORUS: Phosphorus: 3.1 mg/dL (ref 2.5–4.6)

## 2020-05-25 LAB — COMPREHENSIVE METABOLIC PANEL
ALT: 91 U/L — ABNORMAL HIGH (ref 0–44)
AST: 95 U/L — ABNORMAL HIGH (ref 15–41)
Albumin: 2.7 g/dL — ABNORMAL LOW (ref 3.5–5.0)
Alkaline Phosphatase: 81 U/L (ref 38–126)
Anion gap: 6 (ref 5–15)
BUN: 16 mg/dL (ref 6–20)
CO2: 21 mmol/L — ABNORMAL LOW (ref 22–32)
Calcium: 8.4 mg/dL — ABNORMAL LOW (ref 8.9–10.3)
Chloride: 109 mmol/L (ref 98–111)
Creatinine, Ser: 0.73 mg/dL (ref 0.61–1.24)
GFR, Estimated: >60 mL/min (ref >60)
Glucose, Bld: 74 mg/dL (ref 70–99)
Potassium: 4 mmol/L (ref 3.5–5.1)
Sodium: 136 mmol/L (ref 135–145)
Total Bilirubin: 0.6 mg/dL (ref 0.3–1.2)
Total Protein: 7.5 g/dL (ref 6.5–8.1)

## 2020-05-25 LAB — ECHOCARDIOGRAM COMPLETE
AR max vel: 2.06 cm2
AV Area VTI: 2.31 cm2
AV Area mean vel: 1.94 cm2
AV Mean grad: 3 mmHg
AV Peak grad: 5.3 mmHg
Ao pk vel: 1.15 m/s
Area-P 1/2: 4.21 cm2
Height: 67 in
S' Lateral: 2.9 cm
Weight: 2236.35 oz

## 2020-05-25 LAB — HIV ANTIBODY (ROUTINE TESTING W REFLEX): HIV Screen 4th Generation wRfx: NONREACTIVE

## 2020-05-25 LAB — GLUCOSE, CAPILLARY
Glucose-Capillary: 67 mg/dL — ABNORMAL LOW (ref 70–99)
Glucose-Capillary: 91 mg/dL (ref 70–99)
Glucose-Capillary: 78 mg/dL (ref 70–99)
Glucose-Capillary: 99 mg/dL (ref 70–99)
Glucose-Capillary: 90 mg/dL (ref 70–99)
Glucose-Capillary: 90 mg/dL (ref 70–99)
Glucose-Capillary: 90 mg/dL (ref 70–99)

## 2020-05-25 LAB — HEMOGLOBIN A1C
Hgb A1c MFr Bld: 5.8 % — ABNORMAL HIGH (ref 4.8–5.6)
Mean Plasma Glucose: 119.76 mg/dL

## 2020-05-25 LAB — MAGNESIUM: Magnesium: 2 mg/dL (ref 1.7–2.4)

## 2020-05-25 MED ORDER — FENTANYL CITRATE (PF) 100 MCG/2ML IJ SOLN
50.0000 ug | INTRAMUSCULAR | Status: DC | PRN
  Administered 2020-05-25: 50 ug via INTRAVENOUS
  Administered 2020-05-25 – 2020-05-26 (×2): 100 ug via INTRAVENOUS
  Filled 2020-05-25 (×3): qty 2

## 2020-05-25 MED ORDER — LORAZEPAM 2 MG/ML IJ SOLN
2.0000 mg | INTRAMUSCULAR | Status: DC | PRN
  Administered 2020-05-25 – 2020-05-26 (×5): 2 mg via INTRAVENOUS
  Filled 2020-05-25 (×5): qty 1

## 2020-05-25 MED ORDER — BACLOFEN 10 MG PO TABS
10.0000 mg | ORAL_TABLET | Freq: Three times a day (TID) | ORAL | Status: DC
  Administered 2020-05-26 – 2020-06-05 (×30): 10 mg via ORAL
  Filled 2020-05-25 (×36): qty 1

## 2020-05-25 MED ORDER — DEXTROSE 50 % IV SOLN
12.5000 g | INTRAVENOUS | Status: AC
  Administered 2020-05-25: 12.5 g via INTRAVENOUS

## 2020-05-25 MED ORDER — ENOXAPARIN SODIUM 40 MG/0.4ML ~~LOC~~ SOLN
40.0000 mg | SUBCUTANEOUS | Status: DC
  Administered 2020-05-25 – 2020-06-04 (×10): 40 mg via SUBCUTANEOUS
  Filled 2020-05-25 (×10): qty 0.4

## 2020-05-25 MED ORDER — GABAPENTIN 300 MG PO CAPS
300.0000 mg | ORAL_CAPSULE | Freq: Every day | ORAL | Status: DC
  Administered 2020-05-26 – 2020-06-01 (×7): 300 mg via ORAL
  Filled 2020-05-25 (×8): qty 1

## 2020-05-25 MED ORDER — DEXTROSE 10 % IV SOLN: INTRAVENOUS | Status: DC

## 2020-05-25 MED ORDER — DEXMEDETOMIDINE HCL IN NACL 400 MCG/100ML IV SOLN
0.0000 ug/kg/h | INTRAVENOUS | Status: DC
  Administered 2020-05-25: 0.4 ug/kg/h via INTRAVENOUS
  Administered 2020-05-26: 1.4 ug/kg/h via INTRAVENOUS
  Administered 2020-05-26: 0.7 ug/kg/h via INTRAVENOUS
  Administered 2020-05-27: 0.8 ug/kg/h via INTRAVENOUS
  Administered 2020-05-27: 1.4 ug/kg/h via INTRAVENOUS
  Administered 2020-05-27: 1.2 ug/kg/h via INTRAVENOUS
  Administered 2020-05-28: 0.6 ug/kg/h via INTRAVENOUS
  Administered 2020-05-28: 0.4 ug/kg/h via INTRAVENOUS
  Administered 2020-05-28: 0.6 ug/kg/h via INTRAVENOUS
  Administered 2020-05-29 – 2020-05-30 (×3): 0.4 ug/kg/h via INTRAVENOUS
  Filled 2020-05-25 (×15): qty 100

## 2020-05-25 MED ORDER — DEXTROSE-NACL 5-0.9 % IV SOLN: INTRAVENOUS | Status: DC

## 2020-05-25 MED ORDER — OXYCODONE HCL 5 MG PO TABS
15.0000 mg | ORAL_TABLET | Freq: Four times a day (QID) | ORAL | Status: DC | PRN
  Administered 2020-05-28 – 2020-06-02 (×18): 15 mg via ORAL
  Filled 2020-05-25 (×18): qty 3
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
## 2020-05-25 NOTE — Progress Notes (Addendum)
Hypoglycemic Event  CBG: 67  Treatment: D50 12.5  Symptoms: None  Follow-up CBG: Time:0400 CBG Result:091  Possible Reasons for Event: NPO  Comments/MD notified: E-link notified     Mark Porter

## 2020-05-25 NOTE — Progress Notes (Signed)
  Echocardiogram 2D Echocardiogram has been performed.  Gerda Diss 05/25/2020, 12:37 PM

## 2020-05-25 NOTE — Progress Notes (Addendum)
Patient continues to pull off condom cath and lines and leads even with wrist restraints. This RN spoke to patient and explained that he will urinate in the bed and we won't be able to take the restraints off unless he calms down. Patient stated "take this condom cath off damn it I'd rather pee in the bed and while you're at it take these fucking restraints off too". This RN left condom cath off and will clean up patient when able. Will continue to monitor patient. PRN ativan given.  2200 update: Patient began yelling "I'm peeing on myself help me". This RN went into room and asked patient if he would prefer the condom catheter over peeing on himself. Patient agreed and said he would not pull it off. Condom cath now back on patient. Will continue to monitor.

## 2020-05-25 NOTE — Progress Notes (Signed)
eLink Physician-Brief Progress Note Patient Name: Mark Porter DOB: [birth year] MRN: [id number]   Date of Service  05/25/2020  HPI/Events of Note  Hypoglycemia - Blood glucose = 66 --> 118 --> 67.  eICU Interventions  Plan: 1. D10W to run IV at 20 mL/hour.      Intervention Category Major Interventions: Other:  Lenell Antu 05/25/2020, 3:35 AM

## 2020-05-26 DIAGNOSIS — G9340 Encephalopathy, unspecified: Secondary | ICD-10-CM | POA: Diagnosis not present

## 2020-05-26 LAB — GLUCOSE, CAPILLARY
Glucose-Capillary: 93 mg/dL (ref 70–99)
Glucose-Capillary: 102 mg/dL — ABNORMAL HIGH (ref 70–99)
Glucose-Capillary: 103 mg/dL — ABNORMAL HIGH (ref 70–99)
Glucose-Capillary: 105 mg/dL — ABNORMAL HIGH (ref 70–99)

## 2020-05-26 LAB — BASIC METABOLIC PANEL
Anion gap: 7 (ref 5–15)
BUN: 13 mg/dL (ref 6–20)
CO2: 20 mmol/L — ABNORMAL LOW (ref 22–32)
Calcium: 8.3 mg/dL — ABNORMAL LOW (ref 8.9–10.3)
Chloride: 109 mmol/L (ref 98–111)
Creatinine, Ser: 0.69 mg/dL (ref 0.61–1.24)
GFR, Estimated: >60 mL/min (ref >60)
Glucose, Bld: 98 mg/dL (ref 70–99)
Potassium: 3.4 mmol/L — ABNORMAL LOW (ref 3.5–5.1)
Sodium: 136 mmol/L (ref 135–145)

## 2020-05-26 LAB — CBC
HCT: 32.9 % — ABNORMAL LOW (ref 39.0–52.0)
Hemoglobin: 11 g/dL — ABNORMAL LOW (ref 13.0–17.0)
MCH: 27 pg (ref 26.0–34.0)
MCHC: 33.4 g/dL (ref 30.0–36.0)
MCV: 80.8 fL (ref 80.0–100.0)
Platelets: 627 10*3/uL — ABNORMAL HIGH (ref 150–400)
RBC: 4.07 MIL/uL — ABNORMAL LOW (ref 4.22–5.81)
RDW: 13.5 % (ref 11.5–15.5)
WBC: 11.1 10*3/uL — ABNORMAL HIGH (ref 4.0–10.5)
nRBC: 0 % (ref 0.0–0.2)

## 2020-05-26 MED ORDER — CHLORDIAZEPOXIDE HCL 25 MG PO CAPS
25.0000 mg | ORAL_CAPSULE | Freq: Every day | ORAL | Status: AC
  Administered 2020-05-29: 25 mg via ORAL
  Filled 2020-05-26: qty 1

## 2020-05-26 MED ORDER — HYDROXYZINE HCL 25 MG PO TABS
25.0000 mg | ORAL_TABLET | Freq: Four times a day (QID) | ORAL | Status: AC | PRN
  Administered 2020-05-26: 25 mg via ORAL
  Filled 2020-05-26 (×2): qty 1

## 2020-05-26 MED ORDER — CHLORDIAZEPOXIDE HCL 25 MG PO CAPS: 25.0000 mg | ORAL_CAPSULE | Freq: Four times a day (QID) | ORAL | Status: AC | PRN

## 2020-05-26 MED ORDER — ADULT MULTIVITAMIN W/MINERALS CH
1.0000 | ORAL_TABLET | Freq: Every day | ORAL | Status: DC
  Administered 2020-05-26 – 2020-06-05 (×11): 1 via ORAL
  Filled 2020-05-26 (×11): qty 1

## 2020-05-26 MED ORDER — THIAMINE HCL 100 MG/ML IJ SOLN
100.0000 mg | Freq: Once | INTRAMUSCULAR | Status: AC
  Administered 2020-05-26: 100 mg via INTRAMUSCULAR
  Filled 2020-05-26: qty 2

## 2020-05-26 MED ORDER — LOPERAMIDE HCL 2 MG PO CAPS
2.0000 mg | ORAL_CAPSULE | ORAL | Status: AC | PRN
  Filled 2020-05-26: qty 2

## 2020-05-26 MED ORDER — CHLORDIAZEPOXIDE HCL 25 MG PO CAPS
25.0000 mg | ORAL_CAPSULE | Freq: Three times a day (TID) | ORAL | Status: AC
  Administered 2020-05-27 (×3): 25 mg via ORAL
  Filled 2020-05-26 (×3): qty 1

## 2020-05-26 MED ORDER — CHLORDIAZEPOXIDE HCL 25 MG PO CAPS
25.0000 mg | ORAL_CAPSULE | ORAL | Status: AC
  Administered 2020-05-28 (×2): 25 mg via ORAL
  Filled 2020-05-26 (×2): qty 1

## 2020-05-26 MED ORDER — CHLORDIAZEPOXIDE HCL 25 MG PO CAPS
25.0000 mg | ORAL_CAPSULE | Freq: Four times a day (QID) | ORAL | Status: AC
  Administered 2020-05-26 (×2): 25 mg via ORAL
  Filled 2020-05-26 (×2): qty 1

## 2020-05-26 MED ORDER — LORAZEPAM 2 MG/ML IJ SOLN
4.0000 mg | INTRAMUSCULAR | Status: DC | PRN
  Administered 2020-05-26 – 2020-05-28 (×4): 4 mg via INTRAVENOUS
  Filled 2020-05-26 (×4): qty 2

## 2020-05-26 MED ORDER — THIAMINE HCL 100 MG PO TABS
100.0000 mg | ORAL_TABLET | Freq: Every day | ORAL | Status: DC
  Administered 2020-05-27 – 2020-06-05 (×10): 100 mg via ORAL
  Filled 2020-05-26 (×10): qty 1

## 2020-05-26 MED ORDER — ONDANSETRON 4 MG PO TBDP: 4.0000 mg | ORAL_TABLET | Freq: Four times a day (QID) | ORAL | Status: AC | PRN

## 2020-05-26 MED ORDER — LORAZEPAM 2 MG/ML IJ SOLN
2.0000 mg | INTRAMUSCULAR | Status: DC | PRN
  Administered 2020-05-26: 2 mg via INTRAVENOUS
  Filled 2020-05-26: qty 1

## 2020-05-26 MED ORDER — POTASSIUM CHLORIDE CRYS ER 20 MEQ PO TBCR
40.0000 meq | EXTENDED_RELEASE_TABLET | Freq: Once | ORAL | Status: AC
  Administered 2020-05-26: 40 meq via ORAL
  Filled 2020-05-26: qty 2
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
## 2020-05-26 NOTE — Progress Notes (Signed)
Patient refusing temperature checks and CBG. Will chart MAR according and continue to monitor patient.

## 2020-05-27 DIAGNOSIS — G9340 Encephalopathy, unspecified: Secondary | ICD-10-CM | POA: Diagnosis not present

## 2020-05-27 LAB — COMPREHENSIVE METABOLIC PANEL
ALT: 87 U/L — ABNORMAL HIGH (ref 0–44)
AST: 89 U/L — ABNORMAL HIGH (ref 15–41)
Albumin: 2.8 g/dL — ABNORMAL LOW (ref 3.5–5.0)
Alkaline Phosphatase: 79 U/L (ref 38–126)
Anion gap: 7 (ref 5–15)
BUN: 9 mg/dL (ref 6–20)
CO2: 21 mmol/L — ABNORMAL LOW (ref 22–32)
Calcium: 8.8 mg/dL — ABNORMAL LOW (ref 8.9–10.3)
Chloride: 113 mmol/L — ABNORMAL HIGH (ref 98–111)
Creatinine, Ser: 0.71 mg/dL (ref 0.61–1.24)
GFR, Estimated: >60 mL/min (ref >60)
Glucose, Bld: 129 mg/dL — ABNORMAL HIGH (ref 70–99)
Potassium: 3.7 mmol/L (ref 3.5–5.1)
Sodium: 141 mmol/L (ref 135–145)
Total Bilirubin: 0.5 mg/dL (ref 0.3–1.2)
Total Protein: 7.9 g/dL (ref 6.5–8.1)

## 2020-05-27 LAB — GLUCOSE, CAPILLARY
Glucose-Capillary: 113 mg/dL — ABNORMAL HIGH (ref 70–99)
Glucose-Capillary: 115 mg/dL — ABNORMAL HIGH (ref 70–99)
Glucose-Capillary: 119 mg/dL — ABNORMAL HIGH (ref 70–99)
Glucose-Capillary: 119 mg/dL — ABNORMAL HIGH (ref 70–99)
Glucose-Capillary: 122 mg/dL — ABNORMAL HIGH (ref 70–99)
Glucose-Capillary: 140 mg/dL — ABNORMAL HIGH (ref 70–99)

## 2020-05-27 LAB — AMMONIA: Ammonia: 37 umol/L — ABNORMAL HIGH (ref 9–35)
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
## 2020-05-27 NOTE — TOC CAGE-AID Note (Signed)
Transition of Care [facility]) - CAGE-AID Screening   Patient Details  Name: Mark Porter MRN: [id number] Date of Birth: 06/26/1966  Transition of Care [facility] CM/SW Contact:    Levert Picket, LCSWA Phone Number: 05/27/2020, 2:19 PM   Clinical Narrative:  Pt was unable to participate in assessment due to only being oriented to person.  CAGE-AID Screening: Substance Abuse Screening unable to be completed due to: : Patient unable to participate            Isabella Stalling Clinical Social Worker [phone]

## 2020-05-28 ENCOUNTER — Inpatient Hospital Stay (HOSPITAL_COMMUNITY): Payer: Medicare HMO

## 2020-05-28 DIAGNOSIS — R451 Restlessness and agitation: Secondary | ICD-10-CM

## 2020-05-28 DIAGNOSIS — R4182 Altered mental status, unspecified: Secondary | ICD-10-CM

## 2020-05-28 DIAGNOSIS — G9340 Encephalopathy, unspecified: Secondary | ICD-10-CM | POA: Diagnosis not present

## 2020-05-28 LAB — CBC WITH DIFFERENTIAL/PLATELET
Abs Immature Granulocytes: 0.05 10*3/uL (ref 0.00–0.07)
Basophils Absolute: 0.1 10*3/uL (ref 0.0–0.1)
Basophils Relative: 0 %
Eosinophils Absolute: 0.1 10*3/uL (ref 0.0–0.5)
Eosinophils Relative: 1 %
HCT: 37.5 % — ABNORMAL LOW (ref 39.0–52.0)
Hemoglobin: 12.7 g/dL — ABNORMAL LOW (ref 13.0–17.0)
Immature Granulocytes: 0 %
Lymphocytes Relative: 16 %
Lymphs Abs: 2.1 10*3/uL (ref 0.7–4.0)
MCH: 27.1 pg (ref 26.0–34.0)
MCHC: 33.9 g/dL (ref 30.0–36.0)
MCV: 80 fL (ref 80.0–100.0)
Monocytes Absolute: 1.1 10*3/uL — ABNORMAL HIGH (ref 0.1–1.0)
Monocytes Relative: 9 %
Neutro Abs: 9.4 10*3/uL — ABNORMAL HIGH (ref 1.7–7.7)
Neutrophils Relative %: 74 %
Platelets: 556 10*3/uL — ABNORMAL HIGH (ref 150–400)
RBC: 4.69 MIL/uL (ref 4.22–5.81)
RDW: 13.7 % (ref 11.5–15.5)
WBC: 12.8 10*3/uL — ABNORMAL HIGH (ref 4.0–10.5)
nRBC: 0 % (ref 0.0–0.2)

## 2020-05-28 LAB — MAGNESIUM: Magnesium: 1.8 mg/dL (ref 1.7–2.4)

## 2020-05-28 LAB — GLUCOSE, CAPILLARY
Glucose-Capillary: 107 mg/dL — ABNORMAL HIGH (ref 70–99)
Glucose-Capillary: 112 mg/dL — ABNORMAL HIGH (ref 70–99)
Glucose-Capillary: 81 mg/dL (ref 70–99)
Glucose-Capillary: 92 mg/dL (ref 70–99)

## 2020-05-28 LAB — PHOSPHORUS: Phosphorus: 2.5 mg/dL (ref 2.5–4.6)

## 2020-05-28 MED ORDER — HALOPERIDOL LACTATE 5 MG/ML IJ SOLN
10.0000 mg | Freq: Once | INTRAMUSCULAR | Status: AC
  Administered 2020-05-28: 10 mg via INTRAMUSCULAR
  Filled 2020-05-28: qty 2

## 2020-05-28 MED ORDER — LORAZEPAM 2 MG/ML IJ SOLN
4.0000 mg | INTRAMUSCULAR | Status: DC | PRN
  Administered 2020-05-28 (×2): 4 mg via INTRAVENOUS
  Administered 2020-05-29 (×2): 2 mg via INTRAVENOUS
  Administered 2020-05-29 – 2020-05-31 (×6): 4 mg via INTRAVENOUS
  Filled 2020-05-28 (×9): qty 2

## 2020-05-28 MED ORDER — HALOPERIDOL 5 MG PO TABS
5.0000 mg | ORAL_TABLET | Freq: Two times a day (BID) | ORAL | Status: DC
  Administered 2020-05-28: 5 mg via ORAL
  Filled 2020-05-28 (×2): qty 1

## 2020-05-28 MED ORDER — ZIPRASIDONE MESYLATE 20 MG IM SOLR
20.0000 mg | Freq: Once | INTRAMUSCULAR | Status: DC
  Filled 2020-05-28: qty 20

## 2020-05-28 MED ORDER — IBUPROFEN 200 MG PO TABS
400.0000 mg | ORAL_TABLET | Freq: Four times a day (QID) | ORAL | Status: DC | PRN
  Administered 2020-06-01 – 2020-06-05 (×8): 400 mg via ORAL
  Filled 2020-05-28 (×8): qty 2
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
## 2020-05-28 NOTE — Progress Notes (Signed)
Patient complaining of 10/10 pain in inguinal region. Bladder scan showed 178 ml in bladder. Notified Dr. Denese Killings and received new order for motrin (see MAR). Patient refused motrin and said he "would wait for oxy". Patient also repeating that "when I regain my powers, I will kill them" and "there is a black tiger out there" pointing to the window. Will reassess patient's pain, prn ativan given (see MAR).
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
## 2020-05-28 NOTE — Progress Notes (Signed)
Spoke with Victorino Dike RN re PIV consult.  Staets pt is agitated currently and will securechat when pt is ready for PIV attempt.
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
## 2020-05-29 DIAGNOSIS — G9340 Encephalopathy, unspecified: Secondary | ICD-10-CM | POA: Diagnosis not present

## 2020-05-29 DIAGNOSIS — R451 Restlessness and agitation: Secondary | ICD-10-CM

## 2020-05-29 DIAGNOSIS — R410 Disorientation, unspecified: Secondary | ICD-10-CM | POA: Diagnosis not present

## 2020-05-29 LAB — GLUCOSE, CAPILLARY
Glucose-Capillary: 110 mg/dL — ABNORMAL HIGH (ref 70–99)
Glucose-Capillary: 108 mg/dL — ABNORMAL HIGH (ref 70–99)
Glucose-Capillary: 96 mg/dL (ref 70–99)
Glucose-Capillary: 93 mg/dL (ref 70–99)
Glucose-Capillary: 177 mg/dL — ABNORMAL HIGH (ref 70–99)

## 2020-05-29 LAB — CBC
HCT: 34.2 % — ABNORMAL LOW (ref 39.0–52.0)
Hemoglobin: 10.9 g/dL — ABNORMAL LOW (ref 13.0–17.0)
MCH: 26.2 pg (ref 26.0–34.0)
MCHC: 31.9 g/dL (ref 30.0–36.0)
MCV: 82.2 fL (ref 80.0–100.0)
Platelets: 561 10*3/uL — ABNORMAL HIGH (ref 150–400)
RBC: 4.16 MIL/uL — ABNORMAL LOW (ref 4.22–5.81)
RDW: 14.2 % (ref 11.5–15.5)
WBC: 14.2 10*3/uL — ABNORMAL HIGH (ref 4.0–10.5)
nRBC: 0 % (ref 0.0–0.2)

## 2020-05-29 LAB — HEPATIC FUNCTION PANEL
ALT: 67 U/L — ABNORMAL HIGH (ref 0–44)
AST: 58 U/L — ABNORMAL HIGH (ref 15–41)
Albumin: 2.3 g/dL — ABNORMAL LOW (ref 3.5–5.0)
Alkaline Phosphatase: 80 U/L (ref 38–126)
Bilirubin, Direct: <0.1 mg/dL (ref 0.0–0.2)
Total Bilirubin: 0.6 mg/dL (ref 0.3–1.2)
Total Protein: 6.7 g/dL (ref 6.5–8.1)

## 2020-05-29 LAB — BASIC METABOLIC PANEL
Anion gap: 9 (ref 5–15)
BUN: 8 mg/dL (ref 6–20)
CO2: 17 mmol/L — ABNORMAL LOW (ref 22–32)
Calcium: 8.1 mg/dL — ABNORMAL LOW (ref 8.9–10.3)
Chloride: 114 mmol/L — ABNORMAL HIGH (ref 98–111)
Creatinine, Ser: 0.61 mg/dL (ref 0.61–1.24)
GFR, Estimated: >60 mL/min (ref >60)
Glucose, Bld: 112 mg/dL — ABNORMAL HIGH (ref 70–99)
Potassium: 3.2 mmol/L — ABNORMAL LOW (ref 3.5–5.1)
Sodium: 140 mmol/L (ref 135–145)

## 2020-05-29 LAB — MAGNESIUM: Magnesium: 1.6 mg/dL — ABNORMAL LOW (ref 1.7–2.4)

## 2020-05-29 LAB — PHOSPHORUS: Phosphorus: 3.3 mg/dL (ref 2.5–4.6)

## 2020-05-29 MED ORDER — QUETIAPINE FUMARATE 25 MG PO TABS: 50.0000 mg | ORAL_TABLET | Freq: Every day | ORAL | Status: DC

## 2020-05-29 MED ORDER — QUETIAPINE FUMARATE 25 MG PO TABS
50.0000 mg | ORAL_TABLET | Freq: Two times a day (BID) | ORAL | Status: DC
  Administered 2020-05-29 – 2020-06-05 (×15): 50 mg via ORAL
  Filled 2020-05-29 (×15): qty 2

## 2020-05-29 MED ORDER — NICOTINE 14 MG/24HR TD PT24
14.0000 mg | MEDICATED_PATCH | Freq: Every day | TRANSDERMAL | Status: DC
  Administered 2020-05-29 – 2020-06-05 (×7): 14 mg via TRANSDERMAL
  Filled 2020-05-29 (×7): qty 1

## 2020-05-29 NOTE — Care Management Important Message (Signed)
Important Message  Patient Details  Name: Mark Porter MRN: [id number] Date of Birth: [birth year]   Medicare Important Message Given:  Yes     Dorena Bodo 05/29/2020, 3:26 PM
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
## 2020-05-30 DIAGNOSIS — R451 Restlessness and agitation: Secondary | ICD-10-CM | POA: Diagnosis not present

## 2020-05-30 DIAGNOSIS — G9340 Encephalopathy, unspecified: Secondary | ICD-10-CM | POA: Diagnosis not present

## 2020-05-30 LAB — GLUCOSE, CAPILLARY
Glucose-Capillary: 145 mg/dL — ABNORMAL HIGH (ref 70–99)
Glucose-Capillary: 136 mg/dL — ABNORMAL HIGH (ref 70–99)
Glucose-Capillary: 126 mg/dL — ABNORMAL HIGH (ref 70–99)
Glucose-Capillary: 107 mg/dL — ABNORMAL HIGH (ref 70–99)
Glucose-Capillary: 97 mg/dL (ref 70–99)
Glucose-Capillary: 110 mg/dL — ABNORMAL HIGH (ref 70–99)

## 2020-05-30 MED ORDER — POTASSIUM CHLORIDE 10 MEQ/100ML IV SOLN
10.0000 meq | INTRAVENOUS | Status: AC
  Administered 2020-05-30 (×4): 10 meq via INTRAVENOUS
  Filled 2020-05-30 (×4): qty 100

## 2020-05-30 MED ORDER — CLONIDINE HCL 0.1 MG PO TABS
0.1000 mg | ORAL_TABLET | Freq: Four times a day (QID) | ORAL | Status: DC
  Administered 2020-05-30 – 2020-05-31 (×4): 0.1 mg via ORAL
  Filled 2020-05-30 (×4): qty 1

## 2020-05-30 MED ORDER — MAGNESIUM SULFATE 4 GM/100ML IV SOLN
4.0000 g | Freq: Once | INTRAVENOUS | Status: AC
  Administered 2020-05-30: 4 g via INTRAVENOUS
  Filled 2020-05-30: qty 100

## 2020-05-30 MED ORDER — POTASSIUM CHLORIDE CRYS ER 20 MEQ PO TBCR
20.0000 meq | EXTENDED_RELEASE_TABLET | ORAL | Status: AC
  Administered 2020-05-30 (×2): 20 meq via ORAL
  Filled 2020-05-30 (×2): qty 1

## 2020-05-30 MED ORDER — SODIUM CHLORIDE 0.9 % IV SOLN
INTRAVENOUS | Status: DC | PRN
  Administered 2020-05-30: 500 mL via INTRAVENOUS

## 2020-05-30 NOTE — Progress Notes (Signed)
Pharmacy Electrolyte Replacement  Recent Labs:  Recent Labs    05/29/20 0643  K 3.2*  MG 1.6*  PHOS 3.3  CREATININE 0.61    Low Critical Values (K </= 2.5, Phos </= 1, Mg </= 1) Present: None  MD Contacted: Dr. Delton Coombes  Plan: KCl PO x 2 doses + K runs x 4 Mag sulfate 4g IV x 1 F/u repeat K and Mag with AM labs   Leia Alf, PharmD, BCPS Please check AMION for all [facility] contact numbers Clinical Pharmacist 05/30/2020 2:26 PM

## 2020-05-30 NOTE — TOC Initial Note (Signed)
Transition of Care [facility]) - Initial/Assessment Note    Patient Details  Name: Mark Porter MRN: [id number] Date of Birth: 19-Oct-1965  [facility] CM/SW Contact:    Glennon Mac, RN Phone Number: 05/30/2020, 4:03 PM  Clinical Narrative: Patient admitted on 05/24/2020 with agitation and encephalopathy, with fall from his wheelchair.  Pt with hx of previous cervical spinal cord injury.  Prior to admission, patient wheelchair-bound and living at home with his stepbrother.  He states that he is independent in wheelchair, and able to perform all ADLs without difficulty at baseline.  He states that he does receive assistance from his stepbrother and his friend, "Shine".  PT/OT evaluations are pending at this time; will follow for discharge needs as patient progresses.  Expected Discharge Plan: Home w Home Health Services Barriers to Discharge: Continued Medical Work up           Expected Discharge Plan and Services Expected Discharge Plan: Home w Home Health Services   Discharge Planning Services: CM Consult   Living arrangements for the past 2 months: Single Family Home                                      Prior Living Arrangements/Services Living arrangements for the past 2 months: Single Family Home Lives with:: Relatives Patient language and need for interpreter reviewed:: Yes Do you feel safe going back to the place where you live?: Yes      Need for Family Participation in Patient Care: Yes (Comment) Care giver support system in place?: Yes (comment) Current home services: DME Criminal Activity/Legal Involvement Pertinent to Current Situation/Hospitalization: No - Comment as needed               Emotional Assessment Appearance:: Appears older than stated age Attitude/Demeanor/Rapport: Engaged Affect (typically observed): Accepting Orientation: : Oriented to Self, Oriented to Place, Oriented to  Time, Oriented to Situation      Admission  diagnosis:  Leukocytosis [D72.829] Encephalopathy acute [G93.40] Acute encephalopathy [G93.40] Altered mental status, unspecified altered mental status type [R41.82] Patient Active Problem List   Diagnosis Date Noted  . Altered mental status   . Agitation   . Encephalopathy acute 05/24/2020   PCP:  Devra Dopp, MD Pharmacy:  No Pharmacies Listed    Social Determinants of Health (SDOH) Interventions    Readmission Risk Interventions No flowsheet data found.  Quintella Baton, RN, BSN  Trauma/Neuro ICU Case Manager [phone]
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
## 2020-05-31 DIAGNOSIS — R451 Restlessness and agitation: Secondary | ICD-10-CM | POA: Diagnosis not present

## 2020-05-31 DIAGNOSIS — G9340 Encephalopathy, unspecified: Secondary | ICD-10-CM | POA: Diagnosis not present

## 2020-05-31 LAB — BASIC METABOLIC PANEL
Anion gap: 9 (ref 5–15)
BUN: 7 mg/dL (ref 6–20)
CO2: 21 mmol/L — ABNORMAL LOW (ref 22–32)
Calcium: 7.9 mg/dL — ABNORMAL LOW (ref 8.9–10.3)
Chloride: 111 mmol/L (ref 98–111)
Creatinine, Ser: 0.66 mg/dL (ref 0.61–1.24)
GFR, Estimated: >60 mL/min (ref >60)
Glucose, Bld: 94 mg/dL (ref 70–99)
Potassium: 3.7 mmol/L (ref 3.5–5.1)
Sodium: 141 mmol/L (ref 135–145)

## 2020-05-31 LAB — CBC
HCT: 33.9 % — ABNORMAL LOW (ref 39.0–52.0)
Hemoglobin: 11 g/dL — ABNORMAL LOW (ref 13.0–17.0)
MCH: 26.6 pg (ref 26.0–34.0)
MCHC: 32.4 g/dL (ref 30.0–36.0)
MCV: 82.1 fL (ref 80.0–100.0)
Platelets: 555 10*3/uL — ABNORMAL HIGH (ref 150–400)
RBC: 4.13 MIL/uL — ABNORMAL LOW (ref 4.22–5.81)
RDW: 14.6 % (ref 11.5–15.5)
WBC: 13.2 10*3/uL — ABNORMAL HIGH (ref 4.0–10.5)
nRBC: 0 % (ref 0.0–0.2)

## 2020-05-31 LAB — GLUCOSE, CAPILLARY
Glucose-Capillary: 98 mg/dL (ref 70–99)
Glucose-Capillary: 114 mg/dL — ABNORMAL HIGH (ref 70–99)
Glucose-Capillary: 92 mg/dL (ref 70–99)
Glucose-Capillary: 135 mg/dL — ABNORMAL HIGH (ref 70–99)
Glucose-Capillary: 99 mg/dL (ref 70–99)

## 2020-05-31 LAB — PHOSPHORUS: Phosphorus: 3.1 mg/dL (ref 2.5–4.6)

## 2020-05-31 LAB — MAGNESIUM: Magnesium: 2.1 mg/dL (ref 1.7–2.4)

## 2020-05-31 MED ORDER — CLONIDINE HCL 0.1 MG PO TABS
0.1000 mg | ORAL_TABLET | Freq: Two times a day (BID) | ORAL | Status: DC
  Administered 2020-05-31 (×2): 0.1 mg via ORAL
  Filled 2020-05-31 (×3): qty 1
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
## 2020-06-01 DIAGNOSIS — R4182 Altered mental status, unspecified: Secondary | ICD-10-CM | POA: Diagnosis not present

## 2020-06-01 LAB — GLUCOSE, CAPILLARY
Glucose-Capillary: 118 mg/dL — ABNORMAL HIGH (ref 70–99)
Glucose-Capillary: 108 mg/dL — ABNORMAL HIGH (ref 70–99)
Glucose-Capillary: 90 mg/dL (ref 70–99)
Glucose-Capillary: 111 mg/dL — ABNORMAL HIGH (ref 70–99)

## 2020-06-01 MED ORDER — SODIUM CHLORIDE 0.9 % IV SOLN: INTRAVENOUS | Status: DC

## 2020-06-01 MED ORDER — CLONIDINE HCL 0.1 MG PO TABS: 0.1000 mg | ORAL_TABLET | Freq: Every day | ORAL | Status: DC

## 2020-06-01 MED ORDER — SODIUM CHLORIDE 0.9 % IV BOLUS
1000.0000 mL | Freq: Once | INTRAVENOUS | Status: AC
  Administered 2020-06-01: 1000 mL via INTRAVENOUS
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
## 2020-06-01 NOTE — Progress Notes (Signed)
Patient transferred to 5N at this time. Report given to [facility].All belongings taken- clothes and cell phone. VSS.
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
## 2020-06-02 DIAGNOSIS — R4182 Altered mental status, unspecified: Secondary | ICD-10-CM | POA: Diagnosis not present

## 2020-06-02 LAB — GLUCOSE, CAPILLARY
Glucose-Capillary: 102 mg/dL — ABNORMAL HIGH (ref 70–99)
Glucose-Capillary: 126 mg/dL — ABNORMAL HIGH (ref 70–99)
Glucose-Capillary: 130 mg/dL — ABNORMAL HIGH (ref 70–99)
Glucose-Capillary: 427 mg/dL — ABNORMAL HIGH (ref 70–99)
Glucose-Capillary: 93 mg/dL (ref 70–99)
Glucose-Capillary: 94 mg/dL (ref 70–99)
Glucose-Capillary: 95 mg/dL (ref 70–99)

## 2020-06-02 MED ORDER — OXYCODONE HCL 5 MG PO TABS
5.0000 mg | ORAL_TABLET | Freq: Four times a day (QID) | ORAL | Status: DC | PRN
  Administered 2020-06-02 (×3): 5 mg via ORAL
  Filled 2020-06-02 (×3): qty 1

## 2020-06-02 MED ORDER — DOCUSATE SODIUM 100 MG PO CAPS
100.0000 mg | ORAL_CAPSULE | Freq: Two times a day (BID) | ORAL | Status: DC
  Administered 2020-06-02 – 2020-06-05 (×7): 100 mg via ORAL
  Filled 2020-06-02 (×7): qty 1

## 2020-06-02 MED ORDER — OXYCODONE-ACETAMINOPHEN 5-325 MG PO TABS
1.0000 | ORAL_TABLET | Freq: Four times a day (QID) | ORAL | Status: DC | PRN
  Administered 2020-06-02 – 2020-06-03 (×4): 1 via ORAL
  Filled 2020-06-02 (×4): qty 1

## 2020-06-02 MED ORDER — MELOXICAM 7.5 MG PO TABS
15.0000 mg | ORAL_TABLET | Freq: Every day | ORAL | Status: DC
  Administered 2020-06-02 – 2020-06-05 (×4): 15 mg via ORAL
  Filled 2020-06-02 (×4): qty 2

## 2020-06-02 MED ORDER — GABAPENTIN 300 MG PO CAPS
300.0000 mg | ORAL_CAPSULE | Freq: Two times a day (BID) | ORAL | Status: DC
  Administered 2020-06-02 (×2): 300 mg via ORAL
  Filled 2020-06-02 (×2): qty 1

## 2020-06-02 MED ORDER — BUTALBITAL-APAP-CAFFEINE 50-325-40 MG PO TABS
1.0000 | ORAL_TABLET | Freq: Once | ORAL | Status: AC
  Administered 2020-06-02: 1 via ORAL
  Filled 2020-06-02: qty 1
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
## 2020-06-02 NOTE — Care Management Important Message (Signed)
Important Message  Patient Details  Name: Mark Porter MRN: [id number] Date of Birth: Aug 14, 1965   Medicare Important Message Given:  Yes      P  06/02/2020, 3:09 PM

## 2020-06-02 NOTE — Plan of Care (Signed)
  Problem: Education: Goal: Knowledge of General Education information will improve Description Including pain rating scale, medication(s)/side effects and non-pharmacologic comfort measures Outcome: Progressing   

## 2020-06-03 ENCOUNTER — Other Ambulatory Visit: Payer: Self-pay

## 2020-06-03 ENCOUNTER — Encounter (HOSPITAL_COMMUNITY): Payer: Self-pay | Admitting: Pulmonary Disease

## 2020-06-03 DIAGNOSIS — R4182 Altered mental status, unspecified: Secondary | ICD-10-CM | POA: Diagnosis not present

## 2020-06-03 LAB — GLUCOSE, CAPILLARY
Glucose-Capillary: 100 mg/dL — ABNORMAL HIGH (ref 70–99)
Glucose-Capillary: 103 mg/dL — ABNORMAL HIGH (ref 70–99)
Glucose-Capillary: 112 mg/dL — ABNORMAL HIGH (ref 70–99)
Glucose-Capillary: 118 mg/dL — ABNORMAL HIGH (ref 70–99)
Glucose-Capillary: 124 mg/dL — ABNORMAL HIGH (ref 70–99)
Glucose-Capillary: 98 mg/dL (ref 70–99)

## 2020-06-03 LAB — CBC
HCT: 34.5 % — ABNORMAL LOW (ref 39.0–52.0)
Hemoglobin: 11.1 g/dL — ABNORMAL LOW (ref 13.0–17.0)
MCH: 26.4 pg (ref 26.0–34.0)
MCHC: 32.2 g/dL (ref 30.0–36.0)
MCV: 82.1 fL (ref 80.0–100.0)
Platelets: 597 10*3/uL — ABNORMAL HIGH (ref 150–400)
RBC: 4.2 MIL/uL — ABNORMAL LOW (ref 4.22–5.81)
RDW: 14.4 % (ref 11.5–15.5)
WBC: 9.6 10*3/uL (ref 4.0–10.5)
nRBC: 0 % (ref 0.0–0.2)

## 2020-06-03 LAB — BASIC METABOLIC PANEL
Anion gap: 8 (ref 5–15)
BUN: 11 mg/dL (ref 6–20)
CO2: 23 mmol/L (ref 22–32)
Calcium: 8.3 mg/dL — ABNORMAL LOW (ref 8.9–10.3)
Chloride: 108 mmol/L (ref 98–111)
Creatinine, Ser: 0.71 mg/dL (ref 0.61–1.24)
GFR, Estimated: >60 mL/min (ref >60)
Glucose, Bld: 99 mg/dL (ref 70–99)
Potassium: 3.8 mmol/L (ref 3.5–5.1)
Sodium: 139 mmol/L (ref 135–145)

## 2020-06-03 MED ORDER — HALOPERIDOL LACTATE 5 MG/ML IJ SOLN
2.0000 mg | Freq: Four times a day (QID) | INTRAMUSCULAR | Status: DC | PRN
  Administered 2020-06-03: 2 mg via INTRAVENOUS
  Filled 2020-06-03: qty 1

## 2020-06-03 MED ORDER — LORAZEPAM 2 MG/ML IJ SOLN: 0.5000 mg | INTRAMUSCULAR | Status: DC | PRN

## 2020-06-03 MED ORDER — GABAPENTIN 300 MG PO CAPS
300.0000 mg | ORAL_CAPSULE | Freq: Three times a day (TID) | ORAL | Status: DC
  Administered 2020-06-03 – 2020-06-05 (×6): 300 mg via ORAL
  Filled 2020-06-03 (×6): qty 1

## 2020-06-03 MED ORDER — OXYCODONE HCL 5 MG PO TABS
10.0000 mg | ORAL_TABLET | Freq: Four times a day (QID) | ORAL | Status: DC | PRN
  Administered 2020-06-03 – 2020-06-04 (×5): 10 mg via ORAL
  Filled 2020-06-03 (×5): qty 2

## 2020-06-03 NOTE — Plan of Care (Signed)
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
## 2020-06-04 DIAGNOSIS — R410 Disorientation, unspecified: Secondary | ICD-10-CM | POA: Diagnosis not present

## 2020-06-04 DIAGNOSIS — R451 Restlessness and agitation: Secondary | ICD-10-CM | POA: Diagnosis not present

## 2020-06-04 DIAGNOSIS — G9340 Encephalopathy, unspecified: Secondary | ICD-10-CM | POA: Diagnosis not present

## 2020-06-04 LAB — GLUCOSE, CAPILLARY
Glucose-Capillary: 78 mg/dL (ref 70–99)
Glucose-Capillary: 129 mg/dL — ABNORMAL HIGH (ref 70–99)
Glucose-Capillary: 90 mg/dL (ref 70–99)
Glucose-Capillary: 117 mg/dL — ABNORMAL HIGH (ref 70–99)
Glucose-Capillary: 127 mg/dL — ABNORMAL HIGH (ref 70–99)

## 2020-06-04 MED ORDER — OXYCODONE HCL 5 MG PO TABS
10.0000 mg | ORAL_TABLET | ORAL | Status: DC | PRN
  Administered 2020-06-04 – 2020-06-05 (×6): 10 mg via ORAL
  Filled 2020-06-04 (×6): qty 2

## 2020-06-04 NOTE — Plan of Care (Signed)
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
## 2020-06-05 DIAGNOSIS — R410 Disorientation, unspecified: Secondary | ICD-10-CM | POA: Diagnosis not present

## 2020-06-05 DIAGNOSIS — R451 Restlessness and agitation: Secondary | ICD-10-CM | POA: Diagnosis not present

## 2020-06-05 DIAGNOSIS — G9340 Encephalopathy, unspecified: Secondary | ICD-10-CM | POA: Diagnosis not present

## 2020-06-05 LAB — CBC
HCT: 37.4 % — ABNORMAL LOW (ref 39.0–52.0)
Hemoglobin: 12.1 g/dL — ABNORMAL LOW (ref 13.0–17.0)
MCH: 27.1 pg (ref 26.0–34.0)
MCHC: 32.4 g/dL (ref 30.0–36.0)
MCV: 83.9 fL (ref 80.0–100.0)
Platelets: 502 10*3/uL — ABNORMAL HIGH (ref 150–400)
RBC: 4.46 MIL/uL (ref 4.22–5.81)
RDW: 14.6 % (ref 11.5–15.5)
WBC: 11.6 10*3/uL — ABNORMAL HIGH (ref 4.0–10.5)
nRBC: 0 % (ref 0.0–0.2)

## 2020-06-05 LAB — GLUCOSE, CAPILLARY
Glucose-Capillary: 109 mg/dL — ABNORMAL HIGH (ref 70–99)
Glucose-Capillary: 164 mg/dL — ABNORMAL HIGH (ref 70–99)
Glucose-Capillary: 94 mg/dL (ref 70–99)
Glucose-Capillary: 97 mg/dL (ref 70–99)

## 2020-06-05 LAB — BASIC METABOLIC PANEL
Anion gap: 10 (ref 5–15)
BUN: 15 mg/dL (ref 6–20)
CO2: 20 mmol/L — ABNORMAL LOW (ref 22–32)
Calcium: 8.4 mg/dL — ABNORMAL LOW (ref 8.9–10.3)
Chloride: 107 mmol/L (ref 98–111)
Creatinine, Ser: 0.79 mg/dL (ref 0.61–1.24)
GFR, Estimated: >60 mL/min (ref >60)
Glucose, Bld: 93 mg/dL (ref 70–99)
Potassium: 4.7 mmol/L (ref 3.5–5.1)
Sodium: 137 mmol/L (ref 135–145)

## 2020-06-05 MED ORDER — ADULT MULTIVITAMIN W/MINERALS CH
1.0000 | ORAL_TABLET | Freq: Every day | ORAL | 0 refills | Status: DC
Start: 2020-06-06 — End: 2023-02-14

## 2020-06-05 MED ORDER — GABAPENTIN 300 MG PO CAPS
300.0000 mg | ORAL_CAPSULE | Freq: Three times a day (TID) | ORAL | 0 refills | Status: DC
Start: 2020-06-05 — End: 2023-02-14

## 2020-06-05 MED ORDER — THIAMINE HCL 100 MG PO TABS
100.0000 mg | ORAL_TABLET | Freq: Every day | ORAL | 0 refills | Status: DC
Start: 2020-06-06 — End: 2023-02-14

## 2020-06-05 MED ORDER — QUETIAPINE FUMARATE 50 MG PO TABS
50.0000 mg | ORAL_TABLET | Freq: Two times a day (BID) | ORAL | 0 refills | Status: DC
Start: 2020-06-05 — End: 2023-02-14
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
## 2020-06-05 NOTE — Progress Notes (Signed)
Completed paperwork for ambulance transportation. Paperwork given to Licensed conveyancer. Spoke to [place], Charity fundraiser and she reports that she will call [facility] to arrange the transportation once pt is ready to be D/C.

## 2020-06-05 NOTE — Plan of Care (Signed)

## 2020-06-05 NOTE — Progress Notes (Signed)
Mark Porter to be Discharged home per MD order.  Discussed prescriptions with the patient. 1 Prescription printed and given to patient the rest were sent to patient's preferred pharmacy, medication list explained in detail. Pt verbalized understanding.  Allergies as of 06/05/2020   No Known Allergies     Medication List    TAKE these medications   baclofen 10 MG tablet Commonly known as: LIORESAL Take 10 mg by mouth 3 (three) times daily.   gabapentin 300 MG capsule Commonly known as: NEURONTIN Take 1 capsule (300 mg total) by mouth 3 (three) times daily. What changed: when to take this   meloxicam 15 MG tablet Commonly known as: MOBIC Take 15 mg by mouth daily.   multivitamin with minerals Tabs tablet Take 1 tablet by mouth daily. Start taking on: June 06, 2020   Narcan 4 MG/0.1ML Liqd nasal spray kit Generic drug: naloxone Place 4 mg into the nose once as needed.   oxyCODONE 15 MG immediate release tablet Commonly known as: ROXICODONE Take 15 mg by mouth 4 (four) times daily as needed.   oxyCODONE-acetaminophen 10-325 MG tablet Commonly known as: PERCOCET Take 1 tablet by mouth 4 (four) times daily as needed.   QUEtiapine 50 MG tablet Commonly known as: SEROQUEL Take 1 tablet (50 mg total) by mouth 2 (two) times daily.   thiamine 100 MG tablet Take 1 tablet (100 mg total) by mouth daily. Start taking on: June 06, 2020       Vitals:   06/05/20 0400 06/05/20 0752  BP: 131/83 (!) 139/108  Pulse: 83 98  Resp: 16 17  Temp: 98.7 F (37.1 C) 98 F (36.7 C)  SpO2: 97% 99%    IV catheter discontinued. Site without signs and symptoms of complications. Dressing and pressure applied. Pain controlled at the time by PRN oxycodone.  An After Visit Summary was printed and given to the patient. Patient picked up by [facility] to transport to his residence.  [place] 06/05/2020 3:49 PM
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
## 2020-06-09 ENCOUNTER — Encounter (HOSPITAL_COMMUNITY): Payer: Self-pay | Admitting: Otolaryngology

## 2020-10-12 ENCOUNTER — Emergency Department (HOSPITAL_COMMUNITY): Payer: Medicare HMO

## 2020-10-12 ENCOUNTER — Encounter (HOSPITAL_COMMUNITY): Payer: Self-pay | Admitting: Radiology

## 2020-10-12 ENCOUNTER — Emergency Department (HOSPITAL_COMMUNITY)
Admission: EM | Admit: 2020-10-12 | Discharge: 2020-10-12 | Disposition: A | Discharge status: Home / Self Care | Payer: Medicare HMO | Attending: Emergency Medicine | Admitting: Emergency Medicine

## 2020-10-12 DIAGNOSIS — R4182 Altered mental status, unspecified: Secondary | ICD-10-CM | POA: Insufficient documentation

## 2020-10-12 DIAGNOSIS — S81802A Unspecified open wound, left lower leg, initial encounter: Secondary | ICD-10-CM | POA: Insufficient documentation

## 2020-10-12 DIAGNOSIS — X58XXXA Exposure to other specified factors, initial encounter: Secondary | ICD-10-CM | POA: Insufficient documentation

## 2020-10-12 DIAGNOSIS — S81801A Unspecified open wound, right lower leg, initial encounter: Secondary | ICD-10-CM | POA: Diagnosis not present

## 2020-10-12 DIAGNOSIS — L539 Erythematous condition, unspecified: Secondary | ICD-10-CM | POA: Insufficient documentation

## 2020-10-12 DIAGNOSIS — Z79899 Other long term (current) drug therapy: Secondary | ICD-10-CM | POA: Insufficient documentation

## 2020-10-12 DIAGNOSIS — S8991XA Unspecified injury of right lower leg, initial encounter: Secondary | ICD-10-CM | POA: Diagnosis present

## 2020-10-12 DIAGNOSIS — R0603 Acute respiratory distress: Secondary | ICD-10-CM | POA: Diagnosis not present

## 2020-10-12 LAB — CBC WITH DIFFERENTIAL/PLATELET
Abs Immature Granulocytes: 0.04 10*3/uL (ref 0.00–0.07)
Basophils Absolute: 0.1 10*3/uL (ref 0.0–0.1)
Basophils Relative: 1 %
Eosinophils Absolute: 0.4 10*3/uL (ref 0.0–0.5)
Eosinophils Relative: 3 %
HCT: 31 % — ABNORMAL LOW (ref 39.0–52.0)
Hemoglobin: 10.1 g/dL — ABNORMAL LOW (ref 13.0–17.0)
Immature Granulocytes: 0 %
Lymphocytes Relative: 21 %
Lymphs Abs: 2.7 10*3/uL (ref 0.7–4.0)
MCH: 26.9 pg (ref 26.0–34.0)
MCHC: 32.6 g/dL (ref 30.0–36.0)
MCV: 82.4 fL (ref 80.0–100.0)
Monocytes Absolute: 1.4 10*3/uL — ABNORMAL HIGH (ref 0.1–1.0)
Monocytes Relative: 11 %
Neutro Abs: 8.1 10*3/uL — ABNORMAL HIGH (ref 1.7–7.7)
Neutrophils Relative %: 64 %
Platelets: 572 10*3/uL — ABNORMAL HIGH (ref 150–400)
RBC: 3.76 MIL/uL — ABNORMAL LOW (ref 4.22–5.81)
RDW: 14.3 % (ref 11.5–15.5)
WBC: 12.6 10*3/uL — ABNORMAL HIGH (ref 4.0–10.5)
nRBC: 0 % (ref 0.0–0.2)

## 2020-10-12 LAB — COMPREHENSIVE METABOLIC PANEL
ALT: 35 U/L (ref 0–44)
AST: 53 U/L — ABNORMAL HIGH (ref 15–41)
Albumin: 2.5 g/dL — ABNORMAL LOW (ref 3.5–5.0)
Alkaline Phosphatase: 69 U/L (ref 38–126)
Anion gap: 6 (ref 5–15)
BUN: 14 mg/dL (ref 6–20)
CO2: 23 mmol/L (ref 22–32)
Calcium: 8.2 mg/dL — ABNORMAL LOW (ref 8.9–10.3)
Chloride: 106 mmol/L (ref 98–111)
Creatinine, Ser: 0.82 mg/dL (ref 0.61–1.24)
GFR, Estimated: >60 mL/min (ref >60)
Glucose, Bld: 161 mg/dL — ABNORMAL HIGH (ref 70–99)
Potassium: 3.5 mmol/L (ref 3.5–5.1)
Sodium: 135 mmol/L (ref 135–145)
Total Bilirubin: 0.5 mg/dL (ref 0.3–1.2)
Total Protein: 7 g/dL (ref 6.5–8.1)

## 2020-10-12 LAB — I-STAT VENOUS BLOOD GAS, ED
Acid-Base Excess: 0 mmol/L (ref 0.0–2.0)
Bicarbonate: 24.2 mmol/L (ref 20.0–28.0)
Calcium, Ion: 1.16 mmol/L (ref 1.15–1.40)
HCT: 31 % — ABNORMAL LOW (ref 39.0–52.0)
Hemoglobin: 10.5 g/dL — ABNORMAL LOW (ref 13.0–17.0)
O2 Saturation: 100 %
Potassium: 3.5 mmol/L (ref 3.5–5.1)
Sodium: 139 mmol/L (ref 135–145)
TCO2: 25 mmol/L (ref 22–32)
pCO2, Ven: 38.7 mmHg — ABNORMAL LOW (ref 44.0–60.0)
pH, Ven: 7.404 (ref 7.250–7.430)
pO2, Ven: 170 mmHg — ABNORMAL HIGH (ref 32.0–45.0)

## 2020-10-12 LAB — PROTIME-INR
INR: 1 (ref 0.8–1.2)
Prothrombin Time: 13.1 seconds (ref 11.4–15.2)

## 2020-10-12 LAB — LACTIC ACID, PLASMA: Lactic Acid, Venous: 2.1 mmol/L (ref 0.5–1.9)

## 2020-10-12 LAB — CBG MONITORING, ED: Glucose-Capillary: 88 mg/dL (ref 70–99)

## 2020-10-12 LAB — CK: Total CK: 365 U/L (ref 49–397)

## 2020-10-12 MED ORDER — LACTATED RINGERS IV BOLUS
1000.0000 mL | Freq: Once | INTRAVENOUS | Status: AC
  Administered 2020-10-12: 1000 mL via INTRAVENOUS

## 2020-10-12 MED ORDER — VANCOMYCIN HCL 1250 MG/250ML IV SOLN
1250.0000 mg | Freq: Once | INTRAVENOUS | Status: AC
  Administered 2020-10-12: 1250 mg via INTRAVENOUS
  Filled 2020-10-12: qty 250

## 2020-10-12 MED ORDER — CEPHALEXIN 500 MG PO CAPS: 500.0000 mg | ORAL_CAPSULE | Freq: Two times a day (BID) | ORAL | 0 refills | Status: AC

## 2020-10-12 NOTE — ED Notes (Signed)
Pt offered a [place] sandwich and a diet coca cola. Pt is eating a [place] sandwich.

## 2020-10-12 NOTE — ED Notes (Signed)
[facility]ner notified of pts lactic of 2.1

## 2020-10-12 NOTE — Discharge Instructions (Addendum)
Take antibiotic for possible infection to your skin on your legs.  Please be careful when taking your sedating medications at home.  Please follow-up with your primary care doctor to reevaluate your home medications.

## 2020-10-12 NOTE — ED Notes (Signed)
[facility] called to arrange transpo home, spoke with Irving Burton,

## 2020-10-12 NOTE — ED Notes (Signed)
800 cc clear dark yellow urine emptied for external cath, external cath removed, no abnormality at site

## 2020-10-12 NOTE — ED Triage Notes (Addendum)
Pt arrived via ems as a respiratory distress. EMS reports pt was normally axox4 and they were called in for failure thrive. Pt has not been eating according to family and barricading himself in his room. EMS has not gave narcan to pt. Pt has chronic pain and takes oxy on a daily basis but has been out of it. EMS reports the only medicine they found in the pts room was gabapentin.

## 2020-10-12 NOTE — ED Provider Notes (Addendum)
Patient signed out to me at 7 AM.  History of incomplete quadriplegia, chronic opioid and benzo use.  Showed up altered overnight but has improved.  Suspected polypharmacy.  No concern for infectious process.  He appears to be back at his baseline.  Overnight team has been unable to get in touch with family.  He does not endorse any current pain.  He is asking to leave but it sounds like he is frustrated with his care at home.  Will talk with case management/social work and see if we can provide with any home health services/get collateral information about his home life.  Multiple attempts: Family not been successful.  Anticipate discharge home once can find some more information on his home life.  Case management was able to get in touch with family.  Safety plan is in place.  Will discharge back home.  Written for antibiotics for cellulitis of legs.  No concern for sepsis.  At his baseline.  Overall suspect polypharmacy.  This chart was dictated using voice recognition software.  Despite best efforts to proofread,  errors can occur which can change the documentation meaning.    Virgina Norfolk, DO 10/12/20 [id number]    Virgina Norfolk, DO 10/12/20 1001

## 2020-10-12 NOTE — Care Management (Signed)
ED RNCM attempted to contact several family members was able to contact Daughter Amy Gassman who reports being out of town, made her aware that patient was being discharged home, he was evaluated in the ED with no significant findings.  Updated EDP and patient can be transported home by [facility].
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
## 2020-10-12 NOTE — ED Notes (Signed)
Pt is refusing rectal temp and in and out cath when stated the importance of completing the task pt stated he does not give a f, and kept shouting no. Md aware

## 2020-10-12 NOTE — Progress Notes (Signed)
CSW left HIPPA-compliant voicemails with three family members listed in the chart and has not successfully reached any family at this time. CSW will continue to follow for discharge supports.

## 2020-10-17 LAB — CULTURE, BLOOD (ROUTINE X 2): Culture: NO GROWTH

## 2021-11-13 IMAGING — CT CT CERVICAL SPINE W/O CM
4 of 8 series · 10 of 33 positions shown, 11 images · non-contrast
Comparison: None.

CLINICAL DATA: Pain following fall.  Altered mental status

EXAM:
CT HEAD WITHOUT CONTRAST
CT CERVICAL SPINE WITHOUT CONTRAST
TECHNIQUE: Multidetector CT imaging of the head and cervical spine was
performed following the standard protocol without intravenous
contrast. Multiplanar CT image reconstructions of the cervical spine
were also generated.

[Series 6: c_spine 2.0 st · axial · 0.32mm/px · z∈[+202,+264]mm · 2 of 94 slices shown, 3 images (1 of 2)]
[im 32/94  soft-tissue]
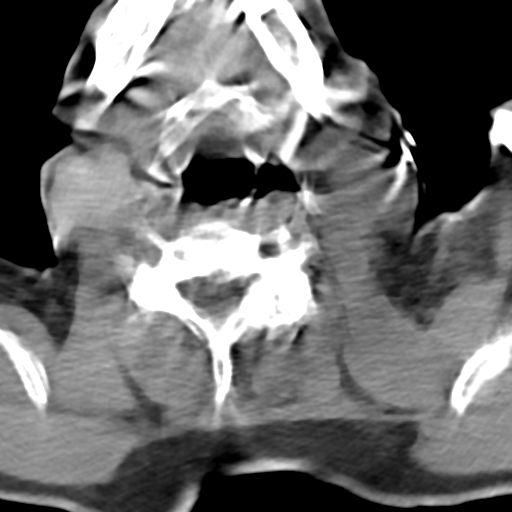
[im 32/94  bone]
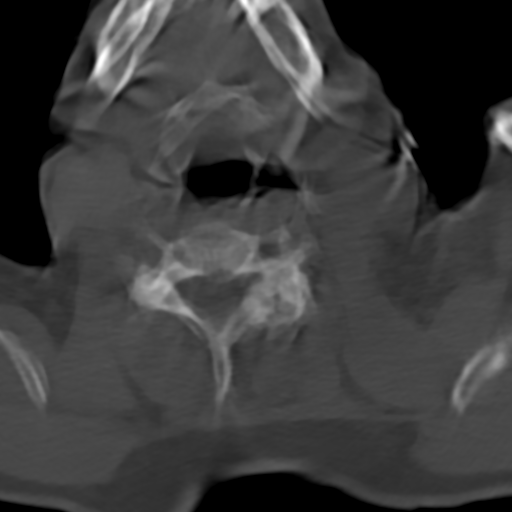
[im 63/94  bone]
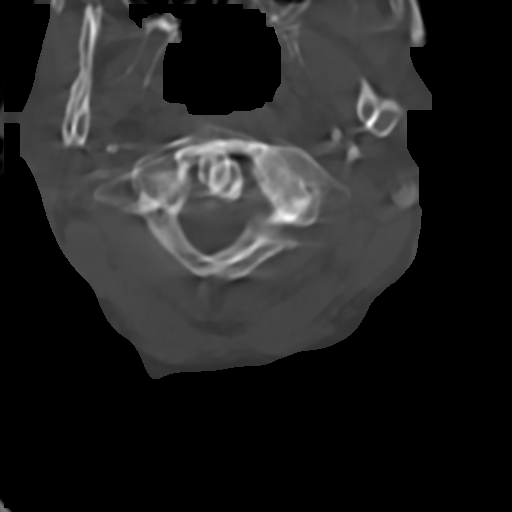

[Series 8: c_spine 2.0 cor bone · coronal · 0.36mm/px · 1 of 57 slices shown]
[im 29/57  bone]
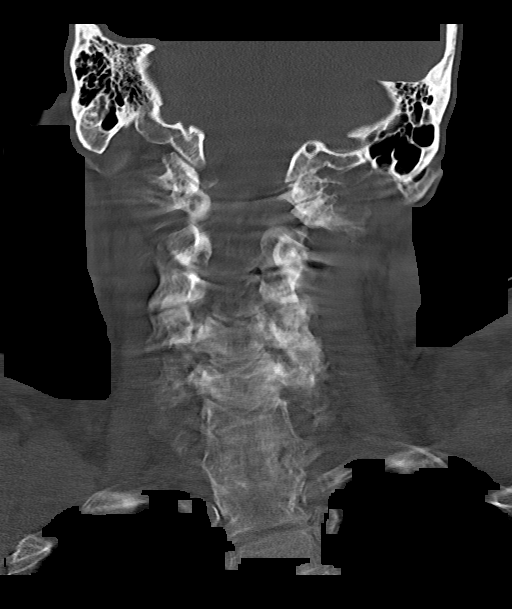

[Series 12: c_spine 2.0 st · axial · 0.32mm/px · z∈[+202,+264]mm · 2 of 94 slices shown (2 of 2)]
[im 32/94  bone]
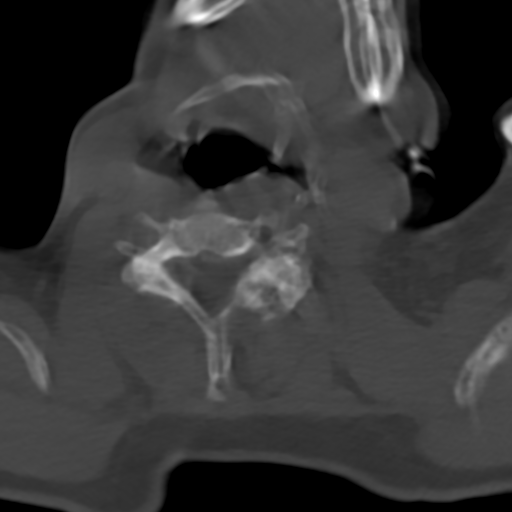
[im 63/94  bone]
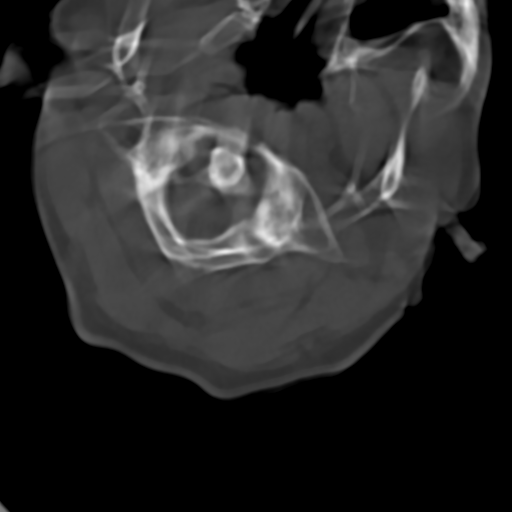

[Series 14: c_spine 2.0 sag bone · sagittal · 0.37mm/px · 5 of 58 slices shown]
[im 10/58  bone]
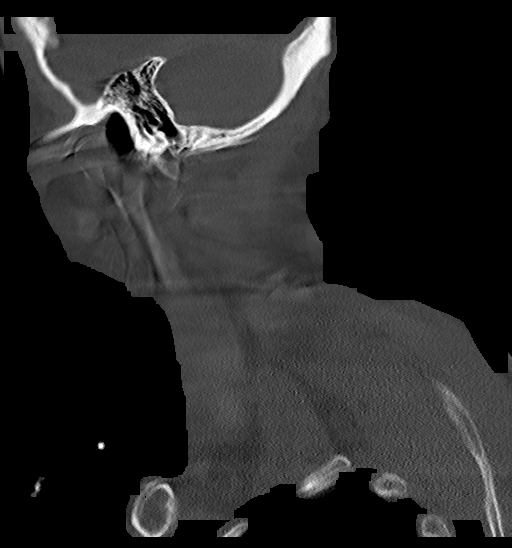
[im 20/58  bone]
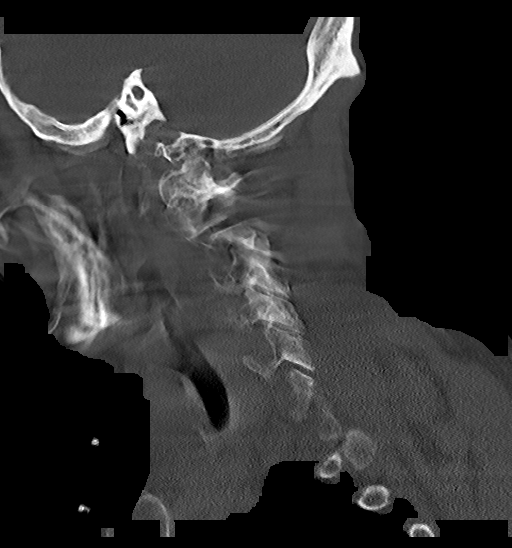
[im 29/58  bone]
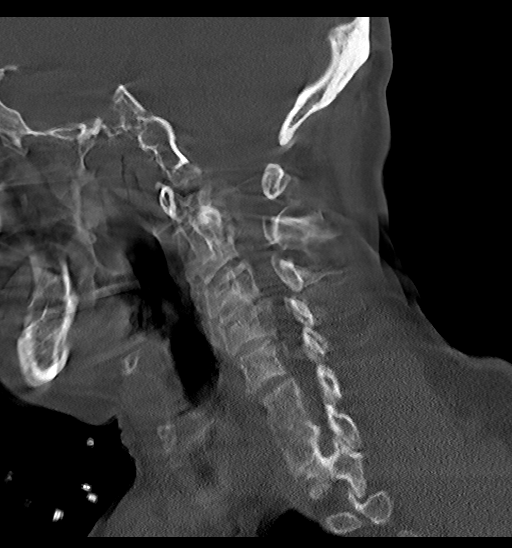
[im 39/58  bone]
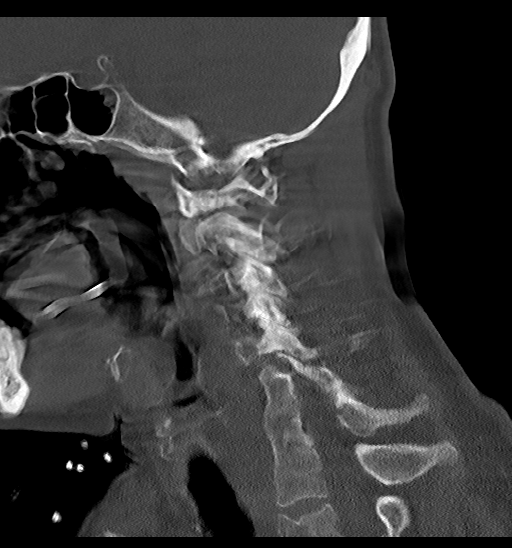
[im 48/58  bone]
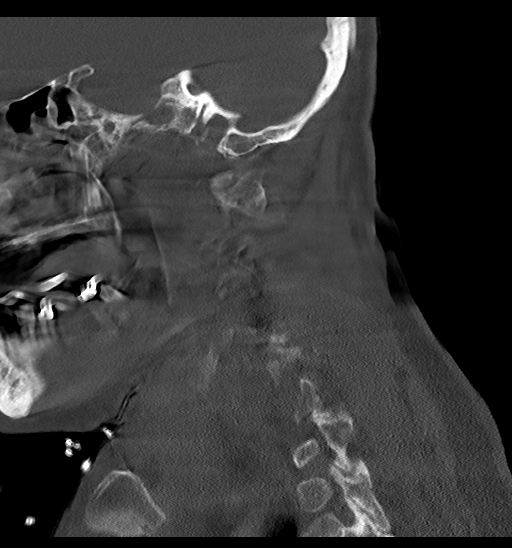

[10 of 33 positions shown; findings below may reference images not displayed]

FINDINGS: CT HEAD FINDINGS

Motion artifact makes this study less than optimal.

Brain: Ventricles and sulci are normal in size and configuration.
There is no intracranial mass, hemorrhage extra-axial fluid
collection, or midline shift. Brain parenchyma appears unremarkable
with note of motion artifact. No acute infarct demonstrable.

Vascular: No hyperdense vessel. No arterial vascular calcification
is demonstrable.

Skull: Bony calvarium appears intact with assessment somewhat
limited due to motion.

Sinuses/Orbits: Opacification noted in several ethmoid air cells and
in the left sphenoid sinus. Visualized orbits appear within normal
limits with limitation from motion.

Other: Mastoid air cells are clear.

CT CERVICAL SPINE FINDINGS

Significant limitations noted due to patient motion.

Alignment: No demonstrable spondylolisthesis. There is upper
thoracic levoscoliosis.

Skull base and vertebrae: The skull base and craniocervical junction
regions appear unremarkable. There is no appreciable fracture. Note
that significant patient motion limits bony assessment with respect
to potential nondisplaced fractures. No blastic or lytic lesions
evident with limitation secondary to motion noted.

Soft tissues and spinal canal: Prevertebral soft tissues and
predental space regions appear within normal limits. No cord or
canal hematoma is appreciable. No paraspinous lesions are evident.

Disc levels: No obvious disc space narrowing noted. Motion artifact
limits assessment of disc spaces. No disc extrusion or stenosis is
appreciable. No significant exit foraminal narrowing is appreciable
with assessment of exit foraminal limited due to patient motion.

Upper chest: Visualized upper lung regions appear grossly normal.

Other: None
IMPRESSION: CT head: Study less than optimal due to a degree of patient motion.
No brain parenchymal lesions are appreciable. No evident acute
infarct. No mass or hemorrhage evident.

There are foci of paranasal sinus disease.

CT cervical spine: Significant motion artifact limits assessment. No
fracture or spondylolisthesis evident with limitations due to
patient motion. No disc extrusion or stenosis evident. No overt
arthropathy.

Comment: If there are clinical symptoms suggesting neurologic
deficit which would suggest potential cervical spine abnormality, it
may be reasonable to immobilize cervical region and repeat study
when patient is able to cooperate.

## 2021-11-13 IMAGING — CT CT HEAD W/O CM
5 of 8 series · 16 of 47 positions shown, 17 images · non-contrast
Comparison: None.

CLINICAL DATA: Pain following fall.  Altered mental status

EXAM:
CT HEAD WITHOUT CONTRAST
CT CERVICAL SPINE WITHOUT CONTRAST
TECHNIQUE: Multidetector CT imaging of the head and cervical spine was
performed following the standard protocol without intravenous
contrast. Multiplanar CT image reconstructions of the cervical spine
were also generated.

[Series 3: head bone · axial · 0.43mm/px · z∈[+292,+390]mm · 7 of 71 slices shown]
[im 8/71  bone]
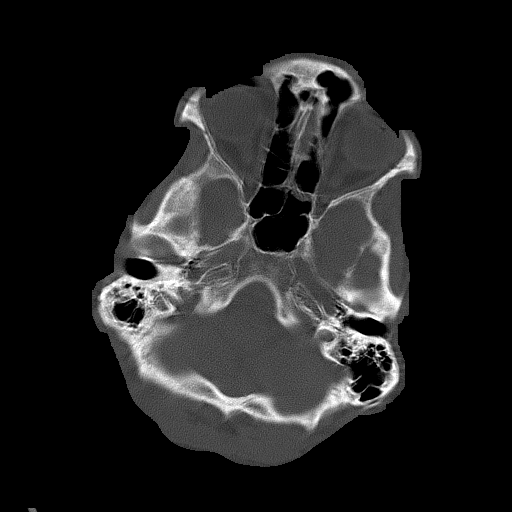
[im 15/71  bone]
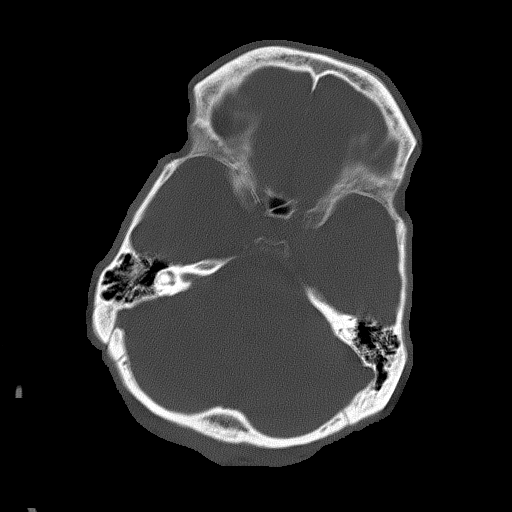
[im 22/71  bone]
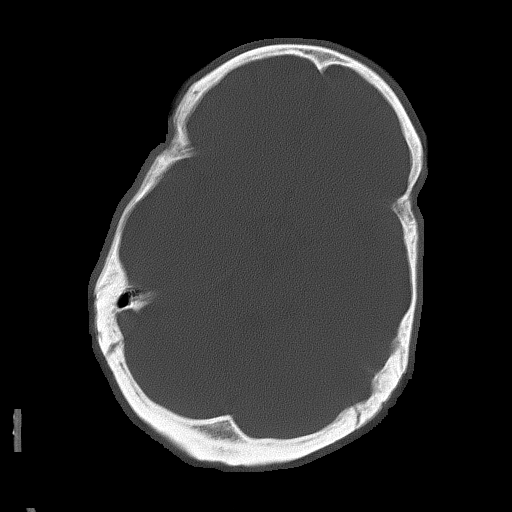
[im 29/71  bone]
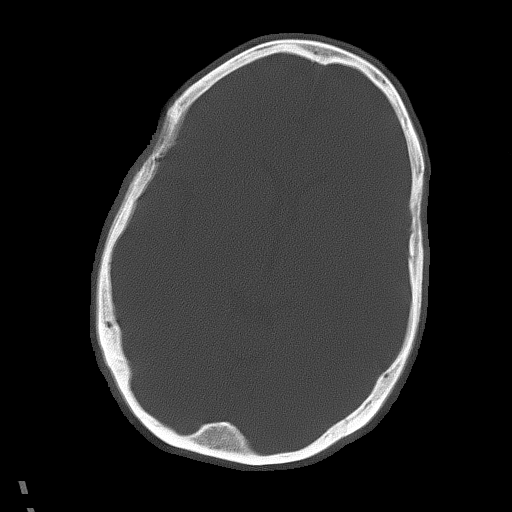
[im 43/71  bone]
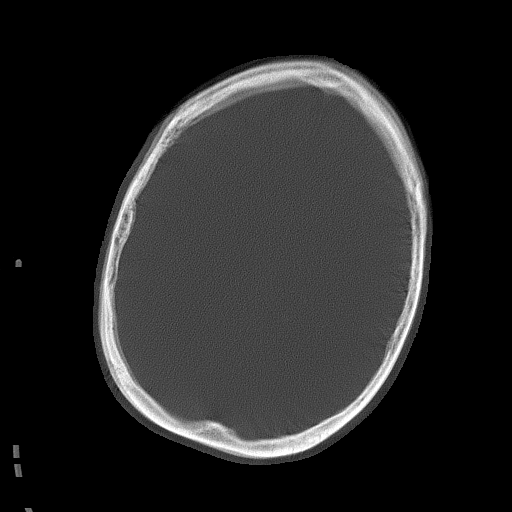
[im 50/71  bone]
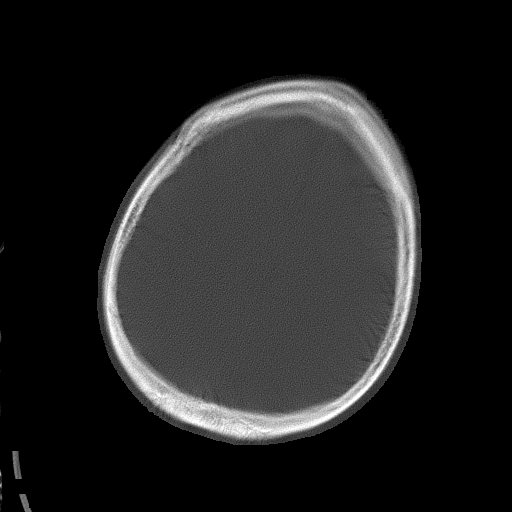
[im 57/71  bone]
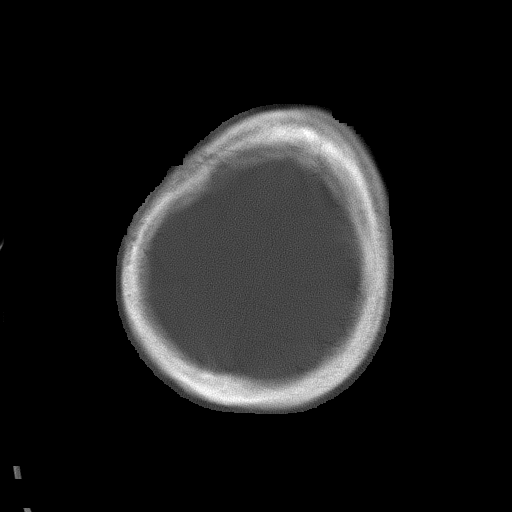

[Series 4: head without · axial · non-contrast · 0.43mm/px · z∈[+323,+368]mm · 2 of 29 slices shown, 3 images (1 of 2)]
[im 10/29  brain]
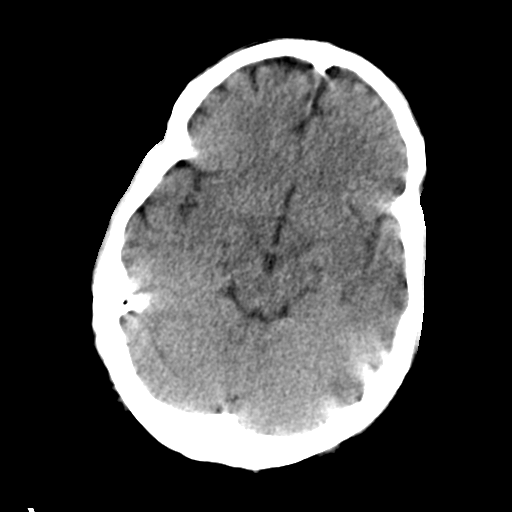
[im 10/29  bone]
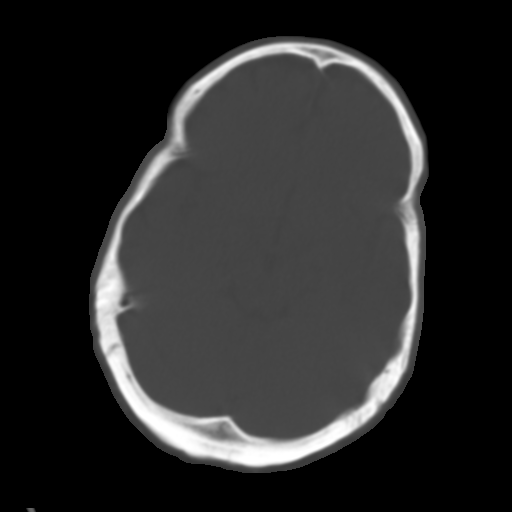
[im 19/29  brain]
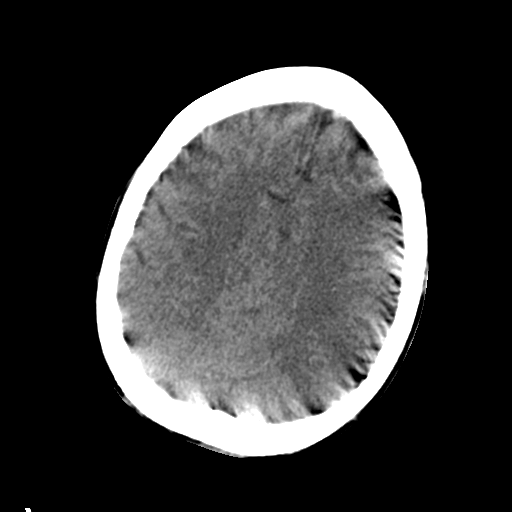

[Series 5: head without cor · coronal · non-contrast · 0.31mm/px · 3 of 68 slices shown]
[im 17/68  brain]
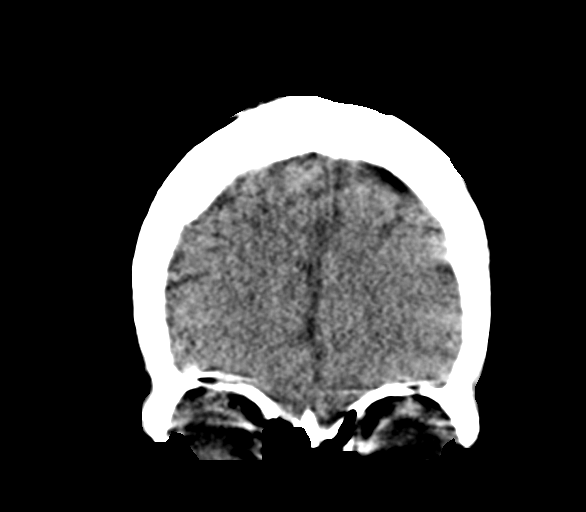
[im 34/68  brain]
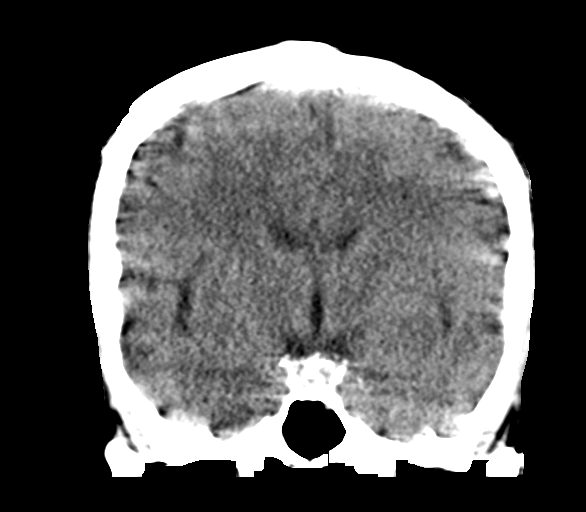
[im 51/68  brain]
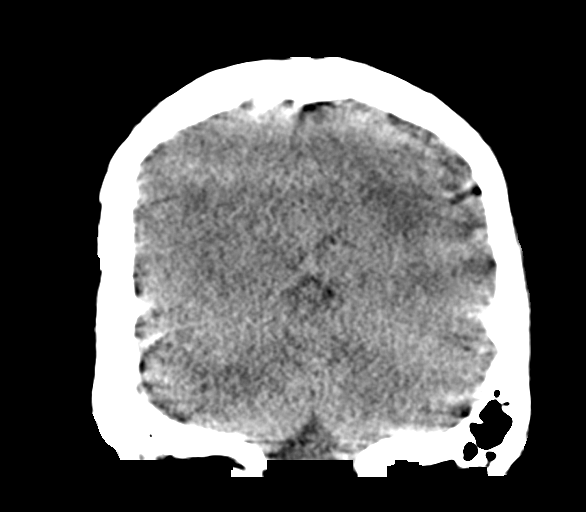

[Series 6: head without sag · sagittal · non-contrast · 0.31mm/px · 2 of 52 slices shown]
[im 18/52  brain]
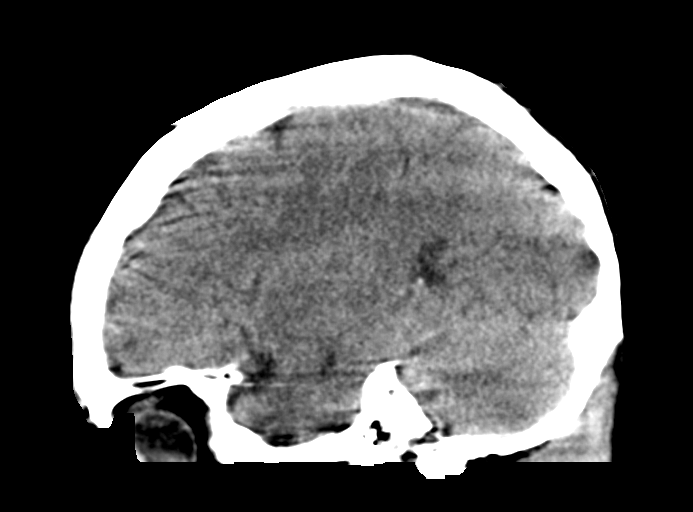
[im 35/52  brain]
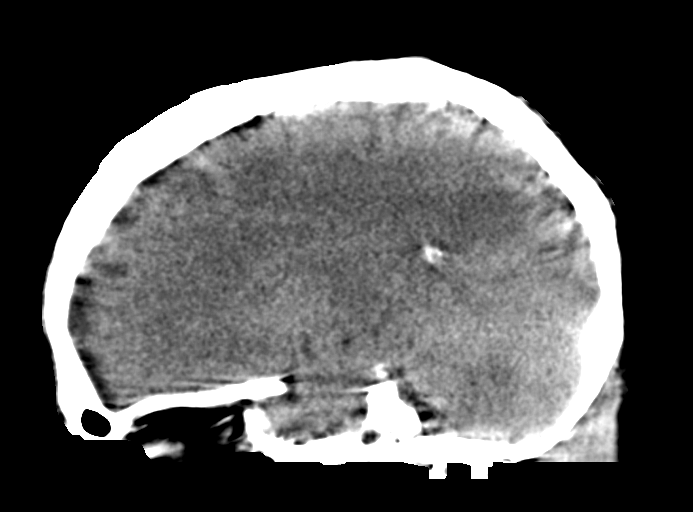

[Series 7: head without · axial · non-contrast · 0.43mm/px · z∈[+323,+368]mm · 2 of 28 slices shown (2 of 2)]
[im 10/28  brain]
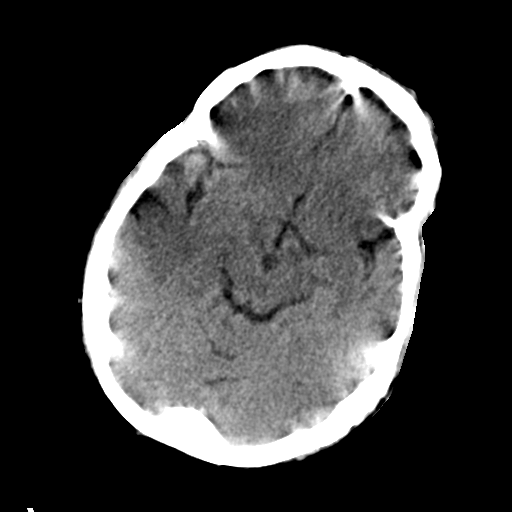
[im 19/28  brain]
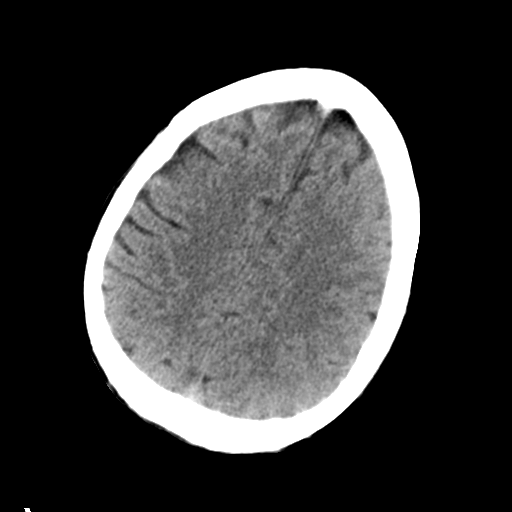

[16 of 47 positions shown; findings below may reference images not displayed]

FINDINGS: CT HEAD FINDINGS

Motion artifact makes this study less than optimal.

Brain: Ventricles and sulci are normal in size and configuration.
There is no intracranial mass, hemorrhage extra-axial fluid
collection, or midline shift. Brain parenchyma appears unremarkable
with note of motion artifact. No acute infarct demonstrable.

Vascular: No hyperdense vessel. No arterial vascular calcification
is demonstrable.

Skull: Bony calvarium appears intact with assessment somewhat
limited due to motion.

Sinuses/Orbits: Opacification noted in several ethmoid air cells and
in the left sphenoid sinus. Visualized orbits appear within normal
limits with limitation from motion.

Other: Mastoid air cells are clear.

CT CERVICAL SPINE FINDINGS

Significant limitations noted due to patient motion.

Alignment: No demonstrable spondylolisthesis. There is upper
thoracic levoscoliosis.

Skull base and vertebrae: The skull base and craniocervical junction
regions appear unremarkable. There is no appreciable fracture. Note
that significant patient motion limits bony assessment with respect
to potential nondisplaced fractures. No blastic or lytic lesions
evident with limitation secondary to motion noted.

Soft tissues and spinal canal: Prevertebral soft tissues and
predental space regions appear within normal limits. No cord or
canal hematoma is appreciable. No paraspinous lesions are evident.

Disc levels: No obvious disc space narrowing noted. Motion artifact
limits assessment of disc spaces. No disc extrusion or stenosis is
appreciable. No significant exit foraminal narrowing is appreciable
with assessment of exit foraminal limited due to patient motion.

Upper chest: Visualized upper lung regions appear grossly normal.

Other: None
IMPRESSION: CT head: Study less than optimal due to a degree of patient motion.
No brain parenchymal lesions are appreciable. No evident acute
infarct. No mass or hemorrhage evident.

There are foci of paranasal sinus disease.

CT cervical spine: Significant motion artifact limits assessment. No
fracture or spondylolisthesis evident with limitations due to
patient motion. No disc extrusion or stenosis evident. No overt
arthropathy.

Comment: If there are clinical symptoms suggesting neurologic
deficit which would suggest potential cervical spine abnormality, it
may be reasonable to immobilize cervical region and repeat study
when patient is able to cooperate.

## 2021-11-17 IMAGING — DX DG CHEST 1V PORT
1 series · 1 of 1 positions shown · non-contrast
Comparison: 05/24/2020

CLINICAL DATA: Respiratory distress

EXAM:
PORTABLE CHEST 1 VIEW

[chest ap]
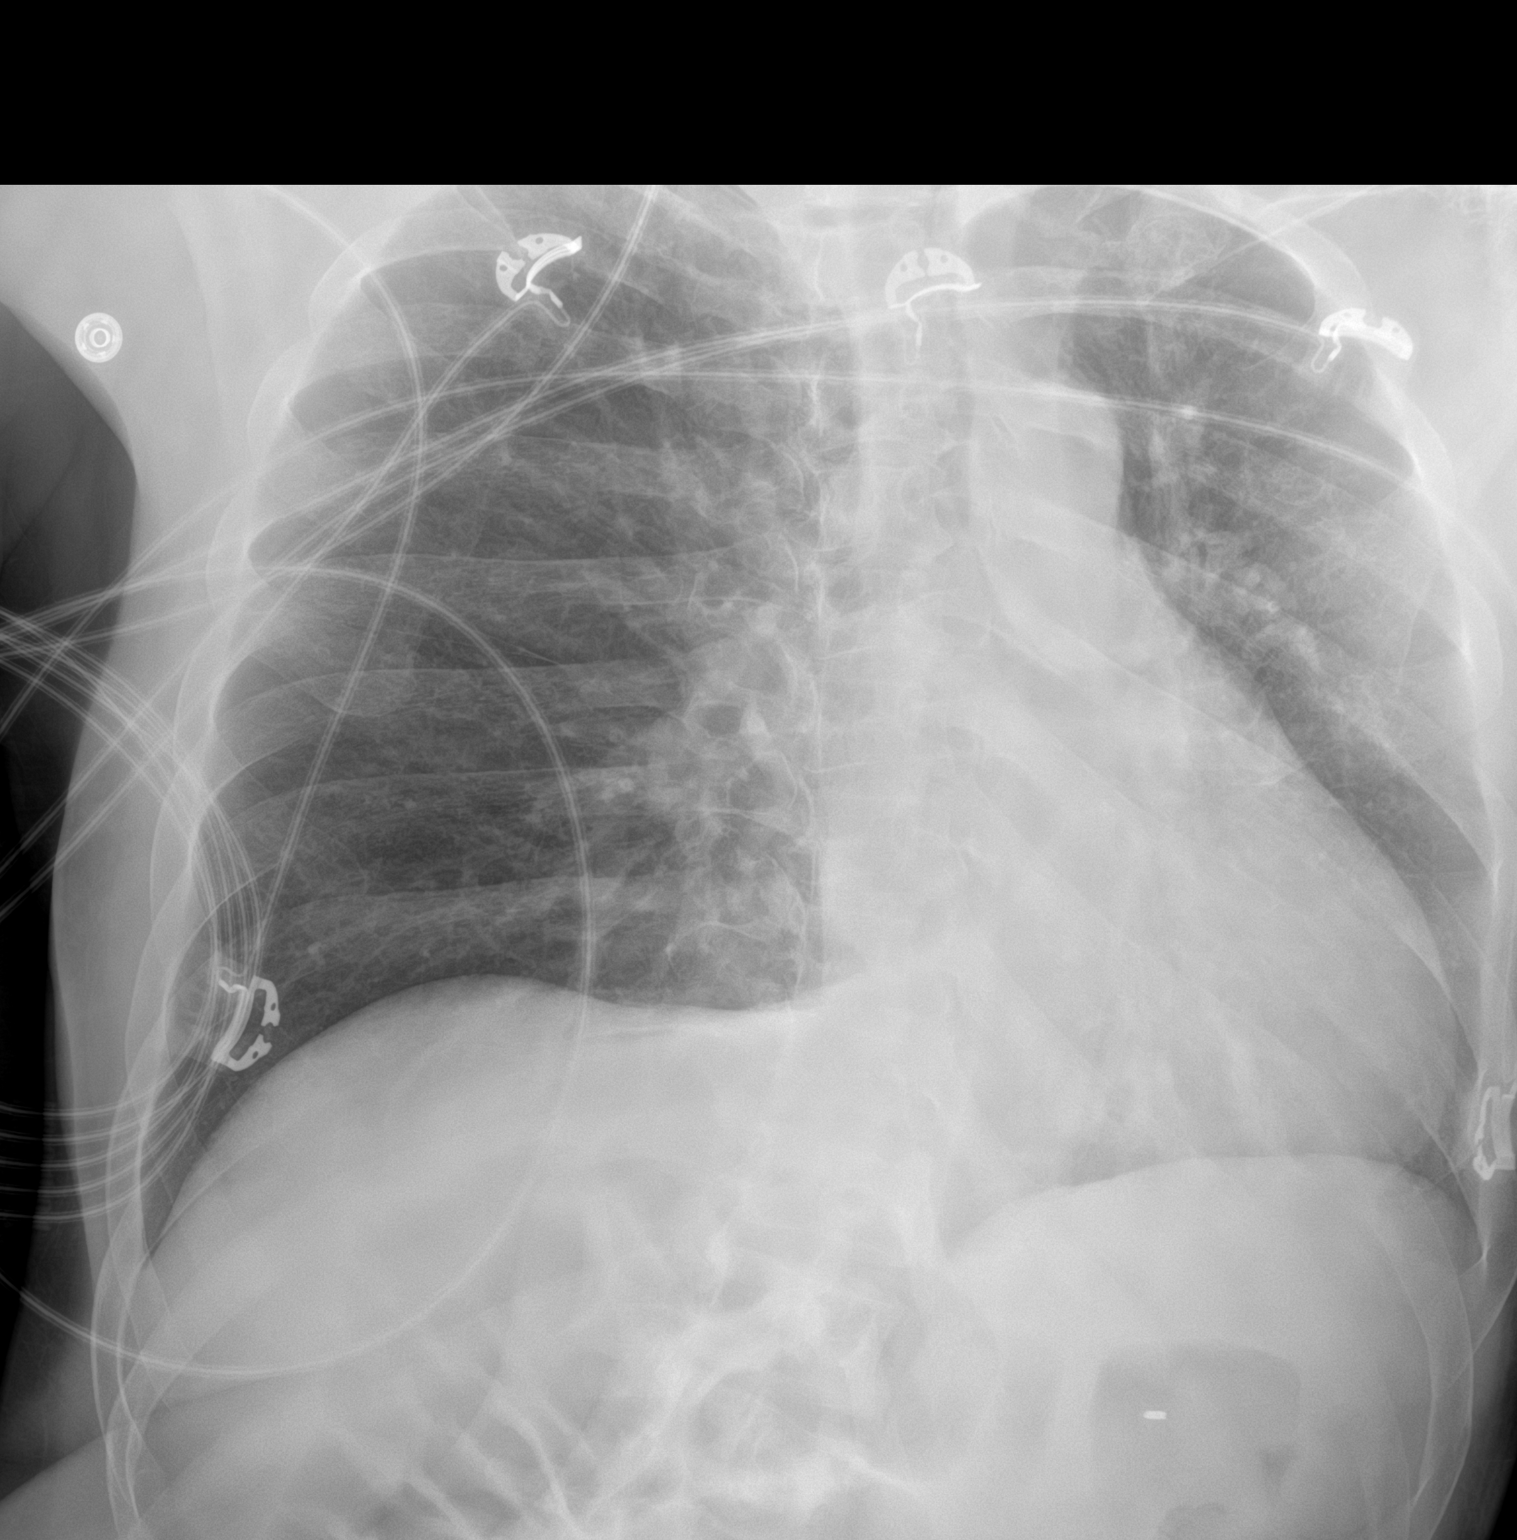

[1 of 1 positions shown; findings below may reference images not displayed]

FINDINGS: The patient is moderately rotated on this examination.

Lungs are clear. No pneumothorax or pleural effusion. Cardiac size
within normal limits. Pulmonary vascularity is normal. No acute bone
abnormality.
IMPRESSION: Limited, but unremarkable examination.

## 2023-02-14 ENCOUNTER — Emergency Department (HOSPITAL_COMMUNITY): Payer: Medicare HMO

## 2023-02-14 ENCOUNTER — Inpatient Hospital Stay (HOSPITAL_COMMUNITY)
Admission: EM | Admit: 2023-02-14 | Discharge: 2023-02-24 | DRG: 917 | Disposition: A | Discharge status: Home / Self Care | Payer: Medicare HMO | Attending: Internal Medicine | Admitting: Internal Medicine

## 2023-02-14 ENCOUNTER — Encounter (HOSPITAL_COMMUNITY): Payer: Self-pay | Admitting: Radiology

## 2023-02-14 DIAGNOSIS — T424X1A Poisoning by benzodiazepines, accidental (unintentional), initial encounter: Secondary | ICD-10-CM | POA: Diagnosis present

## 2023-02-14 DIAGNOSIS — Z5901 Sheltered homelessness: Secondary | ICD-10-CM

## 2023-02-14 DIAGNOSIS — R45851 Suicidal ideations: Secondary | ICD-10-CM | POA: Diagnosis present

## 2023-02-14 DIAGNOSIS — T402X1A Poisoning by other opioids, accidental (unintentional), initial encounter: Principal | ICD-10-CM | POA: Diagnosis present

## 2023-02-14 DIAGNOSIS — Z9081 Acquired absence of spleen: Secondary | ICD-10-CM

## 2023-02-14 DIAGNOSIS — I959 Hypotension, unspecified: Secondary | ICD-10-CM | POA: Diagnosis present

## 2023-02-14 DIAGNOSIS — Z993 Dependence on wheelchair: Secondary | ICD-10-CM

## 2023-02-14 DIAGNOSIS — T50901A Poisoning by unspecified drugs, medicaments and biological substances, accidental (unintentional), initial encounter: Secondary | ICD-10-CM

## 2023-02-14 DIAGNOSIS — K439 Ventral hernia without obstruction or gangrene: Secondary | ICD-10-CM

## 2023-02-14 DIAGNOSIS — R911 Solitary pulmonary nodule: Secondary | ICD-10-CM | POA: Diagnosis present

## 2023-02-14 DIAGNOSIS — G8929 Other chronic pain: Secondary | ICD-10-CM | POA: Diagnosis present

## 2023-02-14 DIAGNOSIS — G8254 Quadriplegia, C5-C7 incomplete: Secondary | ICD-10-CM | POA: Diagnosis present

## 2023-02-14 DIAGNOSIS — T50904A Poisoning by unspecified drugs, medicaments and biological substances, undetermined, initial encounter: Secondary | ICD-10-CM

## 2023-02-14 DIAGNOSIS — Z79891 Long term (current) use of opiate analgesic: Secondary | ICD-10-CM

## 2023-02-14 DIAGNOSIS — T40601A Poisoning by unspecified narcotics, accidental (unintentional), initial encounter: Principal | ICD-10-CM

## 2023-02-14 DIAGNOSIS — F4310 Post-traumatic stress disorder, unspecified: Secondary | ICD-10-CM | POA: Diagnosis present

## 2023-02-14 DIAGNOSIS — Z5900 Homelessness unspecified: Secondary | ICD-10-CM

## 2023-02-14 DIAGNOSIS — T405X1A Poisoning by cocaine, accidental (unintentional), initial encounter: Secondary | ICD-10-CM | POA: Diagnosis present

## 2023-02-14 DIAGNOSIS — Z79899 Other long term (current) drug therapy: Secondary | ICD-10-CM

## 2023-02-14 DIAGNOSIS — J9601 Acute respiratory failure with hypoxia: Secondary | ICD-10-CM | POA: Diagnosis present

## 2023-02-14 DIAGNOSIS — F1910 Other psychoactive substance abuse, uncomplicated: Secondary | ICD-10-CM

## 2023-02-14 DIAGNOSIS — R918 Other nonspecific abnormal finding of lung field: Secondary | ICD-10-CM

## 2023-02-14 DIAGNOSIS — Z791 Long term (current) use of non-steroidal anti-inflammatories (NSAID): Secondary | ICD-10-CM

## 2023-02-14 DIAGNOSIS — R4182 Altered mental status, unspecified: Secondary | ICD-10-CM | POA: Diagnosis not present

## 2023-02-14 LAB — I-STAT CHEM 8, ED
BUN: 12 mg/dL (ref 6–20)
Calcium, Ion: 1.12 mmol/L — ABNORMAL LOW (ref 1.15–1.40)
Chloride: 104 mmol/L (ref 98–111)
Creatinine, Ser: 0.9 mg/dL (ref 0.61–1.24)
Glucose, Bld: 87 mg/dL (ref 70–99)
HCT: 43 % (ref 39.0–52.0)
Hemoglobin: 14.6 g/dL (ref 13.0–17.0)
Potassium: 3.8 mmol/L (ref 3.5–5.1)
Sodium: 142 mmol/L (ref 135–145)
TCO2: 23 mmol/L (ref 22–32)

## 2023-02-14 LAB — AMMONIA: Ammonia: 35 umol/L (ref 9–35)

## 2023-02-14 LAB — LACTIC ACID, PLASMA
Lactic Acid, Venous: 1.6 mmol/L (ref 0.5–1.9)
Lactic Acid, Venous: 1.3 mmol/L (ref 0.5–1.9)

## 2023-02-14 LAB — CBC WITH DIFFERENTIAL/PLATELET
Abs Immature Granulocytes: 0.04 10*3/uL (ref 0.00–0.07)
Basophils Absolute: 0.1 10*3/uL (ref 0.0–0.1)
Basophils Relative: 1 %
Eosinophils Absolute: 0.3 10*3/uL (ref 0.0–0.5)
Eosinophils Relative: 4 %
HCT: 44.3 % (ref 39.0–52.0)
Hemoglobin: 14.8 g/dL (ref 13.0–17.0)
Immature Granulocytes: 0 %
Lymphocytes Relative: 22 %
Lymphs Abs: 2 10*3/uL (ref 0.7–4.0)
MCH: 27.7 pg (ref 26.0–34.0)
MCHC: 33.4 g/dL (ref 30.0–36.0)
MCV: 83 fL (ref 80.0–100.0)
Monocytes Absolute: 1 10*3/uL (ref 0.1–1.0)
Monocytes Relative: 11 %
Neutro Abs: 5.6 10*3/uL (ref 1.7–7.7)
Neutrophils Relative %: 62 %
Platelets: 509 10*3/uL — ABNORMAL HIGH (ref 150–400)
RBC: 5.34 MIL/uL (ref 4.22–5.81)
RDW: 13.6 % (ref 11.5–15.5)
WBC: 8.9 10*3/uL (ref 4.0–10.5)
nRBC: 0 % (ref 0.0–0.2)

## 2023-02-14 LAB — URINALYSIS, ROUTINE W REFLEX MICROSCOPIC
Glucose, UA: NEGATIVE mg/dL
Ketones, ur: 5 mg/dL — AB
Nitrite: NEGATIVE
Protein, ur: 30 mg/dL — AB
Specific Gravity, Urine: 1.019 (ref 1.005–1.030)
WBC, UA: >50 WBC/hpf (ref 0–5)
pH: 6 (ref 5.0–8.0)

## 2023-02-14 LAB — BASIC METABOLIC PANEL
Anion gap: 12 (ref 5–15)
BUN: 13 mg/dL (ref 6–20)
CO2: 22 mmol/L (ref 22–32)
Calcium: 8.9 mg/dL (ref 8.9–10.3)
Chloride: 104 mmol/L (ref 98–111)
Creatinine, Ser: 0.86 mg/dL (ref 0.61–1.24)
GFR, Estimated: >60 mL/min (ref >60)
Glucose, Bld: 90 mg/dL (ref 70–99)
Potassium: 3.7 mmol/L (ref 3.5–5.1)
Sodium: 138 mmol/L (ref 135–145)

## 2023-02-14 LAB — HEPATIC FUNCTION PANEL
ALT: 90 U/L — ABNORMAL HIGH (ref 0–44)
AST: 144 U/L — ABNORMAL HIGH (ref 15–41)
Albumin: 3.4 g/dL — ABNORMAL LOW (ref 3.5–5.0)
Alkaline Phosphatase: 94 U/L (ref 38–126)
Bilirubin, Direct: 0.5 mg/dL — ABNORMAL HIGH (ref 0.0–0.2)
Indirect Bilirubin: 0.6 mg/dL (ref 0.3–0.9)
Total Bilirubin: 1.1 mg/dL (ref 0.3–1.2)
Total Protein: 8.6 g/dL — ABNORMAL HIGH (ref 6.5–8.1)

## 2023-02-14 LAB — RAPID URINE DRUG SCREEN, HOSP PERFORMED
Amphetamines: NOT DETECTED
Barbiturates: NOT DETECTED
Benzodiazepines: POSITIVE — AB
Cocaine: POSITIVE — AB
Opiates: POSITIVE — AB
Tetrahydrocannabinol: NOT DETECTED

## 2023-02-14 LAB — ACETAMINOPHEN LEVEL: Acetaminophen (Tylenol), Serum: <10 ug/mL — ABNORMAL LOW (ref 10–30)

## 2023-02-14 LAB — BLOOD GAS, VENOUS
Acid-Base Excess: 0 mmol/L (ref 0.0–2.0)
Bicarbonate: 24.8 mmol/L (ref 20.0–28.0)
O2 Saturation: 87.7 %
Patient temperature: 37
pCO2, Ven: 40 mmHg — ABNORMAL LOW (ref 44–60)
pH, Ven: 7.4 (ref 7.25–7.43)
pO2, Ven: 56 mmHg — ABNORMAL HIGH (ref 32–45)

## 2023-02-14 LAB — HIV ANTIBODY (ROUTINE TESTING W REFLEX): HIV Screen 4th Generation wRfx: NONREACTIVE

## 2023-02-14 LAB — MAGNESIUM: Magnesium: 2.2 mg/dL (ref 1.7–2.4)

## 2023-02-14 LAB — TROPONIN I (HIGH SENSITIVITY)
Troponin I (High Sensitivity): 5 ng/L (ref <18)
Troponin I (High Sensitivity): 6 ng/L (ref <18)

## 2023-02-14 LAB — CBG MONITORING, ED: Glucose-Capillary: 99 mg/dL (ref 70–99)

## 2023-02-14 LAB — SALICYLATE LEVEL: Salicylate Lvl: <7 mg/dL — ABNORMAL LOW (ref 7.0–30.0)

## 2023-02-14 LAB — ETHANOL: Alcohol, Ethyl (B): <10 mg/dL (ref <10)

## 2023-02-14 MED ORDER — ENOXAPARIN SODIUM 40 MG/0.4ML IJ SOSY
40.0000 mg | PREFILLED_SYRINGE | INTRAMUSCULAR | Status: DC
  Administered 2023-02-14 – 2023-02-23 (×10): 40 mg via SUBCUTANEOUS
  Filled 2023-02-14 (×10): qty 0.4

## 2023-02-14 MED ORDER — ACETAMINOPHEN 325 MG PO TABS
650.0000 mg | ORAL_TABLET | Freq: Four times a day (QID) | ORAL | Status: DC | PRN
  Administered 2023-02-15 – 2023-02-24 (×6): 650 mg via ORAL
  Filled 2023-02-14 (×6): qty 2

## 2023-02-14 MED ORDER — NALOXONE HCL 0.4 MG/ML IJ SOLN: 0.4000 mg | INTRAMUSCULAR | Status: DC | PRN

## 2023-02-14 MED ORDER — ONDANSETRON HCL 4 MG/2ML IJ SOLN: 4.0000 mg | Freq: Four times a day (QID) | INTRAMUSCULAR | Status: DC | PRN

## 2023-02-14 MED ORDER — OXYCODONE HCL 5 MG PO TABS
10.0000 mg | ORAL_TABLET | Freq: Once | ORAL | Status: AC
  Administered 2023-02-14: 10 mg via ORAL
  Filled 2023-02-14: qty 2

## 2023-02-14 MED ORDER — SODIUM CHLORIDE 0.9 % IV SOLN: INTRAVENOUS | Status: DC

## 2023-02-14 MED ORDER — TRAZODONE HCL 50 MG PO TABS
25.0000 mg | ORAL_TABLET | Freq: Every evening | ORAL | Status: DC | PRN
  Administered 2023-02-15 – 2023-02-24 (×5): 25 mg via ORAL
  Filled 2023-02-14 (×5): qty 1

## 2023-02-14 MED ORDER — OXYCODONE HCL 5 MG PO TABS
10.0000 mg | ORAL_TABLET | Freq: Four times a day (QID) | ORAL | Status: DC | PRN
  Administered 2023-02-15 – 2023-02-19 (×17): 10 mg via ORAL
  Filled 2023-02-14 (×18): qty 2

## 2023-02-14 MED ORDER — LACTATED RINGERS IV BOLUS
1000.0000 mL | Freq: Once | INTRAVENOUS | Status: AC
  Administered 2023-02-14: 1000 mL via INTRAVENOUS

## 2023-02-14 MED ORDER — ACETAMINOPHEN 650 MG RE SUPP: 650.0000 mg | Freq: Four times a day (QID) | RECTAL | Status: DC | PRN

## 2023-02-14 MED ORDER — ONDANSETRON HCL 4 MG PO TABS: 4.0000 mg | ORAL_TABLET | Freq: Four times a day (QID) | ORAL | Status: DC | PRN
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
## 2023-02-14 NOTE — ED Notes (Signed)
Md at bedside, reevaluating PT, also explain to Pt possible admission, PT verbalized understanding to plan of care

## 2023-02-14 NOTE — ED Notes (Signed)
Pt given sandwich and something to drink.
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
## 2023-02-14 NOTE — ED Notes (Signed)
Pt. Said they were unable to provide urine specimen.

## 2023-02-14 NOTE — ED Provider Notes (Signed)
[place] EMERGENCY DEPARTMENT AT [facility] Provider Note   CSN: [id number] Arrival date & time: 02/14/23  2952     History  Chief Complaint  Patient presents with   Drug Overdose    Pt bib EMS for overdose from the [facility]. Found unresponsive, pulse present not breathing. 2 mg narcan given. Pt denies use of any drugs.   EMS vitals BP 112/80 HR 90 SPO2 96% CBG 141    Mark Porter is a 57 y.o. male.   Drug Overdose Associated symptoms include abdominal pain.  Presents for unresponsiveness.  He was brought in by EMS.  He [place] and unresponsive and apneic.  He began consciousness after 2 mg of intranasal Narcan.  On arrival in the ED, patient endorses pain everywhere.  He states that his abdominal hernia is chronic but has worsened.  He states that his drug of choice is crack cocaine.  He is amnestic to events that led up to his arrival in the ED.     Home Medications Prior to Admission medications   Medication Sig Start Date End Date Taking? Authorizing Provider  baclofen (LIORESAL) 10 MG tablet Take 10 mg by mouth 3 (three) times daily. 05/13/20   [provider]  diazepam (VALIUM) 5 MG tablet Take 5 mg by mouth 3 (three) times daily. 11/30/18   [provider]  gabapentin (NEURONTIN) 300 MG capsule Take 1 capsule (300 mg total) by mouth 3 (three) times daily. 06/05/20   Pahwani, Kasandra Knudsen, MD  meloxicam (MOBIC) 15 MG tablet Take 15 mg by mouth daily. 05/13/20   [provider]  Multiple Vitamin (MULTIVITAMIN WITH MINERALS) TABS tablet Take 1 tablet by mouth daily. 06/06/20   Pahwani, Kasandra Knudsen, MD  NARCAN 4 MG/0.1ML LIQD nasal spray kit Place 4 mg into the nose once as needed. 04/14/20   [provider]  oxyCODONE (ROXICODONE) 15 MG immediate release tablet Take 15 mg by mouth 4 (four) times daily. 11/16/18   [provider]  oxyCODONE (ROXICODONE) 15 MG immediate release tablet Take 15 mg by mouth 4  (four) times daily as needed. 03/14/20   [provider]  oxyCODONE-acetaminophen (PERCOCET) 10-325 MG tablet Take 1 tablet by mouth 4 (four) times daily as needed. 05/13/20   [provider]  QUEtiapine (SEROQUEL) 50 MG tablet Take 1 tablet (50 mg total) by mouth 2 (two) times daily. 06/05/20   Pahwani, Kasandra Knudsen, MD  thiamine 100 MG tablet Take 1 tablet (100 mg total) by mouth daily. 06/06/20   Pahwani, Kasandra Knudsen, MD      Allergies    Patient has no known allergies.    Review of Systems   Review of Systems  Constitutional:  Positive for fatigue.  Gastrointestinal:  Positive for abdominal distention and abdominal pain.  Musculoskeletal:  Positive for arthralgias and myalgias.  Psychiatric/Behavioral:  Positive for confusion.   All other systems reviewed and are negative.   Physical Exam Updated Vital Signs BP 95/69 (BP Location: Left Arm)   Pulse 86   Resp 16   Ht 5\' 7"  (1.702 m)   Wt 86.2 kg   SpO2 96%   BMI 29.76 kg/m  Physical Exam Vitals and nursing note reviewed.  Constitutional:      General: He is not in acute distress.    Appearance: Normal appearance. He is well-developed. He is ill-appearing. He is not toxic-appearing or diaphoretic.  HENT:     Head:  Normocephalic and atraumatic.     Right Ear: External ear normal.     Left Ear: External ear normal.     Nose: Nose normal.     Mouth/Throat:     Mouth: Mucous membranes are moist.  Eyes:     Extraocular Movements: Extraocular movements intact.     Conjunctiva/sclera: Conjunctivae normal.  Cardiovascular:     Rate and Rhythm: Normal rate and regular rhythm.     Heart sounds: No murmur heard. Pulmonary:     Effort: Pulmonary effort is normal. No respiratory distress.     Breath sounds: Normal breath sounds. No wheezing or rales.  Chest:     Chest wall: No tenderness.  Abdominal:     General: There is distension.     Palpations: Abdomen is soft.     Tenderness: There is abdominal tenderness.      Hernia: A hernia is present.  Musculoskeletal:        General: No swelling. Normal range of motion.     Cervical back: Normal range of motion and neck supple.  Skin:    General: Skin is warm and dry.     Coloration: Skin is not jaundiced or pale.  Neurological:     General: No focal deficit present.     Mental Status: He is alert and oriented to person, place, and time.  Psychiatric:        Mood and Affect: Mood normal.        Behavior: Behavior normal.     ED Results / Procedures / Treatments   Labs (all labs ordered are listed, but only abnormal results are displayed) Labs Reviewed  SALICYLATE LEVEL  ACETAMINOPHEN LEVEL  ETHANOL  RAPID URINE DRUG SCREEN, HOSP PERFORMED  CBC WITH DIFFERENTIAL/PLATELET  BASIC METABOLIC PANEL  HEPATIC FUNCTION PANEL  URINALYSIS, ROUTINE W REFLEX MICROSCOPIC  BLOOD GAS, VENOUS  MAGNESIUM  AMMONIA  LACTIC ACID, PLASMA  LACTIC ACID, PLASMA  CBG MONITORING, ED  I-STAT CHEM 8, ED  TROPONIN I (HIGH SENSITIVITY)    EKG EKG Interpretation Date/Time:  Monday February 14 2023 06:33:26 EDT Ventricular Rate:  91 PR Interval:  156 QRS Duration:  86 QT Interval:  451 QTC Calculation: 552 R Axis:   146  Text Interpretation: Sinus rhythm Right axis deviation Low voltage, extremity and precordial leads Prolonged QT interval Confirmed by Gloris Manchester ([id number]) on 02/14/2023 7:01:24 AM  Radiology No results found.  Procedures Procedures    Medications Ordered in ED Medications  naloxone (NARCAN) injection 0.4 mg (has no administration in time range)  lactated ringers bolus 1,000 mL (has no administration in time range)    ED Course/ Medical Decision Making/ A&P                                 Medical Decision Making Amount and/or Complexity of Data Reviewed Labs: ordered. Radiology: ordered.  Risk Prescription drug management.   This patient presents to the ED for concern of unresponsiveness, this involves an extensive number of  treatment options, and is a complaint that carries with it a high risk of complications and morbidity.  The differential diagnosis includes opiate overdose, other overdose, self-harm, trauma, anoxic brain injury, metabolic derangements   Co morbidities that complicate the patient evaluation  Chronic opiate and benzodiazepine use   Additional history obtained:  Additional history obtained from EMS External records from outside source obtained and reviewed including EMR  Lab Tests:  I Ordered, and personally interpreted labs.  The pertinent results include: Results pending at time of signout   Imaging Studies ordered:  I ordered imaging studies including CT of head, chest, abdomen, pelvis I independently visualized and interpreted imaging which showed (pending at time of signout) I agree with the radiologist interpretation   Cardiac Monitoring: / EKG:  The patient was maintained on a cardiac monitor.  I personally viewed and interpreted the cardiac monitored which showed an underlying rhythm of: Sinus rhythm  Problem List / ED Course / Critical interventions / Medication management  Patient presenting for unresponsiveness.  He regained consciousness after 2 mg of Narcan.  He states that his drug of choice is crack cocaine but he is unaware of what events led to him coming to the ED.  Vital signs on arrival are notable for soft blood pressure.  He was subsequently hypoxic with SpO2 in the range of 90 on room air.  He was placed on 2 L of supplemental oxygen.  Given very limited history, broad workup was initiated.  Care of patient signed out to oncoming ED provider. I ordered medication including IV fluids for hydration Reevaluation of the patient after these medicines showed that the patient improved I have reviewed the patients home medicines and have made adjustments as needed   Social Determinants of Health:  Has PCP        Final Clinical Impression(s) / ED  Diagnoses Final diagnoses:  Opiate overdose, accidental or unintentional, initial encounter [facility])    Rx / DC Orders ED Discharge Orders     None         Gloris Manchester, MD 02/14/23 [phone]

## 2023-02-14 NOTE — ED Notes (Signed)
Urinal at bedside.  

## 2023-02-14 NOTE — ED Notes (Signed)
Pt given a sandwich

## 2023-02-14 NOTE — ED Notes (Signed)
Pt given something to drink per RN Grover Canavan request and  fulfilling pts request.
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
## 2023-02-15 ENCOUNTER — Encounter (HOSPITAL_COMMUNITY): Payer: Self-pay | Admitting: Internal Medicine

## 2023-02-15 ENCOUNTER — Other Ambulatory Visit: Payer: Self-pay

## 2023-02-15 DIAGNOSIS — R911 Solitary pulmonary nodule: Secondary | ICD-10-CM | POA: Diagnosis present

## 2023-02-15 DIAGNOSIS — K439 Ventral hernia without obstruction or gangrene: Secondary | ICD-10-CM | POA: Diagnosis not present

## 2023-02-15 DIAGNOSIS — R4182 Altered mental status, unspecified: Secondary | ICD-10-CM | POA: Diagnosis present

## 2023-02-15 DIAGNOSIS — I950 Idiopathic hypotension: Secondary | ICD-10-CM | POA: Diagnosis not present

## 2023-02-15 DIAGNOSIS — T402X1A Poisoning by other opioids, accidental (unintentional), initial encounter: Secondary | ICD-10-CM | POA: Diagnosis present

## 2023-02-15 DIAGNOSIS — Z5901 Sheltered homelessness: Secondary | ICD-10-CM | POA: Diagnosis not present

## 2023-02-15 DIAGNOSIS — T424X1A Poisoning by benzodiazepines, accidental (unintentional), initial encounter: Secondary | ICD-10-CM | POA: Diagnosis present

## 2023-02-15 DIAGNOSIS — G903 Multi-system degeneration of the autonomic nervous system: Secondary | ICD-10-CM | POA: Diagnosis not present

## 2023-02-15 DIAGNOSIS — G8254 Quadriplegia, C5-C7 incomplete: Secondary | ICD-10-CM | POA: Diagnosis present

## 2023-02-15 DIAGNOSIS — T405X1A Poisoning by cocaine, accidental (unintentional), initial encounter: Secondary | ICD-10-CM | POA: Diagnosis present

## 2023-02-15 DIAGNOSIS — Z9081 Acquired absence of spleen: Secondary | ICD-10-CM | POA: Diagnosis not present

## 2023-02-15 DIAGNOSIS — Z791 Long term (current) use of non-steroidal anti-inflammatories (NSAID): Secondary | ICD-10-CM | POA: Diagnosis not present

## 2023-02-15 DIAGNOSIS — I952 Hypotension due to drugs: Secondary | ICD-10-CM | POA: Diagnosis not present

## 2023-02-15 DIAGNOSIS — J9601 Acute respiratory failure with hypoxia: Secondary | ICD-10-CM | POA: Diagnosis present

## 2023-02-15 DIAGNOSIS — Z993 Dependence on wheelchair: Secondary | ICD-10-CM | POA: Diagnosis not present

## 2023-02-15 DIAGNOSIS — F4310 Post-traumatic stress disorder, unspecified: Secondary | ICD-10-CM | POA: Diagnosis present

## 2023-02-15 DIAGNOSIS — Z79891 Long term (current) use of opiate analgesic: Secondary | ICD-10-CM | POA: Diagnosis not present

## 2023-02-15 DIAGNOSIS — T40601A Poisoning by unspecified narcotics, accidental (unintentional), initial encounter: Secondary | ICD-10-CM | POA: Diagnosis not present

## 2023-02-15 DIAGNOSIS — I959 Hypotension, unspecified: Secondary | ICD-10-CM | POA: Diagnosis present

## 2023-02-15 DIAGNOSIS — G8929 Other chronic pain: Secondary | ICD-10-CM | POA: Diagnosis present

## 2023-02-15 DIAGNOSIS — Z79899 Other long term (current) drug therapy: Secondary | ICD-10-CM | POA: Diagnosis not present

## 2023-02-15 DIAGNOSIS — R45851 Suicidal ideations: Secondary | ICD-10-CM | POA: Diagnosis present

## 2023-02-15 DIAGNOSIS — R918 Other nonspecific abnormal finding of lung field: Secondary | ICD-10-CM | POA: Diagnosis not present

## 2023-02-15 LAB — BLOOD GAS, ARTERIAL
Acid-Base Excess: 0.7 mmol/L (ref 0.0–2.0)
Bicarbonate: 25.4 mmol/L (ref 20.0–28.0)
Drawn by: 20012
FIO2: 36 %
O2 Content: 4 L/min
O2 Saturation: 96.8 %
Patient temperature: 36.7
pCO2 arterial: 40 mmHg (ref 32–48)
pH, Arterial: 7.41 (ref 7.35–7.45)
pO2, Arterial: 71 mmHg — ABNORMAL LOW (ref 83–108)

## 2023-02-15 LAB — GLUCOSE, CAPILLARY: Glucose-Capillary: 113 mg/dL — ABNORMAL HIGH (ref 70–99)

## 2023-02-15 NOTE — Significant Event (Signed)
Rapid Response Event Note   Reason for Call :  Change in status  Initial Focused Assessment:  Patient alert and oriented x 3, disoriented to place. Complaints of "not feeling right" and being drowsy. Last pain medication given at 0130. Blood sugar 113, vital signs WNL (see flowsheets). Patient is on 4L [place], demands fluctuating.   Interventions:  ABG  Plan of Care:  Continue to monitor, awaiting ABG results   Event Summary:   MD Notified: Chinita Greenland, NP Call Time: [phone] Arrival Time: 0620 End Time: 8119  Odette Horns, RN
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
## 2023-02-15 NOTE — TOC Initial Note (Addendum)
Transition of Care [facility]) - Initial/Assessment Note    Patient Details  Name: Mark Porter MRN: [id number] Date of Birth: 08/29/1965  Transition of Care [facility]) CM/SW Contact:    Howell Rucks, RN Phone Number: 02/15/2023, 1:38 PM  Clinical Narrative: Met with pt at bedside to introduce role of TOC/NCM and review for dc planning. Pt reports he recently moved to [place] and that he lives in the [facility] but he is not sure if he still has a room there or if his wheelchair is there. Pt confirms he does not have a PCP in place, pt agreeable to NCM scheduling a post dc follow up appt and one of [place]'s outpatient clinics. Pt reports he will need assitance with transportation at discharge.   TOC consult received from Substance Abuse resources, pt agreeable, added to AVS. TOC consult received from medication assistance, pt has active insurance.  MOON completed. TOC will continue to follow.    -1:52pm Call to [facility], spoke with [facility] staff, reports pt did have a room but he no longer has one, reports his wheelchair and clothes are in the hotel office. NCM will notify patient.  -1:59pm NCM scheduled post dc follow up appointment at [facility], August 19th, 2024 at 11:20am, added to AVS. TOC will continue to follow.                     Barriers to Discharge: Continued Medical Work up   Patient Goals and CMS Choice Patient states their goals for this hospitalization and ongoing recovery are:: unsure          Expected Discharge Plan and Services   Discharge Planning Services: CM Consult   Living arrangements for the past 2 months: Hotel/Motel                                      Prior Living Arrangements/Services Living arrangements for the past 2 months: Hotel/Motel Lives with:: Self Patient language and need for interpreter reviewed:: Yes Do you feel safe going back to the place where you live?: Yes      Need for Family  Participation in Patient Care: Yes (Comment) Care giver support system in place?: Yes (comment) Current home services: DME (wheelchair) Criminal Activity/Legal Involvement Pertinent to Current Situation/Hospitalization: No - Comment as needed  Activities of Daily Living Home Assistive Devices/Equipment: Wheelchair ADL Screening (condition at time of admission) Patient's cognitive ability adequate to safely complete daily activities?: No Is the patient deaf or have difficulty hearing?: No Does the patient have difficulty seeing, even when wearing glasses/contacts?: No Does the patient have difficulty concentrating, remembering, or making decisions?: No Patient able to express need for assistance with ADLs?: Yes Does the patient have difficulty dressing or bathing?: No Independently performs ADLs?: No Communication: Independent Dressing (OT): Needs assistance Is this a change from baseline?: Pre-admission baseline Grooming: Needs assistance Is this a change from baseline?: Pre-admission baseline Feeding: Independent Bathing: Needs assistance Is this a change from baseline?: Pre-admission baseline Toileting: Independent In/Out Bed: Independent with device (comment) (wheelchair) Does the patient have difficulty walking or climbing stairs?: Yes Weakness of Legs: Both Weakness of Arms/Hands: None  Permission Sought/Granted Permission sought to share information with : Case Manager Permission granted to share information with : Yes, Verbal Permission Granted  Share Information with NAME: Fannie Knee, RN  Emotional Assessment Appearance:: Appears older than stated age Attitude/Demeanor/Rapport: Gracious Affect (typically observed): Accepting Orientation: : Oriented to Self, Oriented to  Time, Oriented to Place Alcohol / Substance Use: Illicit Drugs Psych Involvement: No (comment)  Admission diagnosis:  Ventral hernia without obstruction or gangrene [K43.9] Abnormal  chest x-ray with multiple lung nodules [R91.8] Hypotension [I95.9] Opiate overdose, accidental or unintentional, initial encounter [facility]) [T40.601A] Patient Active Problem List   Diagnosis Date Noted   Hypotension 02/14/2023   Altered mental status    Agitation    Encephalopathy acute 05/24/2020   C5-C7 incomplete quadriplegia (HCC) 09/10/2016   PCP:  Devra Dopp, MD Pharmacy:   [facility] [place] - [address] [place] [address] Phone: [phone] Fax: [phone]     Social Determinants of Health (SDOH) Social History: SDOH Screenings   Housing: Patient Declined (02/15/2023)  Utilities: Patient Declined (02/15/2023)  Social Connections: Unknown (11/23/2021)   Received from [facility]  Tobacco Use: Low Risk  (02/15/2023)   SDOH Interventions:     Readmission Risk Interventions     No data to display

## 2023-02-15 NOTE — Plan of Care (Signed)
  Problem: Health Behavior/Discharge Planning: Goal: Ability to manage health-related needs will improve Outcome: Progressing   

## 2023-02-15 NOTE — Care Management Obs Status (Signed)
MEDICARE OBSERVATION STATUS NOTIFICATION   Patient Details  Name: Mark Porter MRN: [id number] Date of Birth: 1965/09/04   Medicare Observation Status Notification Given:       Howell Rucks, RN 02/15/2023, 1:35 PM

## 2023-02-16 DIAGNOSIS — T40601A Poisoning by unspecified narcotics, accidental (unintentional), initial encounter: Secondary | ICD-10-CM | POA: Diagnosis not present

## 2023-02-16 DIAGNOSIS — R918 Other nonspecific abnormal finding of lung field: Secondary | ICD-10-CM | POA: Diagnosis not present

## 2023-02-16 MED ORDER — ALBUTEROL SULFATE (2.5 MG/3ML) 0.083% IN NEBU
2.5000 mg | INHALATION_SOLUTION | RESPIRATORY_TRACT | Status: DC | PRN
  Administered 2023-02-16 – 2023-02-23 (×7): 2.5 mg via RESPIRATORY_TRACT
  Filled 2023-02-16 (×7): qty 3

## 2023-02-16 NOTE — Plan of Care (Signed)
  Problem: Activity: Goal: Risk for activity intolerance will decrease 02/16/2023 0612 by Jacolyn Reedy, RN Outcome: Progressing 02/16/2023 0611 by Jacolyn Reedy, RN Outcome: Progressing
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
## 2023-02-17 DIAGNOSIS — T40601A Poisoning by unspecified narcotics, accidental (unintentional), initial encounter: Secondary | ICD-10-CM | POA: Diagnosis not present

## 2023-02-17 DIAGNOSIS — R918 Other nonspecific abnormal finding of lung field: Secondary | ICD-10-CM | POA: Diagnosis not present

## 2023-02-17 MED ORDER — ALBUTEROL SULFATE (2.5 MG/3ML) 0.083% IN NEBU
2.5000 mg | INHALATION_SOLUTION | Freq: Two times a day (BID) | RESPIRATORY_TRACT | Status: DC
  Administered 2023-02-17 – 2023-02-19 (×4): 2.5 mg via RESPIRATORY_TRACT
  Filled 2023-02-17 (×3): qty 3

## 2023-02-17 MED ORDER — METHYLPREDNISOLONE SODIUM SUCC 40 MG IJ SOLR
40.0000 mg | Freq: Every day | INTRAMUSCULAR | Status: DC
  Administered 2023-02-17 – 2023-02-19 (×3): 40 mg via INTRAVENOUS
  Filled 2023-02-17 (×3): qty 1

## 2023-02-17 NOTE — Plan of Care (Signed)
  Problem: Coping: Goal: Level of anxiety will decrease Outcome: Progressing   Problem: Pain Managment: Goal: General experience of comfort will improve Outcome: Progressing   Problem: Safety: Goal: Ability to remain free from injury will improve Outcome: Progressing   

## 2023-02-17 NOTE — [place] FL2 (Addendum)
[place] MEDICAID FL2 LEVEL OF CARE FORM     IDENTIFICATION  Patient Name: Mark Porter Birthdate: 07-26-1965 Sex: male Admission Date (Current Location): 02/14/2023  [place] and [place] Number:  [name] [id number] Facility and Address:  [facility],  [address], [place] [address]      Provider Number: [id number]  Attending Physician Name and Address:  Jerald Kief, MD  Relative Name and Phone Number:  Shubh, Reynaud (Daughter)  [phone] [facility] Phone)    Current Level of Care: Hospital Recommended Level of Care: Nursing Facility Prior Approval Number:    Date Approved/Denied:   PASRR Number: [id number]  Discharge Plan: SNF (LTC)    Current Diagnoses: Patient Active Problem List   Diagnosis Date Noted   Hypotension 02/14/2023   Altered mental status    Agitation    Encephalopathy acute 05/24/2020   C5-C7 incomplete quadriplegia (HCC) 09/10/2016    Orientation RESPIRATION BLADDER Height & Weight     Self, Time, Situation, Place  O2 Incontinent, External catheter Weight: 86.2 kg Height:  5\' 7"  (170.2 cm)  BEHAVIORAL SYMPTOMS/MOOD NEUROLOGICAL BOWEL NUTRITION STATUS      Incontinent Diet (regular)  AMBULATORY STATUS COMMUNICATION OF NEEDS Skin   Total Care (Quadraplegic) Verbally Normal                       Personal Care Assistance Level of Assistance  Bathing, Feeding, Dressing Bathing Assistance: Limited assistance Feeding assistance: Limited assistance Dressing Assistance: Limited assistance     Functional Limitations Info  Sight, Hearing, Speech Sight Info: Adequate Hearing Info: Adequate Speech Info: Adequate    SPECIAL CARE FACTORS FREQUENCY   (LTC placement)                    Contractures Contractures Info: Not present    Additional Factors Info  Code Status, Allergies, Psychotropic Code Status Info: Full code Allergies Info: Carisoprodol Psychotropic Info: N/A         Current Medications  (02/17/2023):  This is the current hospital active medication list Current Facility-Administered Medications  Medication Dose Route Frequency Provider Last Rate Last Admin   0.9 %  sodium chloride infusion   Intravenous Continuous Kirby Crigler, Mir M, MD 150 mL/hr at 02/17/23 1448 New Bag at 02/17/23 1448   acetaminophen (TYLENOL) tablet 650 mg  650 mg Oral Q6H PRN Maryln Gottron, MD   650 mg at 02/16/23 2202   Or   acetaminophen (TYLENOL) suppository 650 mg  650 mg Rectal Q6H PRN Kirby Crigler, Mir M, MD       albuterol (PROVENTIL) (2.5 MG/3ML) 0.083% nebulizer solution 2.5 mg  2.5 mg Nebulization Q4H PRN Jerald Kief, MD   2.5 mg at 02/17/23 1206   albuterol (PROVENTIL) (2.5 MG/3ML) 0.083% nebulizer solution 2.5 mg  2.5 mg Nebulization BID Jerald Kief, MD       enoxaparin (LOVENOX) injection 40 mg  40 mg Subcutaneous Q24H Kirby Crigler, Mir M, MD   40 mg at 02/16/23 2123   methylPREDNISolone sodium succinate (SOLU-MEDROL) 40 mg/mL injection 40 mg  40 mg Intravenous Daily Jerald Kief, MD   40 mg at 02/17/23 1458   naloxone [facility]) injection 0.4 mg  0.4 mg Intravenous PRN Gloris Manchester, MD       ondansetron [facility]) tablet 4 mg  4 mg Oral Q6H PRN Kirby Crigler, Mir M, MD       Or   ondansetron [facility]) injection 4 mg  4 mg Intravenous Q6H  PRN Kirby Crigler, Mir M, MD       oxyCODONE (Oxy IR/ROXICODONE) immediate release tablet 10 mg  10 mg Oral Q6H PRN Kirby Crigler, Mir M, MD   10 mg at 02/17/23 1112   traZODone (DESYREL) tablet 25 mg  25 mg Oral QHS PRN Maryln Gottron, MD   25 mg at 02/16/23 2235     Discharge Medications: Please see discharge summary for a list of discharge medications.  Relevant Imaging Results:  Relevant Lab Results:   Additional Information SSN: [id number]  Howell Rucks, RN
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
## 2023-02-17 NOTE — TOC Progression Note (Addendum)
Transition of Care [facility]) - Progression Note    Patient Details  Name: QUINTA LABELLA MRN: [id number] Date of Birth: 1966/03/06  Transition of Care [facility]) CM/SW Contact  Howell Rucks, RN Phone Number: 02/17/2023, 2:22 PM  Clinical Narrative:  [facility] consult for assistance with Homelessness. NCM met with pt at bedside, pt confirmed he was residing in a hotel prior to admission, reports he is interested in ALF or LTC placement because he can no longer care for himself. Pt confirmed he receives a monthly SSA and Disability check( reports $944 total) NCM explained to pt that ALF would be out of pocket expense and that if he decides on LTC placement, the majority of his checks would go to the facility for payment. Pt agreeable to LTC placement. NCM will fax out for bed offers. TOC will continue to follow.   -3:08pm PASRR and FL2 completed. Faxed out for LTC placement. TOC will continue to follow.       Barriers to Discharge: Continued Medical Work up  Expected Discharge Plan and Services   Discharge Planning Services: CM Consult   Living arrangements for the past 2 months: Hotel/Motel                                       Social Determinants of Health (SDOH) Interventions SDOH Screenings   Housing: Patient Declined (02/17/2023)  Utilities: Patient Declined (02/15/2023)  Social Connections: Unknown (11/23/2021)   Received from [facility], [facility]  Tobacco Use: Low Risk  (02/15/2023)    Readmission Risk Interventions     No data to display

## 2023-02-18 DIAGNOSIS — R918 Other nonspecific abnormal finding of lung field: Secondary | ICD-10-CM | POA: Diagnosis not present

## 2023-02-18 DIAGNOSIS — T40601A Poisoning by unspecified narcotics, accidental (unintentional), initial encounter: Secondary | ICD-10-CM | POA: Diagnosis not present

## 2023-02-18 LAB — CBC
HCT: 39.6 % (ref 39.0–52.0)
Hemoglobin: 13 g/dL (ref 13.0–17.0)
MCH: 27.4 pg (ref 26.0–34.0)
MCHC: 32.8 g/dL (ref 30.0–36.0)
MCV: 83.5 fL (ref 80.0–100.0)
Platelets: 464 10*3/uL — ABNORMAL HIGH (ref 150–400)
RBC: 4.74 MIL/uL (ref 4.22–5.81)
RDW: 14.2 % (ref 11.5–15.5)
WBC: 17.6 10*3/uL — ABNORMAL HIGH (ref 4.0–10.5)
nRBC: 0 % (ref 0.0–0.2)

## 2023-02-18 NOTE — Plan of Care (Signed)
  Problem: Nutrition: Goal: Adequate nutrition will be maintained Outcome: Progressing   Problem: Safety: Goal: Ability to remain free from injury will improve Outcome: Progressing   

## 2023-02-18 NOTE — Plan of Care (Signed)
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
## 2023-02-18 NOTE — TOC Progression Note (Signed)
Transition of Care [facility]) - Progression Note    Patient Details  Name: Mark Porter MRN: [id number] Date of Birth: 1966-02-19  Transition of [facility]) CM/SW Contact  Howell Rucks, RN Phone Number: 02/18/2023, 4:24 PM  Clinical Narrative: No LTC bed offers at this time. [facility] Supervisor consulted for potential difficult to place. TOC will continue to follow.          Barriers to Discharge: Continued Medical Work up  Expected Discharge Plan and Services   Discharge Planning Services: CM Consult   Living arrangements for the past 2 months: Hotel/Motel                                       Social Determinants of Health (SDOH) Interventions SDOH Screenings   Housing: Patient Declined (02/15/2023)  Utilities: Patient Declined (02/15/2023)  Social Connections: Unknown (11/23/2021)   Received from [facility], [facility]  Tobacco Use: Low Risk  (02/15/2023)    Readmission Risk Interventions     No data to display

## 2023-02-19 DIAGNOSIS — R918 Other nonspecific abnormal finding of lung field: Secondary | ICD-10-CM | POA: Diagnosis not present

## 2023-02-19 DIAGNOSIS — T40601A Poisoning by unspecified narcotics, accidental (unintentional), initial encounter: Secondary | ICD-10-CM | POA: Diagnosis not present

## 2023-02-19 DIAGNOSIS — K439 Ventral hernia without obstruction or gangrene: Secondary | ICD-10-CM | POA: Diagnosis not present

## 2023-02-19 MED ORDER — OXYCODONE HCL 5 MG PO TABS
15.0000 mg | ORAL_TABLET | Freq: Four times a day (QID) | ORAL | Status: DC | PRN
  Administered 2023-02-19 – 2023-02-24 (×19): 15 mg via ORAL
  Filled 2023-02-19 (×19): qty 3

## 2023-02-19 MED ORDER — PREDNISONE 20 MG PO TABS
40.0000 mg | ORAL_TABLET | Freq: Every day | ORAL | Status: DC
  Administered 2023-02-20: 40 mg via ORAL
  Filled 2023-02-19: qty 2

## 2023-02-19 NOTE — Plan of Care (Signed)
  Problem: Pain Managment: Goal: General experience of comfort will improve Outcome: Progressing   Problem: Safety: Goal: Ability to remain free from injury will improve Outcome: Progressing   
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
## 2023-02-19 NOTE — Plan of Care (Signed)
Continue with plan of care.  

## 2023-02-19 NOTE — Plan of Care (Signed)

## 2023-02-20 DIAGNOSIS — T40601A Poisoning by unspecified narcotics, accidental (unintentional), initial encounter: Secondary | ICD-10-CM | POA: Diagnosis not present

## 2023-02-20 DIAGNOSIS — R918 Other nonspecific abnormal finding of lung field: Secondary | ICD-10-CM | POA: Diagnosis not present

## 2023-02-20 MED ORDER — PREDNISONE 20 MG PO TABS
20.0000 mg | ORAL_TABLET | Freq: Every day | ORAL | Status: AC
  Administered 2023-02-21 – 2023-02-23 (×3): 20 mg via ORAL
  Filled 2023-02-20 (×3): qty 1
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
## 2023-02-21 DIAGNOSIS — T40601A Poisoning by unspecified narcotics, accidental (unintentional), initial encounter: Secondary | ICD-10-CM | POA: Diagnosis not present

## 2023-02-21 DIAGNOSIS — R918 Other nonspecific abnormal finding of lung field: Secondary | ICD-10-CM | POA: Diagnosis not present

## 2023-02-21 NOTE — Plan of Care (Signed)
  Problem: Education: Goal: Knowledge of General Education information will improve Description: Including pain rating scale, medication(s)/side effects and non-pharmacologic comfort measures Outcome: Progressing   Problem: Health Behavior/Discharge Planning: Goal: Ability to manage health-related needs will improve Outcome: Progressing Problem: Activity: Goal: Risk for activity intolerance will decrease Outcome: Progressing   Problem: Nutrition: Goal: Adequate nutrition will be maintained Outcome: Progressing   Problem: Coping: Goal: Level of anxiety will decrease Outcome: Progressing   Problem: Elimination: Goal: Will not experience complications related to bowel motility Outcome: Progressing Goal: Will not experience complications related to urinary retention Outcome: Progressing   Problem: Pain Managment: Goal: General experience of comfort will improve Outcome: Not Progressing Note: Chronic pain   Problem: Safety: Goal: Ability to remain free from injury will improve Outcome: Not Progressing Note: WC bound   Problem: Skin Integrity: Goal: Risk for impaired skin integrity will decrease Outcome: Not Progressing Note: WC bound and patient scratches and picks at skin     Problem: Clinical Measurements: Goal: Ability to maintain clinical measurements within normal limits will improve Outcome: Progressing Goal: Will remain free from infection Outcome: Progressing Goal: Diagnostic test results will improve Outcome: Progressing Goal: Respiratory complications will improve Outcome: Progressing Goal: Cardiovascular complication will be avoided Outcome: Progressing
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
## 2023-02-22 DIAGNOSIS — T40601A Poisoning by unspecified narcotics, accidental (unintentional), initial encounter: Secondary | ICD-10-CM | POA: Diagnosis not present

## 2023-02-22 DIAGNOSIS — R918 Other nonspecific abnormal finding of lung field: Secondary | ICD-10-CM | POA: Diagnosis not present

## 2023-02-22 MED ORDER — MORPHINE SULFATE ER 15 MG PO TBCR
15.0000 mg | EXTENDED_RELEASE_TABLET | Freq: Every day | ORAL | Status: DC
  Administered 2023-02-22 – 2023-02-24 (×3): 15 mg via ORAL
  Filled 2023-02-22 (×3): qty 1

## 2023-02-22 MED ORDER — PREGABALIN 25 MG PO CAPS
25.0000 mg | ORAL_CAPSULE | Freq: Two times a day (BID) | ORAL | Status: DC
  Administered 2023-02-22 – 2023-02-24 (×5): 25 mg via ORAL
  Filled 2023-02-22 (×5): qty 1

## 2023-02-22 MED ORDER — BACLOFEN 10 MG PO TABS
10.0000 mg | ORAL_TABLET | Freq: Three times a day (TID) | ORAL | Status: DC
  Administered 2023-02-22 – 2023-02-24 (×6): 10 mg via ORAL
  Filled 2023-02-22 (×6): qty 1

## 2023-02-22 NOTE — Care Management Important Message (Signed)
Important Message  Patient Details IM Letter given. Name: Mark Porter MRN: [id number] Date of Birth: 27-Jan-1966   Medicare Important Message Given:  Yes     Caren Macadam 02/22/2023, 2:04 PM
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
## 2023-02-22 NOTE — Plan of Care (Signed)
  Problem: Education: Goal: Knowledge of General Education information will improve Description: Including pain rating scale, medication(s)/side effects and non-pharmacologic comfort measures Outcome: Progressing   Problem: Health Behavior/Discharge Planning: Goal: Ability to manage health-related needs will improve Outcome: Progressing   Problem: Pain Managment: Goal: General experience of comfort will improve Outcome: Progressing   Problem: Activity: Goal: Risk for activity intolerance will decrease Outcome: Not Progressing   Problem: Clinical Measurements: Goal: Will remain free from infection Outcome: Adequate for Discharge Goal: Diagnostic test results will improve Outcome: Adequate for Discharge Goal: Respiratory complications will improve Outcome: Adequate for Discharge Goal: Cardiovascular complication will be avoided Outcome: Adequate for Discharge   Problem: Coping: Goal: Level of anxiety will decrease Outcome: Adequate for Discharge   Problem: Elimination: Goal: Will not experience complications related to bowel motility Outcome: Adequate for Discharge Goal: Will not experience complications related to urinary retention Outcome: Adequate for Discharge   Problem: Safety: Goal: Ability to remain free from injury will improve Outcome: Adequate for Discharge   Problem: Skin Integrity: Goal: Risk for impaired skin integrity will decrease Outcome: Adequate for Discharge

## 2023-02-23 DIAGNOSIS — G903 Multi-system degeneration of the autonomic nervous system: Secondary | ICD-10-CM | POA: Diagnosis not present

## 2023-02-23 NOTE — Consult Note (Signed)
Psych consult received for suicidal ideations. Patient endorsing suicidal ideations with a plan to cut wrist once discharged. Consult is not urgent and patient will be seen in the morning by consult service.   Agree with primary team to start suicide precautions and suicide sitter.  -In the interim please remove all of patients belongings, phone and electronic devices from room. Patient may also be placed in purple scrubs if available.  -Will see tomorrow.

## 2023-02-23 NOTE — Plan of Care (Signed)

## 2023-02-23 NOTE — TOC Progression Note (Signed)
Transition of Care [facility]) - Progression Note    Patient Details  Name: Mark Porter MRN: [id number] Date of Birth: 12/02/1965  Transition [facility] Care [facility]) CM/SW Contact  Armanda Heritage, RN Phone Number: 02/23/2023, 10:03 AM  Clinical Narrative:    [facility] is reviewing patient.     Barriers to Discharge: Continued Medical Work up  Expected Discharge Plan and Services   Discharge Planning Services: CM Consult   Living arrangements for the past 2 months: Hotel/Motel                                       Social Determinants of Health (SDOH) Interventions SDOH Screenings   Housing: Patient Declined (02/15/2023)  Utilities: Patient Declined (02/15/2023)  Social Connections: Unknown (11/23/2021)   Received from [facility], [facility]  Tobacco Use: Low Risk  (02/15/2023)    Readmission Risk Interventions     No data to display
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
## 2023-02-23 NOTE — TOC Progression Note (Signed)
Transition of Care [facility]) - Progression Note    Patient Details  Name: Mark Porter MRN: [id number] Date of Birth: 23-Apr-1966  Transition of Care [facility]) CM/SW Contact  Armanda Heritage, RN Phone Number: 02/23/2023, 10:53 AM  Clinical Narrative:    [facility] corporate rep, reports no facilities under corporate umbrella can offer LTC bed at this time.  Patient remains with no LTC bed offers.     Barriers to Discharge: Continued Medical Work up  Expected Discharge Plan and Services   Discharge Planning Services: CM Consult   Living arrangements for the past 2 months: Hotel/Motel                                       Social Determinants of Health (SDOH) Interventions SDOH Screenings   Housing: Patient Declined (02/15/2023)  Utilities: Patient Declined (02/15/2023)  Social Connections: Unknown (11/23/2021)   Received from [facility], [facility]  Tobacco Use: Low Risk  (02/15/2023)    Readmission Risk Interventions     No data to display

## 2023-02-23 NOTE — TOC Progression Note (Signed)
Transition of Care [facility]) - Progression Note    Patient Details  Name: Mark Porter MRN: [id number] Date of Birth: Apr 30, 1966  Transition of Care [facility]) CM/SW Contact  Howell Rucks, RN Phone Number: 02/23/2023, 11:34 AM  Clinical Narrative:   Met with pt at bedside, discussed no LTC bed offers, NCM discussed option of  long term hotel or homeless shelter. Pt states he will "die on the streets",  upon walking out of the room pt states " might as well cut my wrist". Attending notified, Psych consult ordered and SI sitter. TOC will continue to follow.       Barriers to Discharge: Continued Medical Work up  Expected Discharge Plan and Services   Discharge Planning Services: CM Consult   Living arrangements for the past 2 months: Hotel/Motel Expected Discharge Date: 02/23/23                                     Social Determinants of Health (SDOH) Interventions SDOH Screenings   Housing: Patient Declined (02/15/2023)  Utilities: Patient Declined (02/15/2023)  Social Connections: Unknown (11/23/2021)   Received from [facility], [facility]  Tobacco Use: Low Risk  (02/15/2023)    Readmission Risk Interventions     No data to display

## 2023-02-24 DIAGNOSIS — F1910 Other psychoactive substance abuse, uncomplicated: Secondary | ICD-10-CM

## 2023-02-24 DIAGNOSIS — T50904A Poisoning by unspecified drugs, medicaments and biological substances, undetermined, initial encounter: Secondary | ICD-10-CM

## 2023-02-24 DIAGNOSIS — Z5900 Homelessness unspecified: Secondary | ICD-10-CM

## 2023-02-24 DIAGNOSIS — R45851 Suicidal ideations: Secondary | ICD-10-CM

## 2023-02-24 DIAGNOSIS — T40601A Poisoning by unspecified narcotics, accidental (unintentional), initial encounter: Secondary | ICD-10-CM

## 2023-02-24 DIAGNOSIS — I950 Idiopathic hypotension: Secondary | ICD-10-CM

## 2023-02-24 NOTE — TOC Transition Note (Addendum)
Transition of Care [facility]) - CM/SW Discharge Note   Patient Details  Name: Mark Porter MRN: [id number] Date of Birth: Mar 13, 1966  Transition [facility] Care [facility]) CM/SW Contact:  Howell Rucks, RN Phone Number: 02/24/2023, 11:59 AM   Clinical Narrative:   Patient seen by Psych, reports does not meet criteria for inpatient psychiatric admission. Met with pt at bedside to discuss discharge disposition, pt reports he has no one to call and no one to stay with. NCM gave options for homeless shelters, pt asking for guarantee of bed at shelter, explained NCM unable to guarantee. NCM explained to pt will provide transportation to [facility] via [employer], pt did not respond, did not voice understanding. No further TOC needs identified.    -12:43pm [facility] called for transfer to [facility]. No further TOC needs identified.     Final next level of care: Homeless Shelter Barriers to Discharge: Barriers Resolved   Patient Goals and CMS Choice      Discharge Placement                         Discharge Plan and Services Additional resources added to the After Visit Summary for     Discharge Planning Services: CM Consult                                 Social Determinants of Health (SDOH) Interventions SDOH Screenings   Housing: Patient Declined (02/15/2023)  Utilities: Patient Declined (02/15/2023)  Social Connections: Unknown (11/23/2021)   Received from [facility], [facility]  Tobacco Use: Low Risk  (02/15/2023)     Readmission Risk Interventions    02/24/2023   11:58 AM  Readmission Risk Prevention Plan  Post Dischage Appt Complete  Medication Screening Complete  Transportation Screening Complete
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
## 2023-02-24 NOTE — Plan of Care (Signed)
  Problem: Coping: Goal: Level of anxiety will decrease Outcome: Progressing   Problem: Pain Managment: Goal: General experience of comfort will improve Outcome: Progressing   Problem: Safety: Goal: Ability to remain free from injury will improve Outcome: Progressing   
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
## 2023-02-27 ENCOUNTER — Other Ambulatory Visit: Payer: Self-pay

## 2023-02-27 ENCOUNTER — Emergency Department (HOSPITAL_COMMUNITY): Payer: Medicare HMO

## 2023-02-27 ENCOUNTER — Emergency Department (HOSPITAL_COMMUNITY)
Admission: EM | Admit: 2023-02-27 | Discharge: 2023-02-27 | Disposition: A | Discharge status: Home / Self Care | Payer: Medicare HMO | Attending: Emergency Medicine | Admitting: Emergency Medicine

## 2023-02-27 ENCOUNTER — Encounter (HOSPITAL_COMMUNITY): Payer: Self-pay

## 2023-02-27 DIAGNOSIS — I700 Atherosclerosis of aorta: Secondary | ICD-10-CM | POA: Diagnosis not present

## 2023-02-27 DIAGNOSIS — K439 Ventral hernia without obstruction or gangrene: Secondary | ICD-10-CM | POA: Insufficient documentation

## 2023-02-27 DIAGNOSIS — R404 Transient alteration of awareness: Secondary | ICD-10-CM

## 2023-02-27 DIAGNOSIS — R4182 Altered mental status, unspecified: Secondary | ICD-10-CM | POA: Diagnosis present

## 2023-02-27 LAB — COMPREHENSIVE METABOLIC PANEL
ALT: 123 U/L — ABNORMAL HIGH (ref 0–44)
AST: 94 U/L — ABNORMAL HIGH (ref 15–41)
Albumin: 3.3 g/dL — ABNORMAL LOW (ref 3.5–5.0)
Alkaline Phosphatase: 130 U/L — ABNORMAL HIGH (ref 38–126)
Anion gap: 15 (ref 5–15)
BUN: 19 mg/dL (ref 6–20)
CO2: 19 mmol/L — ABNORMAL LOW (ref 22–32)
Calcium: 9 mg/dL (ref 8.9–10.3)
Chloride: 104 mmol/L (ref 98–111)
Creatinine, Ser: 0.91 mg/dL (ref 0.61–1.24)
GFR, Estimated: >60 mL/min (ref >60)
Glucose, Bld: 113 mg/dL — ABNORMAL HIGH (ref 70–99)
Potassium: 4 mmol/L (ref 3.5–5.1)
Sodium: 138 mmol/L (ref 135–145)
Total Bilirubin: 1.3 mg/dL — ABNORMAL HIGH (ref 0.3–1.2)
Total Protein: 8.3 g/dL — ABNORMAL HIGH (ref 6.5–8.1)

## 2023-02-27 LAB — CBC WITH DIFFERENTIAL/PLATELET
Abs Immature Granulocytes: 0.11 10*3/uL — ABNORMAL HIGH (ref 0.00–0.07)
Basophils Absolute: 0.1 10*3/uL (ref 0.0–0.1)
Basophils Relative: 0 %
Eosinophils Absolute: 0.1 10*3/uL (ref 0.0–0.5)
Eosinophils Relative: 1 %
HCT: 44.2 % (ref 39.0–52.0)
Hemoglobin: 14.5 g/dL (ref 13.0–17.0)
Immature Granulocytes: 1 %
Lymphocytes Relative: 24 %
Lymphs Abs: 3.5 10*3/uL (ref 0.7–4.0)
MCH: 26.4 pg (ref 26.0–34.0)
MCHC: 32.8 g/dL (ref 30.0–36.0)
MCV: 80.5 fL (ref 80.0–100.0)
Monocytes Absolute: 1.8 10*3/uL — ABNORMAL HIGH (ref 0.1–1.0)
Monocytes Relative: 13 %
Neutro Abs: 9 10*3/uL — ABNORMAL HIGH (ref 1.7–7.7)
Neutrophils Relative %: 61 %
Platelets: 608 10*3/uL — ABNORMAL HIGH (ref 150–400)
RBC: 5.49 MIL/uL (ref 4.22–5.81)
RDW: 14.3 % (ref 11.5–15.5)
WBC: 14.6 10*3/uL — ABNORMAL HIGH (ref 4.0–10.5)
nRBC: 0 % (ref 0.0–0.2)

## 2023-02-27 LAB — CK: Total CK: 138 U/L (ref 49–397)

## 2023-02-27 LAB — ETHANOL: Alcohol, Ethyl (B): <10 mg/dL (ref <10)

## 2023-02-27 LAB — I-STAT CG4 LACTIC ACID, ED: Lactic Acid, Venous: 1.6 mmol/L (ref 0.5–1.9)

## 2023-02-27 LAB — SALICYLATE LEVEL: Salicylate Lvl: <7 mg/dL — ABNORMAL LOW (ref 7.0–30.0)

## 2023-02-27 LAB — ACETAMINOPHEN LEVEL: Acetaminophen (Tylenol), Serum: <10 ug/mL — ABNORMAL LOW (ref 10–30)

## 2023-02-27 NOTE — ED Notes (Signed)
Attempted twice for labs, unsuccessful.

## 2023-02-27 NOTE — Progress Notes (Signed)
Orthopedic Tech Progress Note Patient Details:  Mark Porter 1966/06/18 [id number]  Level II trauma, not needed.  Patient ID: [name], male   DOB: 16-Feb-1966, 57 y.o.   MRN: [id number]  Docia Furl 02/27/2023, 4:48 PM

## 2023-02-27 NOTE — Discharge Instructions (Signed)
Avoid gummy bears and do not ingest illegal substances. Contact a health care provider if: Symptoms do not get better or they become worse. New symptoms of delirium develop. Caring for the person at home does not seem safe. Eating, drinking, or communicating stops. There are side effects of medicines, such as changes in sleep patterns, dizziness, weight gain, restlessness, movement changes, or tremors. Get help right away if: The person has thoughts of harming self or harming others. There are serious side effects of medicine, such as: Swelling of the face, lips, tongue, or throat. Fever, confusion, muscle spasms, or seizures. If you ever feel like a loved one may hurt himself or herself or others, or shares thoughts about taking his or her own life, get help right away. You can go to your nearest emergency department or: Call your local emergency services (911 in the [place] Call a suicide crisis helpline, such as the National Suicide Prevention Lifeline at 778-104-1260 or 988 in the [place] This is open 24 hours a day in the [place] Text the Crisis Text Line at 832-622-8311 (in the [place]
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
## 2023-02-27 NOTE — ED Notes (Signed)
PTAR Called pt to [address] NO ETA
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
## 2023-02-27 NOTE — ED Notes (Signed)
Pt recently moved in a new residence, unable to recall address. Secretary calling dispatch to help locate where pt was picked up from. Pt given snacks, sandwich, cola. Pending discharge via PTAR

## 2023-02-28 ENCOUNTER — Inpatient Hospital Stay: Payer: Self-pay | Admitting: Nurse Practitioner

## 2023-03-16 ENCOUNTER — Emergency Department (HOSPITAL_COMMUNITY)
Admission: EM | Admit: 2023-03-16 | Discharge: 2023-03-16 | Disposition: A | Discharge status: Home / Self Care | Payer: Medicare HMO | Attending: Emergency Medicine | Admitting: Emergency Medicine

## 2023-03-16 ENCOUNTER — Encounter (HOSPITAL_COMMUNITY): Payer: Self-pay

## 2023-03-16 ENCOUNTER — Other Ambulatory Visit (HOSPITAL_COMMUNITY): Payer: Self-pay

## 2023-03-16 ENCOUNTER — Telehealth (HOSPITAL_COMMUNITY): Payer: Self-pay | Admitting: Emergency Medicine

## 2023-03-16 DIAGNOSIS — S70322A Blister (nonthermal), left thigh, initial encounter: Secondary | ICD-10-CM | POA: Diagnosis present

## 2023-03-16 DIAGNOSIS — X118XXA Contact with other hot tap-water, initial encounter: Secondary | ICD-10-CM | POA: Diagnosis not present

## 2023-03-16 DIAGNOSIS — T300 Burn of unspecified body region, unspecified degree: Secondary | ICD-10-CM

## 2023-03-16 MED ORDER — HYDROCODONE-ACETAMINOPHEN 5-325 MG PO TABS
1.0000 | ORAL_TABLET | ORAL | 0 refills | Status: DC | PRN
Start: 2023-03-16 — End: 2023-03-16

## 2023-03-16 MED ORDER — HYDROCODONE-ACETAMINOPHEN 5-325 MG PO TABS
1.0000 | ORAL_TABLET | ORAL | 0 refills | Status: DC | PRN
Start: 2023-03-16 — End: 2023-05-25
  Filled 2023-03-16 – 2023-03-17 (×2): qty 6, 1d supply, fill #0

## 2023-03-16 MED ORDER — HYDROMORPHONE HCL 1 MG/ML IJ SOLN
1.0000 mg | Freq: Once | INTRAMUSCULAR | Status: AC
  Administered 2023-03-16: 1 mg via INTRAMUSCULAR
  Filled 2023-03-16: qty 1
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
## 2023-03-16 NOTE — Telephone Encounter (Signed)
Previous pharmacy did not have medication that was prescribed.  I was asked to resend this to the patient's choice of pharmacy.
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
## 2023-03-17 ENCOUNTER — Other Ambulatory Visit (HOSPITAL_COMMUNITY): Payer: Self-pay

## 2023-04-15 ENCOUNTER — Other Ambulatory Visit: Payer: Self-pay

## 2023-04-15 ENCOUNTER — Emergency Department (HOSPITAL_COMMUNITY)
Admission: EM | Admit: 2023-04-15 | Discharge: 2023-04-16 | Disposition: A | Discharge status: Home / Self Care | Payer: Medicare HMO | Attending: Emergency Medicine | Admitting: Emergency Medicine

## 2023-04-15 ENCOUNTER — Encounter (HOSPITAL_COMMUNITY): Payer: Self-pay | Admitting: Emergency Medicine

## 2023-04-15 ENCOUNTER — Emergency Department (HOSPITAL_COMMUNITY): Payer: Medicare HMO

## 2023-04-15 DIAGNOSIS — R079 Chest pain, unspecified: Secondary | ICD-10-CM | POA: Insufficient documentation

## 2023-04-15 DIAGNOSIS — R197 Diarrhea, unspecified: Secondary | ICD-10-CM | POA: Diagnosis not present

## 2023-04-15 DIAGNOSIS — Z20822 Contact with and (suspected) exposure to covid-19: Secondary | ICD-10-CM | POA: Insufficient documentation

## 2023-04-15 DIAGNOSIS — I4510 Unspecified right bundle-branch block: Secondary | ICD-10-CM | POA: Diagnosis not present

## 2023-04-15 DIAGNOSIS — Z1152 Encounter for screening for COVID-19: Secondary | ICD-10-CM | POA: Diagnosis not present

## 2023-04-15 DIAGNOSIS — R531 Weakness: Secondary | ICD-10-CM | POA: Insufficient documentation

## 2023-04-15 LAB — COMPREHENSIVE METABOLIC PANEL
ALT: 63 U/L — ABNORMAL HIGH (ref 0–44)
AST: 68 U/L — ABNORMAL HIGH (ref 15–41)
Albumin: 2.8 g/dL — ABNORMAL LOW (ref 3.5–5.0)
Alkaline Phosphatase: 59 U/L (ref 38–126)
Anion gap: 13 (ref 5–15)
BUN: 14 mg/dL (ref 6–20)
CO2: 14 mmol/L — ABNORMAL LOW (ref 22–32)
Calcium: 7.3 mg/dL — ABNORMAL LOW (ref 8.9–10.3)
Chloride: 113 mmol/L — ABNORMAL HIGH (ref 98–111)
Creatinine, Ser: 0.59 mg/dL — ABNORMAL LOW (ref 0.61–1.24)
GFR, Estimated: >60 mL/min (ref >60)
Glucose, Bld: 85 mg/dL (ref 70–99)
Potassium: 3.4 mmol/L — ABNORMAL LOW (ref 3.5–5.1)
Sodium: 140 mmol/L (ref 135–145)
Total Bilirubin: 0.7 mg/dL (ref 0.3–1.2)
Total Protein: 6.5 g/dL (ref 6.5–8.1)

## 2023-04-15 LAB — CBC WITH DIFFERENTIAL/PLATELET
Abs Immature Granulocytes: 0.04 10*3/uL (ref 0.00–0.07)
Basophils Absolute: 0.1 10*3/uL (ref 0.0–0.1)
Basophils Relative: 1 %
Eosinophils Absolute: 0 10*3/uL (ref 0.0–0.5)
Eosinophils Relative: 0 %
HCT: 49.3 % (ref 39.0–52.0)
Hemoglobin: 15.8 g/dL (ref 13.0–17.0)
Immature Granulocytes: 0 %
Lymphocytes Relative: 16 %
Lymphs Abs: 1.9 10*3/uL (ref 0.7–4.0)
MCH: 26.8 pg (ref 26.0–34.0)
MCHC: 32 g/dL (ref 30.0–36.0)
MCV: 83.6 fL (ref 80.0–100.0)
Monocytes Absolute: 1.2 10*3/uL — ABNORMAL HIGH (ref 0.1–1.0)
Monocytes Relative: 10 %
Neutro Abs: 8.7 10*3/uL — ABNORMAL HIGH (ref 1.7–7.7)
Neutrophils Relative %: 73 %
Platelets: 577 10*3/uL — ABNORMAL HIGH (ref 150–400)
RBC: 5.9 MIL/uL — ABNORMAL HIGH (ref 4.22–5.81)
RDW: 14.9 % (ref 11.5–15.5)
WBC: 11.9 10*3/uL — ABNORMAL HIGH (ref 4.0–10.5)
nRBC: 0 % (ref 0.0–0.2)

## 2023-04-15 LAB — TROPONIN I (HIGH SENSITIVITY): Troponin I (High Sensitivity): 4 ng/L (ref <18)

## 2023-04-15 LAB — MAGNESIUM: Magnesium: 1.7 mg/dL (ref 1.7–2.4)

## 2023-04-15 LAB — RESP PANEL BY RT-PCR (RSV, FLU A&B, COVID)  RVPGX2
Influenza A by PCR: NEGATIVE
Influenza B by PCR: NEGATIVE
Resp Syncytial Virus by PCR: NEGATIVE
SARS Coronavirus 2 by RT PCR: NEGATIVE

## 2023-04-15 MED ORDER — OXYCODONE-ACETAMINOPHEN 5-325 MG PO TABS
1.0000 | ORAL_TABLET | Freq: Once | ORAL | Status: AC
  Administered 2023-04-15: 1 via ORAL
  Filled 2023-04-15: qty 1
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
## 2023-04-15 NOTE — ED Notes (Signed)
Called [facility] to Transport patient back to home address

## 2023-04-15 NOTE — ED Notes (Signed)
Pt has used the bathroom on himself 2 more times, once at 21:30 and again at 22:30. Pt notified ED Tech that he has used it on himself again at 22:45. Pt has been taken to yellow zone each time to get him cleaned up and placed in clean brief. Pt has used it on himself a total of 4 times, pt states that this is unusual for him and he does not know what is going on.

## 2023-04-15 NOTE — Discharge Instructions (Addendum)
Mark Porter:  Thank you for allowing [place] to take care of you today.  We hope you begin feeling better soon. You were seen today for generalized weakness.   To-Do: Please follow-up with your primary doctor to schedule an appointment with a new primary care doctor within the next 2-3 days.  Please return to the Emergency Department or call 911 if you experience worsening weakness, chest pain, shortness of breath, severe pain, severe fever, altered mental status, or have any reason to think that you need emergency medical care.  Thank you again.  Hope you feel better soon.

## 2023-04-15 NOTE — ED Notes (Signed)
Pt notified ED Tech that he used the bathroom on himself. Pt was transported to an empty room in yellow zone and cleaned up and a brief was placed on the pt. Pt was moved back to hallway 16 and is resting comfortably.

## 2023-04-15 NOTE — ED Triage Notes (Signed)
Per [facility] pt coming from home c/o generalized weakness x 2 days. Reports diarrhea x 1 week.

## 2023-04-16 DIAGNOSIS — R531 Weakness: Secondary | ICD-10-CM | POA: Diagnosis not present

## 2023-05-06 ENCOUNTER — Other Ambulatory Visit: Payer: Self-pay

## 2023-05-06 ENCOUNTER — Emergency Department (HOSPITAL_COMMUNITY)
Admission: EM | Admit: 2023-05-06 | Discharge: 2023-05-06 | Disposition: A | Discharge status: Home / Self Care | Payer: Medicare HMO | Attending: Emergency Medicine | Admitting: Emergency Medicine

## 2023-05-06 ENCOUNTER — Emergency Department (HOSPITAL_COMMUNITY): Payer: Medicare HMO

## 2023-05-06 ENCOUNTER — Encounter (HOSPITAL_COMMUNITY): Payer: Self-pay

## 2023-05-06 DIAGNOSIS — B379 Candidiasis, unspecified: Secondary | ICD-10-CM | POA: Diagnosis not present

## 2023-05-06 DIAGNOSIS — R1030 Lower abdominal pain, unspecified: Secondary | ICD-10-CM | POA: Diagnosis present

## 2023-05-06 DIAGNOSIS — R339 Retention of urine, unspecified: Secondary | ICD-10-CM | POA: Insufficient documentation

## 2023-05-06 LAB — BASIC METABOLIC PANEL
Anion gap: 7 (ref 5–15)
BUN: 10 mg/dL (ref 6–20)
CO2: 21 mmol/L — ABNORMAL LOW (ref 22–32)
Calcium: 8.9 mg/dL (ref 8.9–10.3)
Chloride: 109 mmol/L (ref 98–111)
Creatinine, Ser: 0.64 mg/dL (ref 0.61–1.24)
GFR, Estimated: >60 mL/min (ref >60)
Glucose, Bld: 102 mg/dL — ABNORMAL HIGH (ref 70–99)
Potassium: 3.7 mmol/L (ref 3.5–5.1)
Sodium: 137 mmol/L (ref 135–145)

## 2023-05-06 LAB — URINALYSIS, W/ REFLEX TO CULTURE (INFECTION SUSPECTED)
Bilirubin Urine: NEGATIVE
Glucose, UA: NEGATIVE mg/dL
Ketones, ur: NEGATIVE mg/dL
Nitrite: NEGATIVE
Protein, ur: NEGATIVE mg/dL
RBC / HPF: >50 RBC/hpf (ref 0–5)
Specific Gravity, Urine: 1.012 (ref 1.005–1.030)
pH: 7 (ref 5.0–8.0)

## 2023-05-06 LAB — CBC
HCT: 44.7 % (ref 39.0–52.0)
Hemoglobin: 14.8 g/dL (ref 13.0–17.0)
MCH: 27.1 pg (ref 26.0–34.0)
MCHC: 33.1 g/dL (ref 30.0–36.0)
MCV: 81.7 fL (ref 80.0–100.0)
Platelets: 512 10*3/uL — ABNORMAL HIGH (ref 150–400)
RBC: 5.47 MIL/uL (ref 4.22–5.81)
RDW: 14.8 % (ref 11.5–15.5)
WBC: 9.7 10*3/uL (ref 4.0–10.5)
nRBC: 0 % (ref 0.0–0.2)

## 2023-05-06 MED ORDER — NYSTATIN 100000 UNIT/GM EX CREA
TOPICAL_CREAM | CUTANEOUS | 0 refills | Status: DC
  Filled 2023-05-06: qty 30, 20d supply, fill #0

## 2023-05-06 MED ORDER — KETOROLAC TROMETHAMINE 30 MG/ML IJ SOLN
30.0000 mg | Freq: Once | INTRAMUSCULAR | Status: AC
  Administered 2023-05-06: 30 mg via INTRAVENOUS
  Filled 2023-05-06: qty 1

## 2023-05-06 MED ORDER — NYSTATIN 100000 UNIT/GM EX POWD
Freq: Once | CUTANEOUS | Status: AC
  Filled 2023-05-06: qty 15

## 2023-05-06 MED ORDER — ACETAMINOPHEN 500 MG PO TABS
1000.0000 mg | ORAL_TABLET | Freq: Once | ORAL | Status: AC
  Administered 2023-05-06: 1000 mg via ORAL
  Filled 2023-05-06: qty 2

## 2023-05-06 MED ORDER — IOHEXOL 350 MG/ML SOLN
75.0000 mL | Freq: Once | INTRAVENOUS | Status: AC | PRN
  Administered 2023-05-06: 75 mL via INTRAVENOUS

## 2023-05-06 MED ORDER — MORPHINE SULFATE (PF) 2 MG/ML IV SOLN
2.0000 mg | Freq: Once | INTRAVENOUS | Status: AC
  Administered 2023-05-06: 2 mg via INTRAVENOUS
  Filled 2023-05-06: qty 1

## 2023-05-06 NOTE — Discharge Instructions (Addendum)
You have a yeast infection in your groin.  You can use nystatin cream 2 times per day for the next 1 week to help out with this.  You can take Tylenol and ibuprofen for pain.  We have left a Foley catheter in since you are unable to urinate.  You will need to call Dr. Dillard Essex office on Monday to set up an appointment.  In the meantime, please refer to the paperwork that you are sent home with about how to take care of your Foley catheter.

## 2023-05-06 NOTE — ED Notes (Signed)
EDP notified of bladder scan .    EDP wanted patient to urinate and bladder scan again. Patient stated "I can't  I've tried just put the catheter in"   EDP notified. Verbal order to place foley catheter.

## 2023-05-06 NOTE — ED Notes (Signed)
LABWORK SENT/ CULTURES OBTAINED AND WAITING FOR ORDERS.

## 2023-05-06 NOTE — ED Notes (Signed)
[facility] called, no eta 
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
## 2023-05-08 LAB — URINE CULTURE: Culture: >=100000 — AB

## 2023-05-09 ENCOUNTER — Other Ambulatory Visit (HOSPITAL_COMMUNITY): Payer: Self-pay

## 2023-05-09 ENCOUNTER — Other Ambulatory Visit: Payer: Self-pay

## 2023-05-09 ENCOUNTER — Telehealth (HOSPITAL_BASED_OUTPATIENT_CLINIC_OR_DEPARTMENT_OTHER): Payer: Self-pay | Admitting: *Deleted

## 2023-05-09 MED ORDER — NITROFURANTOIN MONOHYD MACRO 100 MG PO CAPS
100.0000 mg | ORAL_CAPSULE | Freq: Two times a day (BID) | ORAL | 0 refills | Status: DC
Start: 2023-05-09 — End: 2023-05-25
  Filled 2023-05-09: qty 14, 7d supply, fill #0

## 2023-05-09 NOTE — Progress Notes (Signed)
ED Antimicrobial Stewardship Positive Culture Follow Up   Mark Porter is an 57 y.o. male who presented to [facility] on 05/06/2023 with a chief complaint of  Chief Complaint  Patient presents with   Groin Pain   Groin Swelling    Recent Results (from the past 720 hour(s))  Resp panel by RT-PCR (RSV, Flu A&B, Covid) Anterior Nasal Swab     Status: None   Collection Time: 04/15/23  6:07 PM   Specimen: Anterior Nasal Swab  Result Value Ref Range Status   SARS Coronavirus 2 by RT PCR NEGATIVE NEGATIVE Final   Influenza A by PCR NEGATIVE NEGATIVE Final   Influenza B by PCR NEGATIVE NEGATIVE Final    Comment: (NOTE) The Xpert Xpress SARS-CoV-2/FLU/RSV plus assay is intended as an aid in the diagnosis of influenza from Nasopharyngeal swab specimens and should not be used as a sole basis for treatment. Nasal washings and aspirates are unacceptable for Xpert Xpress SARS-CoV-2/FLU/RSV testing.  Fact Sheet for Patients: BloggerCourse.com  Fact Sheet for Healthcare Providers: SeriousBroker.it  This test is not yet approved or cleared by the [place] FDA and has been authorized for detection and/or diagnosis of SARS-CoV-2 by FDA under an Emergency Use Authorization (EUA). This EUA will remain in effect (meaning this test can be used) for the duration of the COVID-19 declaration under Section 564(b)(1) of the Act, 21 U.S.C. section 360bbb-3(b)(1), unless the authorization is terminated or revoked.     Resp Syncytial Virus by PCR NEGATIVE NEGATIVE Final    Comment: (NOTE) Fact Sheet for Patients: BloggerCourse.com  Fact Sheet for Healthcare Providers: SeriousBroker.it  This test is not yet approved or cleared by the [place] FDA and has been authorized for detection and/or diagnosis of SARS-CoV-2 by FDA under an Emergency Use Authorization (EUA). This EUA will remain in  effect (meaning this test can be used) for the duration of the COVID-19 declaration under Section 564(b)(1) of the Act, 21 U.S.C. section 360bbb-3(b)(1), unless the authorization is terminated or revoked.  Performed at [facility], [address], [place], [place] [address]   Urine Culture     Status: Abnormal   Collection Time: 05/06/23  4:08 PM   Specimen: Urine, Random  Result Value Ref Range Status   Specimen Description URINE, RANDOM  Final   Special Requests   Final    NONE Reflexed from [id number] Performed at [facility], [address], [place], [place] [address]    Culture (A)  Final    >=100,000 COLONIES/mL METHICILLIN RESISTANT STAPHYLOCOCCUS AUREUS   Report Status 05/08/2023 FINAL  Final   Organism ID, Bacteria METHICILLIN RESISTANT STAPHYLOCOCCUS AUREUS (A)  Final      Susceptibility   Methicillin resistant staphylococcus aureus - MIC*    CIPROFLOXACIN >=8 RESISTANT Resistant     GENTAMICIN <=0.5 SENSITIVE Sensitive     NITROFURANTOIN <=16 SENSITIVE Sensitive     OXACILLIN >=4 RESISTANT Resistant     TETRACYCLINE <=1 SENSITIVE Sensitive     VANCOMYCIN <=0.5 SENSITIVE Sensitive     TRIMETH/SULFA <=10 SENSITIVE Sensitive     CLINDAMYCIN <=0.25 SENSITIVE Sensitive     RIFAMPIN <=0.5 SENSITIVE Sensitive     Inducible Clindamycin NEGATIVE Sensitive     LINEZOLID 2 SENSITIVE Sensitive     * >=100,000 COLONIES/mL METHICILLIN RESISTANT STAPHYLOCOCCUS AUREUS    []  Treated with , organism resistant to prescribed antimicrobial [x]  Patient discharged originally without antimicrobial agent and treatment is now indicated  New antibiotic  prescription: Macrobid  ED Provider: Riki Sheer, PA-C   Daylene Posey 05/09/2023, 7:29 AM Clinical Pharmacist Monday - Friday phone -  [phone] Saturday - Sunday phone - [phone]

## 2023-05-09 NOTE — Telephone Encounter (Signed)
Post ED Visit - Positive Culture Follow-up: Successful Patient Follow-Up  Culture assessed and recommendations reviewed by:  [x]  Daylene Posey, Pharm.D., BCPS AQ-ID []  Garvin Fila, Pharm.D., BCPS []  Georgina Pillion, Pharm.D., BCPS []  [place], [place].D., BCPS, AAHIVP []  Estella Husk, Pharm.D., BCPS, AAHIVP []  Lysle Pearl, PharmD, BCPS []  Phillips Climes, PharmD, BCPS []  Agapito Games, PharmD, BCPS []  Verlan Friends, PharmD  Positive urine culture  [x]  Patient discharged without antimicrobial prescription and treatment is now indicated []  Organism is resistant to prescribed ED discharge antimicrobial []  Patient with positive blood cultures  Changes discussed with ED provider: Riki Sheer PA New antibiotic prescription Macrobid 100 mg BID for 7 days  Called to [facility]. Patient stated he didn't have a ride to go pick up medication. He was instructed to call the pharmacy in 10 mins to change the prescription to mail order. Patient was provided the pharmacy phone number.  Contacted patient, date 05/09/23, time 1200   Mark Porter 05/09/2023, 11:54 AM

## 2023-05-10 ENCOUNTER — Emergency Department (HOSPITAL_COMMUNITY)
Admission: EM | Admit: 2023-05-10 | Discharge: 2023-05-10 | Disposition: A | Discharge status: Home / Self Care | Payer: Medicare HMO | Attending: Emergency Medicine | Admitting: Emergency Medicine

## 2023-05-10 ENCOUNTER — Other Ambulatory Visit: Payer: Self-pay

## 2023-05-10 ENCOUNTER — Encounter (HOSPITAL_COMMUNITY): Payer: Self-pay

## 2023-05-10 ENCOUNTER — Emergency Department (HOSPITAL_COMMUNITY): Payer: Medicare HMO

## 2023-05-10 DIAGNOSIS — R319 Hematuria, unspecified: Secondary | ICD-10-CM | POA: Diagnosis not present

## 2023-05-10 DIAGNOSIS — N5082 Scrotal pain: Secondary | ICD-10-CM | POA: Insufficient documentation

## 2023-05-10 MED ORDER — NITROFURANTOIN MONOHYD MACRO 100 MG PO CAPS
100.0000 mg | ORAL_CAPSULE | Freq: Once | ORAL | Status: AC
  Administered 2023-05-10: 100 mg via ORAL
  Filled 2023-05-10: qty 1

## 2023-05-10 MED ORDER — ACETAMINOPHEN 325 MG PO TABS
650.0000 mg | ORAL_TABLET | Freq: Once | ORAL | Status: AC
  Administered 2023-05-10: 650 mg via ORAL
  Filled 2023-05-10: qty 2

## 2023-05-10 MED ORDER — MORPHINE SULFATE (PF) 2 MG/ML IV SOLN
2.0000 mg | Freq: Once | INTRAVENOUS | Status: AC
  Administered 2023-05-10: 2 mg via INTRAMUSCULAR
  Filled 2023-05-10: qty 1

## 2023-05-10 NOTE — ED Triage Notes (Signed)
Pt coming in from home complaining of blood in urine. Pt complaining of testicular pain . Pt is wheelchair bound at baseline.    Bp 122 palp Hr92 Rr20 Spo295% on ra

## 2023-05-10 NOTE — ED Provider Notes (Signed)
[place] EMERGENCY DEPARTMENT AT [facility] Provider Note   CSN: [id number] Arrival date & time: 05/10/23  2841     History  Chief Complaint  Patient presents with   Hematuria    Mark Porter is a 57 y.o. male history of incomplete quadriplegia, opioid overdose since abuse, homeless presented with scrotal pain that began few days ago and he was in the ER after they placed a Foley catheter.  Patient denies any scrotal swelling but states if he touches both of his testicles he has pain there.  Patient denies any blood in the Foley bag although he told triage she was having bleeding with urination.  Patient denies any fevers, nausea vomiting, abdominal pain, skin color changes.  Patient states that they called in a prescription for his urine grew bacteria but he has not had a chance to pick it up as it was called in for today.  Home Medications Prior to Admission medications   Medication Sig Start Date End Date Taking? Authorizing Provider  baclofen (LIORESAL) 10 MG tablet Take 10 mg by mouth 3 (three) times daily. 05/13/20   [provider]  HYDROcodone-acetaminophen (NORCO) 5-325 MG tablet Take 1 tablet by mouth every 4 (four) hours as needed for moderate pain. 03/16/23   Horton, Clabe Seal, DO  morphine (MS CONTIN) 15 MG 12 hr tablet Take 15 mg by mouth daily. 02/02/23   [provider]  nitrofurantoin, macrocrystal-monohydrate, (MACROBID) 100 MG capsule Take 1 capsule (100 mg total) by mouth 2 (two) times daily. 05/09/23   Gareth Eagle, PA-C  nystatin cream (MYCOSTATIN) Apply to affected area 2 times daily 05/06/23   Anders Simmonds T, DO  oxyCODONE (ROXICODONE) 15 MG immediate release tablet Take 15 mg by mouth 4 (four) times daily. 11/16/18   [provider]  pregabalin (LYRICA) 25 MG capsule Take 25 mg by mouth 2 (two) times daily. 02/02/23   [provider]      Allergies    Carisoprodol    Review of Systems   Review of Systems   Genitourinary:  Positive for hematuria.    Physical Exam Updated Vital Signs BP 104/68 (BP Location: Right Arm)   Pulse 68   Temp 98.2 F (36.8 C) (Oral)   Resp 15   Ht 5\' 7"  (1.702 m)   Wt 77.1 kg   SpO2 95%   BMI 26.62 kg/m  Physical Exam Constitutional:      General: He is not in acute distress. Cardiovascular:     Rate and Rhythm: Normal rate and regular rhythm.     Pulses: Normal pulses.     Heart sounds: Normal heart sounds.  Pulmonary:     Effort: Pulmonary effort is normal. No respiratory distress.     Breath sounds: Normal breath sounds.  Abdominal:     Palpations: Abdomen is soft.     Tenderness: There is no abdominal tenderness. There is no guarding or rebound.  Genitourinary:    Comments: Chaperone: Baker Pierini, RN Tenderness to bilateral testicles however no scrotal swelling was noted, firmness, erythema, warmth, discharge, obvious abnormalities or trauma Perineum was with out abnormalities Musculoskeletal:        General: Normal range of motion.  Skin:    General: Skin is warm and dry.     Comments: No overlying skin color changes  Neurological:     Mental Status: He is alert.  Psychiatric:        Mood and Affect: Mood normal.  ED Results / Procedures / Treatments   Labs (all labs ordered are listed, but only abnormal results are displayed) Labs Reviewed - No data to display  EKG None  Radiology US SCROTUM W/DOPPLER  Result Date: 05/10/2023 CLINICAL DATA:  scrotal pain EXAM: SCROTAL ULTRASOUND DOPPLER ULTRASOUND OF THE TESTICLES TECHNIQUE: Complete ultrasound examination of the testicles, epididymis, and other scrotal structures was performed. Color and spectral Doppler ultrasound were also utilized to evaluate blood flow to the testicles. COMPARISON:  Scrotal ultrasound dated Dec 03, 2018. FINDINGS: Right testicle Measurements: 4.9 x 2.1 x 3.1 cm. No mass or microlithiasis visualized. Left testicle Measurements: 5.1 x 2.4 x 3.1 cm. No  mass or microlithiasis visualized. Right epididymis:  Normal in size and appearance. Left epididymis:  Normal in size and appearance. Hydrocele:  Small right-sided hydrocele. Varicocele:  None visualized. Pulsed Doppler interrogation of both testes demonstrates normal low resistance arterial and venous waveforms bilaterally. Other: There is a 5 mm right-sided extratesticular calcification within the scrotum, which may represent a scrotolith. IMPRESSION: 1. No acute sonographic abnormality. No evidence of testicular torsion. 2. A 5 mm extratesticular calcification in the scrotum is similar to the prior exam and may represent a scrotolith. Electronically Signed   By: Hart Robinsons M.D.   On: 05/10/2023 12:26    Procedures Procedures    Medications Ordered in ED Medications  morphine (PF) 2 MG/ML injection 2 mg (2 mg Intramuscular Given 05/10/23 1048)  acetaminophen (TYLENOL) tablet 650 mg (650 mg Oral Given 05/10/23 1219)  nitrofurantoin (macrocrystal-monohydrate) (MACROBID) capsule 100 mg (100 mg Oral Given 05/10/23 1302)    ED Course/ Medical Decision Making/ A&P                                 Medical Decision Making Amount and/or Complexity of Data Reviewed Radiology: ordered.  Risk Prescription drug management.   [name] 57 y.o. presented today for scrotal pain. Working DDx that I considered at this time includes, but not limited to, secondary to Foley catheter placement, epididymitis, torsion, trauma, Fournier's, UTI.  R/o DDx: epididymitis, torsion, trauma, Fournier's: These are considered less likely due to history of present illness, physical exam, labs/imaging findings  Review of prior external notes: 05/06/2023 ED  Unique Tests and My Interpretation:  Scrotal ultrasound: scrotolith   Social Determinants of Health: homeless, substance abuse  Discussion with Independent Historian: None  Discussion of Management of Tests: Christiane Ha, ED  pharmacist  Risk: Medium: prescription drug management  Risk Stratification Score: none  Staffed with Trifan, MD  Plan: On exam patient was in no acute distress stable vitals.  With a chaperone patient scrotal area was examined there was remarkable for mild tenderness to the testicles however scrotum did not appear edematous and did not have any concerning features.  Patient has had unremarkable physical exam.  Patient states that he thinks his pain is secondary to his Foley catheter and although the chief complaint is hematuria there is no blood in the Foley catheter bag and patient denied this with me.  Patient given pain meds and ultrasound was ordered.  Ultrasound was negative for any acute findings.  Patient repeatedly asked for the Foley catheter to be removed.  Spoke to the patient extensively about how if we remove the catheter and he is unable to urinate after being discharged that he will be back in the ER patient.  They capacity stated that that is what he  wanted and so we will remove the Foley catheter.  I spoke to the pharmacist as his urine culture did grow MRSA and the pharmacist stated that the Macrobid was sent to his pharmacy today and that he just needs to pick it up and so patient will give 1 dose of the Macrobid here before he leaves.  Encourage patient to pick up the Macrobid and follow-up with his primary care provider.  Patient was given return precautions. Patient stable for discharge at this time.  Patient verbalized understanding of plan.  This chart was dictated using voice recognition software.  Despite best efforts to proofread,  errors can occur which can change the documentation meaning.         Final Clinical Impression(s) / ED Diagnoses Final diagnoses:  Scrotal pain    Rx / DC Orders ED Discharge Orders     None         Remi Deter 05/10/23 1318    Terald Sleeper, MD 05/10/23 [phone]

## 2023-05-10 NOTE — Discharge Instructions (Signed)
Please follow-up with your primary care provider in regards recent ER visit.  Today your ultrasound was negative and your scrotal pain is most likely secondary to the Foley catheter placed.  At your request the Foley catheter was removed.  If you are unable to urinate after this please return to the ER.  Your antibiotics have also been sent to your pharmacy as well and so please take the rest of those.  You are given 1 dose of these before discharge.  If symptoms change or worsen please return to ER.

## 2023-05-19 ENCOUNTER — Encounter (HOSPITAL_COMMUNITY): Payer: Self-pay

## 2023-05-19 ENCOUNTER — Emergency Department (HOSPITAL_COMMUNITY)
Admission: EM | Admit: 2023-05-19 | Discharge: 2023-05-19 | Disposition: A | Discharge status: Home / Self Care | Payer: Medicare HMO | Attending: Emergency Medicine | Admitting: Emergency Medicine

## 2023-05-19 ENCOUNTER — Other Ambulatory Visit: Payer: Self-pay

## 2023-05-19 ENCOUNTER — Other Ambulatory Visit (HOSPITAL_COMMUNITY): Payer: Self-pay

## 2023-05-19 DIAGNOSIS — N3000 Acute cystitis without hematuria: Secondary | ICD-10-CM | POA: Insufficient documentation

## 2023-05-19 DIAGNOSIS — R339 Retention of urine, unspecified: Secondary | ICD-10-CM

## 2023-05-19 LAB — URINALYSIS, ROUTINE W REFLEX MICROSCOPIC
Bilirubin Urine: NEGATIVE
Glucose, UA: NEGATIVE mg/dL
Ketones, ur: NEGATIVE mg/dL
Nitrite: NEGATIVE
Protein, ur: NEGATIVE mg/dL
Specific Gravity, Urine: 1.015 (ref 1.005–1.030)
pH: 6 (ref 5.0–8.0)

## 2023-05-19 LAB — CBC WITH DIFFERENTIAL/PLATELET
Abs Immature Granulocytes: 0.08 10*3/uL — ABNORMAL HIGH (ref 0.00–0.07)
Basophils Absolute: 0.1 10*3/uL (ref 0.0–0.1)
Basophils Relative: 1 %
Eosinophils Absolute: 0.1 10*3/uL (ref 0.0–0.5)
Eosinophils Relative: 1 %
HCT: 41.8 % (ref 39.0–52.0)
Hemoglobin: 14 g/dL (ref 13.0–17.0)
Immature Granulocytes: 1 %
Lymphocytes Relative: 15 %
Lymphs Abs: 2 10*3/uL (ref 0.7–4.0)
MCH: 27.6 pg (ref 26.0–34.0)
MCHC: 33.5 g/dL (ref 30.0–36.0)
MCV: 82.3 fL (ref 80.0–100.0)
Monocytes Absolute: 1.1 10*3/uL — ABNORMAL HIGH (ref 0.1–1.0)
Monocytes Relative: 8 %
Neutro Abs: 10.4 10*3/uL — ABNORMAL HIGH (ref 1.7–7.7)
Neutrophils Relative %: 74 %
Platelets: 572 10*3/uL — ABNORMAL HIGH (ref 150–400)
RBC: 5.08 MIL/uL (ref 4.22–5.81)
RDW: 15.6 % — ABNORMAL HIGH (ref 11.5–15.5)
WBC: 13.7 10*3/uL — ABNORMAL HIGH (ref 4.0–10.5)
nRBC: 0 % (ref 0.0–0.2)

## 2023-05-19 LAB — BASIC METABOLIC PANEL
Anion gap: 11 (ref 5–15)
BUN: 10 mg/dL (ref 6–20)
CO2: 19 mmol/L — ABNORMAL LOW (ref 22–32)
Calcium: 9 mg/dL (ref 8.9–10.3)
Chloride: 107 mmol/L (ref 98–111)
Creatinine, Ser: 0.85 mg/dL (ref 0.61–1.24)
GFR, Estimated: >60 mL/min (ref >60)
Glucose, Bld: 114 mg/dL — ABNORMAL HIGH (ref 70–99)
Potassium: 3.1 mmol/L — ABNORMAL LOW (ref 3.5–5.1)
Sodium: 137 mmol/L (ref 135–145)

## 2023-05-19 MED ORDER — CLINDAMYCIN HCL 300 MG PO CAPS
300.0000 mg | ORAL_CAPSULE | Freq: Three times a day (TID) | ORAL | 0 refills | Status: DC
  Filled 2023-05-19 – 2023-05-24 (×3): qty 21, 7d supply, fill #0

## 2023-05-19 MED ORDER — FLUCONAZOLE 150 MG PO TABS
150.0000 mg | ORAL_TABLET | Freq: Once | ORAL | 0 refills | Status: DC
  Filled 2023-05-19 – 2023-05-24 (×3): qty 1, 1d supply, fill #0

## 2023-05-19 MED ORDER — MORPHINE SULFATE (PF) 4 MG/ML IV SOLN
4.0000 mg | Freq: Once | INTRAVENOUS | Status: AC
  Administered 2023-05-19: 4 mg via INTRAVENOUS
  Filled 2023-05-19: qty 1

## 2023-05-19 MED ORDER — ONDANSETRON HCL 4 MG/2ML IJ SOLN
4.0000 mg | Freq: Once | INTRAMUSCULAR | Status: AC
  Administered 2023-05-19: 4 mg via INTRAVENOUS
  Filled 2023-05-19: qty 2

## 2023-05-19 MED ORDER — LIDOCAINE HCL URETHRAL/MUCOSAL 2 % EX GEL: 1.0000 | Freq: Once | CUTANEOUS | Status: DC

## 2023-05-19 NOTE — ED Provider Notes (Signed)
[facility]-EMERGENCY DEPT [facility] Emergency Department Provider Note MRN:  [id number]  Arrival date & time: 05/19/23     Chief Complaint   Urinary Retention and Penis Pain   History of Present Illness   Mark Porter is a 57 y.o. year-old male presents to the ED with chief complaint of urinary retention.  States he hasn't urinated all day.  History of the same.  Recently had foley catheter.  Hx of frequent UTIs, incomplete quadriplegia, abdominal hernias, substance abuse.  Patient states that he has been on antibiotics all week.  Has also been using nystatin powder to treat yeast.  History provided by patient.   Review of Systems  Pertinent positive and negative review of systems noted in HPI.    Physical Exam   Vitals:   05/19/23 0155  BP: 134/83  Pulse: 98  Resp: (!) 30  Temp: 97.7 F (36.5 C)  SpO2: 97%    CONSTITUTIONAL:  uncomfortable-appearing, writhing and moaning NEURO:  Alert and oriented x 3, CN 3-12 grossly intact EYES:  eyes equal and reactive ENT/NECK:  Supple, no stridor  CARDIO:  normal rate, regular rhythm, appears well-perfused  PULM:  No respiratory distress, CTAB GI/GU:  non-distended, abdominal hernia, suprapubic tenderness, erythema of the penis (shaft and glans), erythema of the scrotum, non tender.  Appears more like candidiasis  MSK/SPINE:  No gross deformities, no edema, moves all extremities  SKIN:  no rash, atraumatic   *Additional and/or pertinent findings included in MDM below  Diagnostic and Interventional Summary    EKG Interpretation Date/Time:    Ventricular Rate:    PR Interval:    QRS Duration:    QT Interval:    QTC Calculation:   R Axis:      Text Interpretation:         Labs Reviewed  CBC WITH DIFFERENTIAL/PLATELET - Abnormal; Notable for the following components:      Result Value   WBC 13.7 (*)    RDW 15.6 (*)    Platelets 572 (*)    Neutro Abs 10.4 (*)    Monocytes Absolute 1.1 (*)    Abs Immature  Granulocytes 0.08 (*)    All other components within normal limits  BASIC METABOLIC PANEL - Abnormal; Notable for the following components:   Potassium 3.1 (*)    CO2 19 (*)    Glucose, Bld 114 (*)    All other components within normal limits  URINALYSIS, ROUTINE W REFLEX MICROSCOPIC - Abnormal; Notable for the following components:   Color, Urine AMBER (*)    Hgb urine dipstick SMALL (*)    Leukocytes,Ua MODERATE (*)    Bacteria, UA RARE (*)    All other components within normal limits    No orders to display    Medications  lidocaine (XYLOCAINE) 2 % jelly 1 Application (1 Application Topical Not Given 05/19/23 0207)  morphine (PF) 4 MG/ML injection 4 mg (4 mg Intravenous Given 05/19/23 0231)  ondansetron (ZOFRAN) injection 4 mg (4 mg Intravenous Given 05/19/23 0230)     Procedures  /  Critical Care Procedures  ED Course and Medical Decision Making  I have reviewed the triage vital signs, the nursing notes, and pertinent available records from the EMR.  Social Determinants Affecting Complexity of Care: Patient has no clinically significant social determinants affecting this chief complaint..   ED Course:    Medical Decision Making    Recent urine culture was positive for MRSA, but sensitive to Macrobid, which patient  has been using.  Also sensitive to clinda, which might work a little better given the complex features and now having a catheter.  Will also treat yeast with diflucan.  I considered admitting the patient for IV, but patient requests to be discharged home.    Will have him follow-up with PCP and urology.    Amount and/or Complexity of Data Reviewed Labs: ordered.  Risk Prescription drug management.         Consultants: No consultations were needed in caring for this patient.   Treatment and Plan: I considered admission due to patient's initial presentation, but after considering the examination and diagnostic results, patient will not require  admission and can be discharged with outpatient follow-up.  Patient discussed with attending physician, Dr. Pilar Plate, who agrees with switching to clinda.  Final Clinical Impressions(s) / ED Diagnoses  No diagnosis found.  ED Discharge Orders     None         Discharge Instructions Discussed with and Provided to Patient:   Discharge Instructions   None      Roxy Horseman, PA-C 05/19/23 4098    Sabas Sous, MD 05/19/23 [phone]

## 2023-05-19 NOTE — ED Triage Notes (Signed)
Pt BIB [facility] from a halfway house. Pt wheelchair bound, complains of penis pain and urinary retention for the past day. Pt reports being seen for urinary retention recently and his bladder was drained. CBG 119 with EMS. EMS did not note any trauma or bleeding from the penis. Hx of opiate abuse and chronic UTIs.

## 2023-05-20 LAB — URINE CULTURE: Culture: NO GROWTH

## 2023-05-24 ENCOUNTER — Other Ambulatory Visit (HOSPITAL_COMMUNITY): Payer: Self-pay

## 2023-05-25 ENCOUNTER — Other Ambulatory Visit: Payer: Self-pay

## 2023-05-25 ENCOUNTER — Inpatient Hospital Stay (HOSPITAL_COMMUNITY)
Admission: RE | Admit: 2023-05-25 | Discharge: 2023-05-31 | DRG: 713 | Disposition: A | Discharge status: Home / Self Care | Payer: Medicare HMO | Attending: Family Medicine | Admitting: Family Medicine

## 2023-05-25 ENCOUNTER — Emergency Department (HOSPITAL_COMMUNITY): Payer: Medicare HMO

## 2023-05-25 DIAGNOSIS — N410 Acute prostatitis: Secondary | ICD-10-CM

## 2023-05-25 DIAGNOSIS — F4310 Post-traumatic stress disorder, unspecified: Secondary | ICD-10-CM | POA: Diagnosis present

## 2023-05-25 DIAGNOSIS — G8929 Other chronic pain: Secondary | ICD-10-CM

## 2023-05-25 DIAGNOSIS — N412 Abscess of prostate: Secondary | ICD-10-CM | POA: Diagnosis not present

## 2023-05-25 DIAGNOSIS — Z888 Allergy status to other drugs, medicaments and biological substances status: Secondary | ICD-10-CM

## 2023-05-25 DIAGNOSIS — D75839 Thrombocytosis, unspecified: Secondary | ICD-10-CM | POA: Diagnosis present

## 2023-05-25 DIAGNOSIS — I10 Essential (primary) hypertension: Secondary | ICD-10-CM | POA: Diagnosis present

## 2023-05-25 DIAGNOSIS — F0631 Mood disorder due to known physiological condition with depressive features: Secondary | ICD-10-CM | POA: Diagnosis not present

## 2023-05-25 DIAGNOSIS — G8254 Quadriplegia, C5-C7 incomplete: Secondary | ICD-10-CM | POA: Diagnosis present

## 2023-05-25 DIAGNOSIS — Z9081 Acquired absence of spleen: Secondary | ICD-10-CM

## 2023-05-25 DIAGNOSIS — G4700 Insomnia, unspecified: Secondary | ICD-10-CM | POA: Diagnosis present

## 2023-05-25 DIAGNOSIS — F1210 Cannabis abuse, uncomplicated: Secondary | ICD-10-CM | POA: Diagnosis present

## 2023-05-25 DIAGNOSIS — B9562 Methicillin resistant Staphylococcus aureus infection as the cause of diseases classified elsewhere: Secondary | ICD-10-CM | POA: Diagnosis present

## 2023-05-25 DIAGNOSIS — F29 Unspecified psychosis not due to a substance or known physiological condition: Secondary | ICD-10-CM | POA: Diagnosis not present

## 2023-05-25 DIAGNOSIS — D291 Benign neoplasm of prostate: Secondary | ICD-10-CM | POA: Diagnosis present

## 2023-05-25 DIAGNOSIS — F1410 Cocaine abuse, uncomplicated: Secondary | ICD-10-CM | POA: Diagnosis present

## 2023-05-25 DIAGNOSIS — Z87440 Personal history of urinary (tract) infections: Secondary | ICD-10-CM

## 2023-05-25 DIAGNOSIS — R1084 Generalized abdominal pain: Secondary | ICD-10-CM

## 2023-05-25 DIAGNOSIS — F419 Anxiety disorder, unspecified: Secondary | ICD-10-CM | POA: Diagnosis present

## 2023-05-25 DIAGNOSIS — Z79891 Long term (current) use of opiate analgesic: Secondary | ICD-10-CM

## 2023-05-25 DIAGNOSIS — F329 Major depressive disorder, single episode, unspecified: Secondary | ICD-10-CM | POA: Diagnosis present

## 2023-05-25 DIAGNOSIS — F1910 Other psychoactive substance abuse, uncomplicated: Secondary | ICD-10-CM | POA: Diagnosis present

## 2023-05-25 DIAGNOSIS — R338 Other retention of urine: Secondary | ICD-10-CM

## 2023-05-25 DIAGNOSIS — N319 Neuromuscular dysfunction of bladder, unspecified: Secondary | ICD-10-CM | POA: Diagnosis present

## 2023-05-25 DIAGNOSIS — Z5982 Transportation insecurity: Secondary | ICD-10-CM

## 2023-05-25 DIAGNOSIS — N139 Obstructive and reflux uropathy, unspecified: Secondary | ICD-10-CM | POA: Diagnosis present

## 2023-05-25 DIAGNOSIS — N390 Urinary tract infection, site not specified: Secondary | ICD-10-CM | POA: Diagnosis present

## 2023-05-25 DIAGNOSIS — R45851 Suicidal ideations: Principal | ICD-10-CM | POA: Diagnosis present

## 2023-05-25 DIAGNOSIS — Z5941 Food insecurity: Secondary | ICD-10-CM

## 2023-05-25 DIAGNOSIS — Z79899 Other long term (current) drug therapy: Secondary | ICD-10-CM

## 2023-05-25 DIAGNOSIS — G894 Chronic pain syndrome: Secondary | ICD-10-CM | POA: Diagnosis present

## 2023-05-25 LAB — COMPREHENSIVE METABOLIC PANEL
ALT: 47 U/L — ABNORMAL HIGH (ref 0–44)
AST: 45 U/L — ABNORMAL HIGH (ref 15–41)
Albumin: 3.5 g/dL (ref 3.5–5.0)
Alkaline Phosphatase: 72 U/L (ref 38–126)
Anion gap: 9 (ref 5–15)
BUN: 14 mg/dL (ref 6–20)
CO2: 21 mmol/L — ABNORMAL LOW (ref 22–32)
Calcium: 9.1 mg/dL (ref 8.9–10.3)
Chloride: 107 mmol/L (ref 98–111)
Creatinine, Ser: 0.85 mg/dL (ref 0.61–1.24)
GFR, Estimated: >60 mL/min (ref >60)
Glucose, Bld: 100 mg/dL — ABNORMAL HIGH (ref 70–99)
Potassium: 3.5 mmol/L (ref 3.5–5.1)
Sodium: 137 mmol/L (ref 135–145)
Total Bilirubin: 0.8 mg/dL (ref <1.2)
Total Protein: 7.8 g/dL (ref 6.5–8.1)

## 2023-05-25 LAB — CBC WITH DIFFERENTIAL/PLATELET
Abs Immature Granulocytes: 0.03 10*3/uL (ref 0.00–0.07)
Basophils Absolute: 0.1 10*3/uL (ref 0.0–0.1)
Basophils Relative: 1 %
Eosinophils Absolute: 0.2 10*3/uL (ref 0.0–0.5)
Eosinophils Relative: 2 %
HCT: 42.4 % (ref 39.0–52.0)
Hemoglobin: 14 g/dL (ref 13.0–17.0)
Immature Granulocytes: 0 %
Lymphocytes Relative: 29 %
Lymphs Abs: 3.1 10*3/uL (ref 0.7–4.0)
MCH: 27.7 pg (ref 26.0–34.0)
MCHC: 33 g/dL (ref 30.0–36.0)
MCV: 83.8 fL (ref 80.0–100.0)
Monocytes Absolute: 1.3 10*3/uL — ABNORMAL HIGH (ref 0.1–1.0)
Monocytes Relative: 12 %
Neutro Abs: 6.1 10*3/uL (ref 1.7–7.7)
Neutrophils Relative %: 56 %
Platelets: 565 10*3/uL — ABNORMAL HIGH (ref 150–400)
RBC: 5.06 MIL/uL (ref 4.22–5.81)
RDW: 15.1 % (ref 11.5–15.5)
WBC: 10.9 10*3/uL — ABNORMAL HIGH (ref 4.0–10.5)
nRBC: 0 % (ref 0.0–0.2)

## 2023-05-25 LAB — RAPID URINE DRUG SCREEN, HOSP PERFORMED
Amphetamines: NOT DETECTED
Barbiturates: NOT DETECTED
Benzodiazepines: NOT DETECTED
Cocaine: POSITIVE — AB
Opiates: NOT DETECTED
Tetrahydrocannabinol: POSITIVE — AB

## 2023-05-25 LAB — ACETAMINOPHEN LEVEL: Acetaminophen (Tylenol), Serum: <10 ug/mL — ABNORMAL LOW (ref 10–30)

## 2023-05-25 LAB — SALICYLATE LEVEL: Salicylate Lvl: <7 mg/dL — ABNORMAL LOW (ref 7.0–30.0)

## 2023-05-25 LAB — ETHANOL: Alcohol, Ethyl (B): <10 mg/dL (ref <10)

## 2023-05-25 MED ORDER — CLINDAMYCIN HCL 150 MG PO CAPS
300.0000 mg | ORAL_CAPSULE | Freq: Three times a day (TID) | ORAL | Status: DC
  Administered 2023-05-25 (×2): 300 mg via ORAL
  Filled 2023-05-25 (×2): qty 2

## 2023-05-25 MED ORDER — BACLOFEN 10 MG PO TABS
10.0000 mg | ORAL_TABLET | Freq: Three times a day (TID) | ORAL | Status: DC
  Administered 2023-05-25 – 2023-05-31 (×18): 10 mg via ORAL
  Filled 2023-05-25 (×19): qty 1

## 2023-05-25 MED ORDER — MORPHINE SULFATE (PF) 4 MG/ML IV SOLN
4.0000 mg | Freq: Once | INTRAVENOUS | Status: AC
  Administered 2023-05-25: 4 mg via INTRAVENOUS
  Filled 2023-05-25: qty 1

## 2023-05-25 MED ORDER — ACETAMINOPHEN 325 MG PO TABS: 650.0000 mg | ORAL_TABLET | Freq: Once | ORAL | Status: DC

## 2023-05-25 MED ORDER — FLUCONAZOLE 150 MG PO TABS
150.0000 mg | ORAL_TABLET | Freq: Once | ORAL | Status: AC
  Administered 2023-05-25: 150 mg via ORAL
  Filled 2023-05-25: qty 1

## 2023-05-25 MED ORDER — IBUPROFEN 400 MG PO TABS
600.0000 mg | ORAL_TABLET | Freq: Three times a day (TID) | ORAL | Status: DC | PRN
  Administered 2023-05-25: 600 mg via ORAL
  Filled 2023-05-25: qty 1

## 2023-05-25 MED ORDER — IOPAMIDOL (ISOVUE-370) INJECTION 76%
75.0000 mL | Freq: Once | INTRAVENOUS | Status: AC | PRN
  Administered 2023-05-25: 75 mL via INTRAVENOUS
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
## 2023-05-25 NOTE — ED Notes (Signed)
Staffing office states they do not have any sitters available at this time

## 2023-05-25 NOTE — ED Triage Notes (Signed)
Pt had foley catheter replaced two days ago and since then he has been having left flank and abd pain.

## 2023-05-25 NOTE — ED Notes (Signed)
Patient transported to X-ray 

## 2023-05-25 NOTE — ED Notes (Signed)
Patient transported to CT 

## 2023-05-25 NOTE — BH Assessment (Signed)
TTS consult has been deferred to IRIS. IRIS coordinator will reach out in established secure chat with provider name and assessment time.
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
## 2023-05-25 NOTE — ED Notes (Signed)
IV team at bedside 

## 2023-05-25 NOTE — ED Notes (Signed)
Pt changed into purple scrubs 

## 2023-05-25 NOTE — ED Provider Notes (Signed)
[place] EMERGENCY DEPARTMENT AT [facility] Provider Note   CSN: [id number] Arrival date & time: 05/25/23  9528     History  Chief Complaint  Patient presents with   Suicidal   Flank Pain    Left flank    Mark Porter is a 57 y.o. male.  HPI Patient with multiple medical's including multiple prior surgeries in his abdomen, incomplete quadriplegia, newly placed Foley catheter, chronic pain presents with suicidal ideation.  He notes that after he had a discriminant with prior physician who was controlling his chronic pain, he has had persistent/worsening pain throughout his abdomen, as well as in his left upper chest.  In part due to this worsening pain he is feeling suicidal.  To me he has no discrete plan, though he offered a potential suicide plan to triage staff. Seemingly has no fever, no vomiting, though he perseverates on his pain, and his other answers are incomplete.    Home Medications Prior to Admission medications   Medication Sig Start Date End Date Taking? Authorizing Provider  baclofen (LIORESAL) 10 MG tablet Take 10 mg by mouth 3 (three) times daily. 05/13/20  Yes [provider]  clindamycin (CLEOCIN) 300 MG capsule Take 1 capsule (300 mg total) by mouth 3 (three) times daily. 05/19/23   Roxy Horseman, PA-C  fluconazole (DIFLUCAN) 150 MG tablet Take 1 tablet on day 1, then 1 tablet after finishing the antibiotic 05/19/23 05/26/23  Roxy Horseman, PA-C      Allergies    Carisoprodol    Review of Systems   Review of Systems  Physical Exam Updated Vital Signs BP 105/75 (BP Location: Left Arm)   Pulse 74   Temp 98.1 F (36.7 C) (Oral)   Resp 19   Ht 5\' 7"  (1.702 m)   Wt 72.6 kg   SpO2 97%   BMI 25.07 kg/m  Physical Exam Constitutional:      General: He is not in acute distress. Cardiovascular:     Rate and Rhythm: Normal rate and regular rhythm.     Pulses: Normal pulses.     Heart sounds: Normal heart sounds.  Pulmonary:      Effort: Pulmonary effort is normal. No respiratory distress.     Breath sounds: Normal breath sounds.  Abdominal:     Palpations: Abdomen is soft.     Tenderness: There is abdominal tenderness. There is guarding. There is no rebound.     Comments: Multiple surgical scars throughout the abdomen, surgical hernias appreciable.  Genitourinary:    Comments: Foley catheter with collection bag with 200 mL yellow liquid Musculoskeletal:        General: Normal range of motion.  Skin:    General: Skin is warm and dry.     Comments: No overlying skin color changes  Neurological:     Mental Status: He is alert.     Comments: Quadriplegia, patient denies changes  Psychiatric:        Mood and Affect: Mood normal.        Thought Content: Thought content includes suicidal ideation.     ED Results / Procedures / Treatments   Labs (all labs ordered are listed, but only abnormal results are displayed) Labs Reviewed  COMPREHENSIVE METABOLIC PANEL - Abnormal; Notable for the following components:      Result Value   CO2 21 (*)    Glucose, Bld 100 (*)    AST 45 (*)    ALT 47 (*)  All other components within normal limits  RAPID URINE DRUG SCREEN, HOSP PERFORMED - Abnormal; Notable for the following components:   Cocaine POSITIVE (*)    Tetrahydrocannabinol POSITIVE (*)    All other components within normal limits  CBC WITH DIFFERENTIAL/PLATELET - Abnormal; Notable for the following components:   WBC 10.9 (*)    Platelets 565 (*)    Monocytes Absolute 1.3 (*)    All other components within normal limits  SALICYLATE LEVEL - Abnormal; Notable for the following components:   Salicylate Lvl <7.0 (*)    All other components within normal limits  ACETAMINOPHEN LEVEL - Abnormal; Notable for the following components:   Acetaminophen (Tylenol), Serum <10 (*)    All other components within normal limits  ETHANOL  URINALYSIS, ROUTINE W REFLEX MICROSCOPIC    EKG EKG  Interpretation Date/Time:  Wednesday May 25 2023 07:38:47 EST Ventricular Rate:  70 PR Interval:  132 QRS Duration:  96 QT Interval:  420 QTC Calculation: 453 R Axis:   -5  Text Interpretation: Normal sinus rhythm Possible Left atrial enlargement Incomplete right bundle branch block Nonspecific T wave abnormality Abnormal ECG When compared with ECG of 06-May-2023 15:13, PREVIOUS ECG IS PRESENT Confirmed by Gerhard Munch [phone]) on 05/25/2023 8:27:59 AM  Radiology CT ABDOMEN PELVIS W CONTRAST  Result Date: 05/25/2023 CLINICAL DATA:  Abdominal pain, acute, nonlocalized mult prior surg, now w diffuse pain, anorexia. EXAM: CT ABDOMEN AND PELVIS WITH CONTRAST TECHNIQUE: Multidetector CT imaging of the abdomen and pelvis was performed using the standard protocol following bolus administration of intravenous contrast. RADIATION DOSE REDUCTION: This exam was performed according to the departmental dose-optimization program which includes automated exposure control, adjustment of the mA and/or kV according to patient size and/or use of iterative reconstruction technique. CONTRAST:  75mL ISOVUE-370 IOPAMIDOL (ISOVUE-370) INJECTION 76% COMPARISON:  CT scan abdomen and pelvis from 05/06/2023. FINDINGS: Lower chest: There are subpleural atelectatic changes in the visualized lung bases. No overt consolidation. No pleural effusion. The heart is normal in size. No pericardial effusion. Hepatobiliary: The liver is normal in size. Non-cirrhotic configuration. No suspicious mass. These is mild diffuse hepatic steatosis. No intrahepatic or extrahepatic bile duct dilation. No calcified gallstones. Normal gallbladder wall thickness. No pericholecystic inflammatory changes. Pancreas: Unremarkable. No pancreatic ductal dilatation or surrounding inflammatory changes. 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 Adrenals/Urinary Tract: Adrenal glands are unremarkable. No  suspicious renal mass. No hydronephrosis. No renal or ureteric calculi. Urinary bladder is decompressed secondary to Foley catheter. Stomach/Bowel: There is mild circumferential thickening of the lower thoracic esophagus, which is most likely seen in the settings of chronic gastroesophageal reflux disease versus esophagitis. No disproportionate dilation of the small or large bowel loops. No evidence of abnormal bowel wall thickening or inflammatory changes. The appendix is unremarkable. Vascular/Lymphatic: No ascites or pneumoperitoneum. No abdominal or pelvic lymphadenopathy, by size criteria. No aneurysmal dilation of the major abdominal arteries. There are mild peripheral atherosclerotic vascular calcifications of the aorta and its major branches. Reproductive: Mildly enlarged prostate gland. There are several hypoattenuating foci in the prostate gland with largest measuring up to 1.4 x 2.5 cm which exhibits incomplete peripheral hyperattenuating wall. Differential diagnosis includes prostatic abscess versus prostatic cysts. Correlate clinically and with urinalysis. Other: There are several midline supraumbilical fat containing ventral hernias. No herniation of bowel loop. There is also a small fat containing left inguinal hernia. The soft tissues and abdominal wall are otherwise unremarkable. Musculoskeletal: No suspicious osseous  lesions. There are mild - moderate multilevel degenerative changes in the visualized spine. 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 There is mild anterior wedging deformity of T12 vertebral body which also exhibits superior endplate Schmorl's node. No significant interval change. IMPRESSION: *There are several hypoattenuating foci in the prostate, as described above, which may represent prostatic abscess versus prostatic cysts. Correlate clinically and with urinalysis. *Multiple other nonacute observations, as described above. Electronically Signed   By: Jules Schick M.D.   On:  05/25/2023 15:27   DG Chest 2 View  Result Date: 05/25/2023 CLINICAL DATA:  L axilla pain EXAM: CHEST - 2 VIEW COMPARISON:  CXR 04/15/23 FINDINGS: No pleural effusion. No pneumothorax. Unchanged cardiac and mediastinal contours. Interval improvement in previously noted hazy opacity in the retrocardiac region. No radiographically apparent displaced rib fractures. Visualized upper abdomen is unremarkable. Vertebral body heights are maintained. IMPRESSION: Interval improvement in previously noted hazy opacity in the retrocardiac region. No radiographically apparent displaced rib fractures. Electronically Signed   By: Lorenza Cambridge M.D.   On: 05/25/2023 09:09    Procedures Procedures    Medications Ordered in ED Medications  morphine (PF) 4 MG/ML injection 4 mg (4 mg Intravenous Given 05/25/23 1015)  iopamidol (ISOVUE-370) 76 % injection 75 mL (75 mLs Intravenous Contrast Given 05/25/23 1223)  morphine (PF) 4 MG/ML injection 4 mg (4 mg Intravenous Given 05/25/23 1544)    ED Course/ Medical Decision Making/ A&P Clinical Course as of 05/25/23 1556  Wed May 25, 2023  1311 DG Chest 2 View [CG]    Clinical Course User Index [CG] Al [place], [place]                                 Medical Decision Making Adult male with quadriplegia, chronic pain, now presents with worsening pain in his left lateral axilla and abdomen, as well as new suicidal ideation.  Patient is awake and alert, vitals notable for mild hypertension, likely consistent with the patient's prior, but with pain, his history of multiple interventions, imaging ordered, labs sent.  Anticipated psych eval once medical issues have been evaluated, stabilized. Pulse ox 99% room air normal   Amount and/or Complexity of Data Reviewed External Data Reviewed: notes.    Details: 7 prior ED visits in the past 6 months including several recently including placement of Foley catheter, evaluation of his scrotum, noted to be erythematous  previously. Labs: ordered. Decision-making details documented in ED Course. Radiology: ordered and independent interpretation performed. Decision-making details documented in ED Course. ECG/medicine tests: ordered and independent interpretation performed. Decision-making details documented in ED Course.  Risk Prescription drug management. Decision regarding hospitalization. Diagnosis or treatment significantly limited by social determinants of health.   3:56 PM Patient remains in no distress, awake, alert, requesting pain medicine. Labs essentially unremarkable, urinalysis positive for cocaine, THC.  Patient's CT suggests possible prostatic cysts versus abscess, the patient is afebrile, not hypotensive nor tachycardic, no evidence of bacteremia or sepsis.  Urinalysis is pending, this is safe for continued following as the patient is now medically clear for behavioral health evaluation given his suicidal ideation.  Patient is here voluntarily.        Final Clinical Impression(s) / ED Diagnoses Final diagnoses:  Suicidal ideation  Generalized abdominal pain     Gerhard Munch, MD 05/25/23 1557
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
## 2023-05-25 NOTE — ED Notes (Signed)
RN notified MD about pt requesting pain medicine.

## 2023-05-25 NOTE — ED Notes (Signed)
TTS in process 

## 2023-05-25 NOTE — ED Notes (Signed)
Patient requesting more pain medications.  MD aware and new order placed. Patient offered Tylenol and patient refused.  States "that won't do anything for the pain.  That is why I came here.  This is the whole reason I wanted to kill myself.  I didn't want to be in pain anymore."  Patient upset and cussing at this time

## 2023-05-25 NOTE — ED Notes (Signed)
Patient returned from X-ray 

## 2023-05-26 ENCOUNTER — Inpatient Hospital Stay (HOSPITAL_COMMUNITY): Payer: Medicare HMO | Admitting: Certified Registered Nurse Anesthetist

## 2023-05-26 ENCOUNTER — Encounter (HOSPITAL_COMMUNITY)
Admission: RE | Disposition: A | Discharge status: Home / Self Care | Payer: Self-pay | Source: Home / Self Care | Attending: Internal Medicine

## 2023-05-26 ENCOUNTER — Encounter (HOSPITAL_COMMUNITY): Payer: Self-pay | Admitting: Family Medicine

## 2023-05-26 DIAGNOSIS — G894 Chronic pain syndrome: Secondary | ICD-10-CM | POA: Diagnosis present

## 2023-05-26 DIAGNOSIS — D75839 Thrombocytosis, unspecified: Secondary | ICD-10-CM | POA: Diagnosis present

## 2023-05-26 DIAGNOSIS — N412 Abscess of prostate: Secondary | ICD-10-CM

## 2023-05-26 DIAGNOSIS — N139 Obstructive and reflux uropathy, unspecified: Secondary | ICD-10-CM | POA: Diagnosis present

## 2023-05-26 DIAGNOSIS — F329 Major depressive disorder, single episode, unspecified: Secondary | ICD-10-CM | POA: Diagnosis present

## 2023-05-26 DIAGNOSIS — F29 Unspecified psychosis not due to a substance or known physiological condition: Secondary | ICD-10-CM | POA: Diagnosis present

## 2023-05-26 DIAGNOSIS — I38 Endocarditis, valve unspecified: Secondary | ICD-10-CM | POA: Diagnosis not present

## 2023-05-26 DIAGNOSIS — N390 Urinary tract infection, site not specified: Secondary | ICD-10-CM | POA: Diagnosis present

## 2023-05-26 DIAGNOSIS — F1210 Cannabis abuse, uncomplicated: Secondary | ICD-10-CM | POA: Diagnosis present

## 2023-05-26 DIAGNOSIS — I10 Essential (primary) hypertension: Secondary | ICD-10-CM | POA: Diagnosis present

## 2023-05-26 DIAGNOSIS — G4700 Insomnia, unspecified: Secondary | ICD-10-CM | POA: Diagnosis present

## 2023-05-26 DIAGNOSIS — R338 Other retention of urine: Secondary | ICD-10-CM | POA: Diagnosis not present

## 2023-05-26 DIAGNOSIS — Z5941 Food insecurity: Secondary | ICD-10-CM | POA: Diagnosis not present

## 2023-05-26 DIAGNOSIS — G8254 Quadriplegia, C5-C7 incomplete: Secondary | ICD-10-CM | POA: Diagnosis present

## 2023-05-26 DIAGNOSIS — F4310 Post-traumatic stress disorder, unspecified: Secondary | ICD-10-CM | POA: Diagnosis present

## 2023-05-26 DIAGNOSIS — F1910 Other psychoactive substance abuse, uncomplicated: Secondary | ICD-10-CM | POA: Diagnosis not present

## 2023-05-26 DIAGNOSIS — D291 Benign neoplasm of prostate: Secondary | ICD-10-CM | POA: Diagnosis present

## 2023-05-26 DIAGNOSIS — B9562 Methicillin resistant Staphylococcus aureus infection as the cause of diseases classified elsewhere: Secondary | ICD-10-CM | POA: Diagnosis present

## 2023-05-26 DIAGNOSIS — Z87440 Personal history of urinary (tract) infections: Secondary | ICD-10-CM | POA: Diagnosis not present

## 2023-05-26 DIAGNOSIS — R45851 Suicidal ideations: Principal | ICD-10-CM

## 2023-05-26 DIAGNOSIS — Z79891 Long term (current) use of opiate analgesic: Secondary | ICD-10-CM | POA: Diagnosis not present

## 2023-05-26 DIAGNOSIS — N410 Acute prostatitis: Secondary | ICD-10-CM | POA: Diagnosis present

## 2023-05-26 DIAGNOSIS — Z888 Allergy status to other drugs, medicaments and biological substances status: Secondary | ICD-10-CM | POA: Diagnosis not present

## 2023-05-26 DIAGNOSIS — F1410 Cocaine abuse, uncomplicated: Secondary | ICD-10-CM | POA: Diagnosis present

## 2023-05-26 DIAGNOSIS — N319 Neuromuscular dysfunction of bladder, unspecified: Secondary | ICD-10-CM | POA: Diagnosis present

## 2023-05-26 DIAGNOSIS — Z9081 Acquired absence of spleen: Secondary | ICD-10-CM | POA: Diagnosis not present

## 2023-05-26 DIAGNOSIS — F419 Anxiety disorder, unspecified: Secondary | ICD-10-CM | POA: Diagnosis present

## 2023-05-26 DIAGNOSIS — Z79899 Other long term (current) drug therapy: Secondary | ICD-10-CM | POA: Diagnosis not present

## 2023-05-26 HISTORY — PX: TRANSURETHRAL RESECTION OF PROSTATE: SHX73

## 2023-05-26 LAB — CBC WITH DIFFERENTIAL/PLATELET
Abs Immature Granulocytes: 0.03 10*3/uL (ref 0.00–0.07)
Basophils Absolute: 0.1 10*3/uL (ref 0.0–0.1)
Basophils Relative: 1 %
Eosinophils Absolute: 0.3 10*3/uL (ref 0.0–0.5)
Eosinophils Relative: 4 %
HCT: 41.6 % (ref 39.0–52.0)
Hemoglobin: 13.7 g/dL (ref 13.0–17.0)
Immature Granulocytes: 0 %
Lymphocytes Relative: 22 %
Lymphs Abs: 1.9 10*3/uL (ref 0.7–4.0)
MCH: 28 pg (ref 26.0–34.0)
MCHC: 32.9 g/dL (ref 30.0–36.0)
MCV: 85.1 fL (ref 80.0–100.0)
Monocytes Absolute: 1.2 10*3/uL — ABNORMAL HIGH (ref 0.1–1.0)
Monocytes Relative: 14 %
Neutro Abs: 5.1 10*3/uL (ref 1.7–7.7)
Neutrophils Relative %: 59 %
Platelets: 544 10*3/uL — ABNORMAL HIGH (ref 150–400)
RBC: 4.89 MIL/uL (ref 4.22–5.81)
RDW: 15.1 % (ref 11.5–15.5)
WBC: 8.6 10*3/uL (ref 4.0–10.5)
nRBC: 0 % (ref 0.0–0.2)

## 2023-05-26 LAB — URINALYSIS, ROUTINE W REFLEX MICROSCOPIC
Bilirubin Urine: NEGATIVE
Glucose, UA: NEGATIVE mg/dL
Ketones, ur: 5 mg/dL — AB
Nitrite: POSITIVE — AB
Protein, ur: >=300 mg/dL — AB
RBC / HPF: >50 RBC/hpf (ref 0–5)
Specific Gravity, Urine: >1.046 — ABNORMAL HIGH (ref 1.005–1.030)
WBC, UA: >50 WBC/hpf (ref 0–5)
pH: 6 (ref 5.0–8.0)

## 2023-05-26 SURGERY — TURP (TRANSURETHRAL RESECTION OF PROSTATE)
Anesthesia: General

## 2023-05-26 MED ORDER — DEXTROSE 5 % IV SOLN
INTRAVENOUS | Status: DC | PRN
  Administered 2023-05-26: 2 g via INTRAVENOUS

## 2023-05-26 MED ORDER — ACETAMINOPHEN 325 MG PO TABS
650.0000 mg | ORAL_TABLET | Freq: Four times a day (QID) | ORAL | Status: DC | PRN
  Filled 2023-05-26: qty 2

## 2023-05-26 MED ORDER — BISACODYL 5 MG PO TBEC: 5.0000 mg | DELAYED_RELEASE_TABLET | Freq: Every day | ORAL | Status: DC | PRN

## 2023-05-26 MED ORDER — ACETAMINOPHEN 10 MG/ML IV SOLN
1000.0000 mg | Freq: Once | INTRAVENOUS | Status: DC | PRN
Start: 2023-05-26 — End: 2023-05-26

## 2023-05-26 MED ORDER — CLINDAMYCIN PHOSPHATE 600 MG/50ML IV SOLN
600.0000 mg | Freq: Once | INTRAVENOUS | Status: AC
Start: 2023-05-26 — End: 2023-05-26
  Administered 2023-05-26: 600 mg via INTRAVENOUS
  Filled 2023-05-26: qty 50

## 2023-05-26 MED ORDER — SODIUM CHLORIDE 0.9 % IV SOLN
2.0000 g | Freq: Once | INTRAVENOUS | Status: AC
Start: 2023-05-26 — End: 2023-05-26
  Administered 2023-05-26: 2 g via INTRAVENOUS
  Filled 2023-05-26: qty 20

## 2023-05-26 MED ORDER — LIDOCAINE 2% (20 MG/ML) 5 ML SYRINGE
INTRAMUSCULAR | Status: DC | PRN
  Administered 2023-05-26: 80 mg via INTRAVENOUS

## 2023-05-26 MED ORDER — HYDROMORPHONE HCL 1 MG/ML IJ SOLN
1.0000 mg | Freq: Once | INTRAMUSCULAR | Status: AC
  Administered 2023-05-26: 1 mg via INTRAVENOUS
  Filled 2023-05-26: qty 1

## 2023-05-26 MED ORDER — ONDANSETRON HCL 4 MG/2ML IJ SOLN
INTRAMUSCULAR | Status: AC
Start: 2023-05-26 — End: ?
  Filled 2023-05-26: qty 2

## 2023-05-26 MED ORDER — ONDANSETRON HCL 4 MG PO TABS: 4.0000 mg | ORAL_TABLET | Freq: Four times a day (QID) | ORAL | Status: DC | PRN

## 2023-05-26 MED ORDER — HYDROMORPHONE HCL 1 MG/ML IJ SOLN
0.5000 mg | INTRAMUSCULAR | Status: DC | PRN
  Administered 2023-05-26 – 2023-05-29 (×21): 1 mg via INTRAVENOUS
  Filled 2023-05-26 (×21): qty 1

## 2023-05-26 MED ORDER — ACETAMINOPHEN 650 MG RE SUPP: 650.0000 mg | Freq: Four times a day (QID) | RECTAL | Status: DC | PRN

## 2023-05-26 MED ORDER — SUGAMMADEX SODIUM 200 MG/2ML IV SOLN
INTRAVENOUS | Status: DC | PRN
  Administered 2023-05-26: 200 mg via INTRAVENOUS

## 2023-05-26 MED ORDER — FENTANYL CITRATE (PF) 100 MCG/2ML IJ SOLN
INTRAMUSCULAR | Status: DC | PRN
  Administered 2023-05-26: 25 ug via INTRAVENOUS

## 2023-05-26 MED ORDER — PHENYLEPHRINE HCL (PRESSORS) 10 MG/ML IV SOLN
INTRAVENOUS | Status: DC | PRN
  Administered 2023-05-26 (×6): 80 ug via INTRAVENOUS

## 2023-05-26 MED ORDER — AMISULPRIDE (ANTIEMETIC) 5 MG/2ML IV SOLN: 10.0000 mg | Freq: Once | INTRAVENOUS | Status: DC | PRN

## 2023-05-26 MED ORDER — SODIUM CHLORIDE 0.9 % IR SOLN
Status: DC | PRN
  Administered 2023-05-26: 3000 mL via INTRAVESICAL
  Administered 2023-05-26 (×2): 6000 mL via INTRAVESICAL

## 2023-05-26 MED ORDER — ONDANSETRON HCL 4 MG/2ML IJ SOLN
INTRAMUSCULAR | Status: DC | PRN
  Administered 2023-05-26: 4 mg via INTRAVENOUS

## 2023-05-26 MED ORDER — OXYCODONE HCL 5 MG PO TABS
5.0000 mg | ORAL_TABLET | ORAL | Status: DC | PRN
  Administered 2023-05-26 – 2023-05-28 (×4): 5 mg via ORAL
  Filled 2023-05-26 (×4): qty 1

## 2023-05-26 MED ORDER — MIDAZOLAM HCL 2 MG/2ML IJ SOLN
INTRAMUSCULAR | Status: AC
  Filled 2023-05-26: qty 2

## 2023-05-26 MED ORDER — MORPHINE SULFATE (PF) 4 MG/ML IV SOLN
4.0000 mg | Freq: Once | INTRAVENOUS | Status: AC
  Administered 2023-05-26: 4 mg via INTRAVENOUS
  Filled 2023-05-26: qty 1

## 2023-05-26 MED ORDER — HYDROMORPHONE HCL 1 MG/ML IJ SOLN
0.2500 mg | INTRAMUSCULAR | Status: DC | PRN
  Administered 2023-05-26: 0.5 mg via INTRAVENOUS

## 2023-05-26 MED ORDER — HYDROMORPHONE HCL 1 MG/ML IJ SOLN
INTRAMUSCULAR | Status: AC
  Administered 2023-05-26: 0.5 mg via INTRAVENOUS
  Filled 2023-05-26: qty 1

## 2023-05-26 MED ORDER — OXYCODONE HCL 5 MG/5ML PO SOLN: 5.0000 mg | Freq: Once | ORAL | Status: DC | PRN

## 2023-05-26 MED ORDER — PROPOFOL 10 MG/ML IV BOLUS
INTRAVENOUS | Status: AC
  Filled 2023-05-26: qty 20

## 2023-05-26 MED ORDER — DEXAMETHASONE SODIUM PHOSPHATE 4 MG/ML IJ SOLN
INTRAMUSCULAR | Status: DC | PRN
  Administered 2023-05-26: 5 mg via INTRAVENOUS

## 2023-05-26 MED ORDER — SODIUM CHLORIDE 0.9 % IV SOLN: 2.0000 g | Freq: Once | INTRAVENOUS | Status: DC

## 2023-05-26 MED ORDER — DULOXETINE HCL 30 MG PO CPEP
30.0000 mg | ORAL_CAPSULE | Freq: Two times a day (BID) | ORAL | Status: DC
  Administered 2023-05-26 – 2023-05-31 (×11): 30 mg via ORAL
  Filled 2023-05-26 (×11): qty 1

## 2023-05-26 MED ORDER — ONDANSETRON HCL 4 MG/2ML IJ SOLN
4.0000 mg | Freq: Four times a day (QID) | INTRAMUSCULAR | Status: DC | PRN
  Administered 2023-05-26 (×2): 4 mg via INTRAVENOUS
  Filled 2023-05-26 (×2): qty 2

## 2023-05-26 MED ORDER — CHLORHEXIDINE GLUCONATE CLOTH 2 % EX PADS
6.0000 | MEDICATED_PAD | Freq: Every day | CUTANEOUS | Status: DC
  Administered 2023-05-26 – 2023-05-31 (×6): 6 via TOPICAL

## 2023-05-26 MED ORDER — LIDOCAINE HCL (PF) 2 % IJ SOLN
INTRAMUSCULAR | Status: AC
  Filled 2023-05-26: qty 5

## 2023-05-26 MED ORDER — CHLORHEXIDINE GLUCONATE 0.12 % MT SOLN
15.0000 mL | Freq: Once | OROMUCOSAL | Status: AC
  Administered 2023-05-26: 15 mL via OROMUCOSAL

## 2023-05-26 MED ORDER — ONDANSETRON HCL 4 MG/2ML IJ SOLN: 4.0000 mg | Freq: Once | INTRAMUSCULAR | Status: DC | PRN

## 2023-05-26 MED ORDER — FENTANYL CITRATE (PF) 100 MCG/2ML IJ SOLN
INTRAMUSCULAR | Status: AC
  Filled 2023-05-26: qty 2

## 2023-05-26 MED ORDER — CLINDAMYCIN PHOSPHATE 600 MG/50ML IV SOLN
600.0000 mg | Freq: Three times a day (TID) | INTRAVENOUS | Status: DC
  Administered 2023-05-26 – 2023-05-27 (×3): 600 mg via INTRAVENOUS
  Filled 2023-05-26 (×5): qty 50

## 2023-05-26 MED ORDER — ENOXAPARIN SODIUM 40 MG/0.4ML IJ SOSY
40.0000 mg | PREFILLED_SYRINGE | Freq: Every day | INTRAMUSCULAR | Status: DC
  Administered 2023-05-26 – 2023-05-28 (×3): 40 mg via SUBCUTANEOUS
  Filled 2023-05-26 (×3): qty 0.4

## 2023-05-26 MED ORDER — DEXMEDETOMIDINE HCL IN NACL 80 MCG/20ML IV SOLN
INTRAVENOUS | Status: DC | PRN
  Administered 2023-05-26: 8 ug via INTRAVENOUS

## 2023-05-26 MED ORDER — POLYETHYLENE GLYCOL 3350 17 G PO PACK: 17.0000 g | PACK | Freq: Every day | ORAL | Status: DC | PRN

## 2023-05-26 MED ORDER — ROCURONIUM BROMIDE 10 MG/ML (PF) SYRINGE
PREFILLED_SYRINGE | INTRAVENOUS | Status: DC | PRN
  Administered 2023-05-26: 40 mg via INTRAVENOUS

## 2023-05-26 MED ORDER — DEXAMETHASONE SODIUM PHOSPHATE 10 MG/ML IJ SOLN
INTRAMUSCULAR | Status: AC
  Filled 2023-05-26: qty 1

## 2023-05-26 MED ORDER — OXYCODONE HCL 5 MG PO TABS: 5.0000 mg | ORAL_TABLET | Freq: Once | ORAL | Status: DC | PRN

## 2023-05-26 MED ORDER — METOCLOPRAMIDE HCL 5 MG/ML IJ SOLN: 10.0000 mg | Freq: Three times a day (TID) | INTRAMUSCULAR | Status: DC | PRN

## 2023-05-26 MED ORDER — LACTATED RINGERS IV SOLN: INTRAVENOUS | Status: DC

## 2023-05-26 MED ORDER — MIDAZOLAM HCL 5 MG/5ML IJ SOLN
INTRAMUSCULAR | Status: DC | PRN
  Administered 2023-05-26: 2 mg via INTRAVENOUS

## 2023-05-26 MED ORDER — PROPOFOL 10 MG/ML IV BOLUS
INTRAVENOUS | Status: DC | PRN
  Administered 2023-05-26: 150 mg via INTRAVENOUS

## 2023-05-26 MED ORDER — SODIUM CHLORIDE 0.9 % IV SOLN
INTRAVENOUS | Status: AC
  Filled 2023-05-26: qty 20

## 2023-05-26 MED ORDER — DIPHENHYDRAMINE HCL 25 MG PO CAPS
25.0000 mg | ORAL_CAPSULE | Freq: Four times a day (QID) | ORAL | Status: DC | PRN
  Administered 2023-05-26 – 2023-05-30 (×5): 25 mg via ORAL
  Filled 2023-05-26 (×5): qty 1

## 2023-05-26 SURGICAL SUPPLY — 24 items
BAG URINE DRAIN 2000ML AR STRL (UROLOGICAL SUPPLIES) ×1 IMPLANT
BAG URO CATCHER STRL LF (MISCELLANEOUS) ×1 IMPLANT
CATH FOLEY 3WAY 30CC 22FR (CATHETERS) IMPLANT
CATH FOLEY 3WAY 30CC 24FR (CATHETERS)
CATH URTH STD 24FR FL 3W 2 (CATHETERS) IMPLANT
DEVICE STATLOCK FOLEY SWIVEL S (MISCELLANEOUS) IMPLANT
DRAPE FOOT SWITCH (DRAPES) ×1 IMPLANT
ELECT REM PT RETURN 15FT ADLT (MISCELLANEOUS) IMPLANT
GLOVE SURG LX STRL 7.5 STRW (GLOVE) ×1 IMPLANT
GOWN STRL REUS W/ TWL XL LVL3 (GOWN DISPOSABLE) ×1 IMPLANT
GOWN STRL REUS W/TWL XL LVL3 (GOWN DISPOSABLE) ×1
GUIDEWIRE STR DUAL SENSOR (WIRE) IMPLANT
HOLDER FOLEY CATH W/STRAP (MISCELLANEOUS) IMPLANT
IV CATH 14GX2 1/4 (CATHETERS) IMPLANT
KIT TURNOVER KIT A (KITS) IMPLANT
LOOP CUT BIPOLAR 24F LRG (ELECTROSURGICAL) IMPLANT
MANIFOLD NEPTUNE II (INSTRUMENTS) ×1 IMPLANT
PACK CYSTO (CUSTOM PROCEDURE TRAY) ×1 IMPLANT
PAD PREP 24X48 CUFFED NSTRL (MISCELLANEOUS) ×1 IMPLANT
SYR 30ML LL (SYRINGE) ×1 IMPLANT
SYR TOOMEY IRRIG 70ML (MISCELLANEOUS) ×1
SYRINGE TOOMEY IRRIG 70ML (MISCELLANEOUS) ×1 IMPLANT
TUBING CONNECTING 10 (TUBING) ×1 IMPLANT
TUBING UROLOGY SET (TUBING) ×1 IMPLANT

## 2023-05-26 NOTE — Progress Notes (Signed)
PROGRESS NOTE    Mark Porter  ZOX:[id number] DOB: Feb 05, 1966 DOA: 05/25/2023 PCP: Devra Dopp, MD  Chief Complaint  Patient presents with   Suicidal   Flank Pain    Left flank    Hospital Course:  Mark Porter is 57 y.o. male with chronic pain, substance abuse, urinary retention, who presented with flank pain, suprapubic pain and penile pain.  Patient reports his penile and suprapubic pain has been ongoing for many weeks.  Was previously seen in the ED 1 week ago and was prescribed antibiotics but never picked them up.  Patient also reports that the pain is now so severe he has been contemplating suicide which she would plan to do by medication overdose.  In the ED patient was found to be febrile WBC 10,000, platelets 5 65K.  UA consistent with pyuria, bacteriuria, nitrates.  CT was concerning for prostatic abscess versus measuring up to 1.4 x 2.5 cm.  Blood and urine cultures were sent from the ED.  Patient was started on pain control. Pharmacist reviewed recent microbiology data for this patient and recommended clindamycin.   Subjective: On evaluation this morning patient is complaining of penile pain at site of catheter.  Catheter is draining dark yellow urine, no hematuria.  Patient does report that overall his pain is somewhat improved since yesterday.    Objective: Vitals:   05/26/23 0326 05/26/23 0326 05/26/23 0415 05/26/23 0614  BP:   91/80 (!) 127/110  Pulse: 73  81 73  Resp:      Temp:  98.5 F (36.9 C)  98 F (36.7 C)  TempSrc:  Oral  Oral  SpO2: 97%  100% 95%  Weight:      Height:        Intake/Output Summary (Last 24 hours) at 05/26/2023 1319 Last data filed at 05/26/2023 1024 Gross per 24 hour  Intake 870 ml  Output --  Net 870 ml   Filed Weights   05/25/23 0653  Weight: 72.6 kg    Examination:  General exam: Appears calm and comfortable  Respiratory system: Clear to auscultation. Respiratory effort normal. Cardiovascular system: S1 & S2  heard, RRR. No JVD, murmurs, rubs, gallops or clicks. No pedal edema. Gastrointestinal system: Abdomen is nondistended, soft and nontender.  Large midline hernia reducible Central nervous system: Alert and oriented. No focal neurological deficits. Extremities: Symmetric 5 x 5 power. Skin: No rashes, lesions or ulcers Psychiatry: Judgement and insight appear normal. Mood & affect appropriate.  GU: Dark yellow urine in Foley bag.    Assessment & Plan:   Principal Problem:   Acute UTI Active Problems:   Polysubstance abuse (HCC)   Suicidal ideation  UTI Prostate abscesses  - Started on clindamycin in ED after review of prior culture data and discussion with pharmacy  - Continue clindamycin for now, hopefully will obtain intra-op cultures -Consulted urology who plans to take the patient for TURP.  Transfer to [facility] placed.  Checkout given to accepting hospitalist at [facility], Dr. Sunnie Nielsen  - Continue pain control and bowel regimen - Follow urine cultures - Follow blood cultures  Suicidal ideation  - Evaluated by psychiatry in ED who did not feel that he requires inpatient psychiatry  - Cont suicide precautions for now, consider reevaluation if SI is persistent despite adequate pain control   Urinary retention  -Foley in place.  Draining appropriately.  Continue for now.  MRSA PCR + - On clinda for current infection per pharm recs  Thrombocytosis Plt  565 on arrival, like 2/2 to infection.  Repeat CBC pending   DVT prophylaxis: Lovenox Code Status: Full Family Communication: Pt has capacity, care discussed with him extensively when rounding Disposition:   Status is: Inpatient Remains inpatient appropriate because: undergoing TURP today, pending cultures to determine abx and duration   Consultants:  Urology, Dr. Cardell Peach  Procedures:  Pending TURP  Antimicrobials:  Anti-infectives (From admission, onward)    Start     Dose/Rate Route Frequency Ordered Stop   05/26/23  1200  clindamycin (CLEOCIN) IVPB 600 mg        600 mg 100 mL/hr over 30 Minutes Intravenous Every 8 hours 05/26/23 0505     05/26/23 0130  clindamycin (CLEOCIN) IVPB 600 mg        600 mg 100 mL/hr over 30 Minutes Intravenous  Once 05/26/23 0116 05/26/23 0351   05/26/23 0130  cefTRIAXone (ROCEPHIN) 2 g in sodium chloride 0.9 % 100 mL IVPB        2 g 200 mL/hr over 30 Minutes Intravenous  Once 05/26/23 0126 05/26/23 0319   05/26/23 0115  cefTRIAXone (ROCEPHIN) 2 g in sodium chloride 0.9 % 100 mL IVPB  Status:  Discontinued        2 g 200 mL/hr over 30 Minutes Intravenous  Once 05/26/23 0108 05/26/23 0110   05/25/23 1600  clindamycin (CLEOCIN) capsule 300 mg  Status:  Discontinued        300 mg Oral 3 times daily 05/25/23 1558 05/26/23 0118   05/25/23 1600  fluconazole (DIFLUCAN) tablet 150 mg        150 mg Oral  Once 05/25/23 1558 05/25/23 1612       Data Reviewed: I have personally reviewed following labs and imaging studies  CBC: Recent Labs  Lab 05/25/23 0832  WBC 10.9*  NEUTROABS 6.1  HGB 14.0  HCT 42.4  MCV 83.8  PLT 565*    Basic Metabolic Panel: Recent Labs  Lab 05/25/23 0832  NA 137  K 3.5  CL 107  CO2 21*  GLUCOSE 100*  BUN 14  CREATININE 0.85  CALCIUM 9.1    GFR: Estimated Creatinine Clearance: 89.6 mL/min (by C-G formula based on SCr of 0.85 mg/dL).  Liver Function Tests: Recent Labs  Lab 05/25/23 0832  AST 45*  ALT 47*  ALKPHOS 72  BILITOT 0.8  PROT 7.8  ALBUMIN 3.5    CBG: No results for input(s): "GLUCAP" in the last 168 hours.   Recent Results (from the past 240 hour(s))  Urine Culture     Status: None   Collection Time: 05/19/23  3:13 AM   Specimen: Urine, Clean Catch  Result Value Ref Range Status   Specimen Description URINE, CLEAN CATCH  Final   Special Requests NONE  Final   Culture   Final    NO GROWTH Performed at [facility], [address] [place], [place] [address]    Report Status 05/20/2023 FINAL  Final          Radiology Studies: CT ABDOMEN PELVIS W CONTRAST  Result Date: 05/25/2023 CLINICAL DATA:  Abdominal pain, acute, nonlocalized mult prior surg, now w diffuse pain, anorexia. EXAM: CT ABDOMEN AND PELVIS WITH CONTRAST TECHNIQUE: Multidetector CT imaging of the abdomen and pelvis was performed using the standard protocol following bolus administration of intravenous contrast. RADIATION DOSE REDUCTION: This exam was performed according to the departmental dose-optimization program which includes automated exposure control, adjustment of the mA and/or kV according to patient  size and/or use of iterative reconstruction technique. CONTRAST:  75mL ISOVUE-370 IOPAMIDOL (ISOVUE-370) INJECTION 76% COMPARISON:  CT scan abdomen and pelvis from 05/06/2023. FINDINGS: Lower chest: There are subpleural atelectatic changes in the visualized lung bases. No overt consolidation. No pleural effusion. The heart is normal in size. No pericardial effusion. Hepatobiliary: The liver is normal in size. Non-cirrhotic configuration. No suspicious mass. These is mild diffuse hepatic steatosis. No intrahepatic or extrahepatic bile duct dilation. No calcified gallstones. Normal gallbladder wall thickness. No pericholecystic inflammatory changes. Pancreas: Unremarkable. No pancreatic ductal dilatation or surrounding inflammatory changes. 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 Adrenals/Urinary Tract: Adrenal glands are unremarkable. No suspicious renal mass. No hydronephrosis. No renal or ureteric calculi. Urinary bladder is decompressed secondary to Foley catheter. Stomach/Bowel: There is mild circumferential thickening of the lower thoracic esophagus, which is most likely seen in the settings of chronic gastroesophageal reflux disease versus esophagitis. No disproportionate dilation of the small or large bowel loops. No evidence of abnormal bowel wall thickening or inflammatory  changes. The appendix is unremarkable. Vascular/Lymphatic: No ascites or pneumoperitoneum. No abdominal or pelvic lymphadenopathy, by size criteria. No aneurysmal dilation of the major abdominal arteries. There are mild peripheral atherosclerotic vascular calcifications of the aorta and its major branches. Reproductive: Mildly enlarged prostate gland. There are several hypoattenuating foci in the prostate gland with largest measuring up to 1.4 x 2.5 cm which exhibits incomplete peripheral hyperattenuating wall. Differential diagnosis includes prostatic abscess versus prostatic cysts. Correlate clinically and with urinalysis. Other: There are several midline supraumbilical fat containing ventral hernias. No herniation of bowel loop. There is also a small fat containing left inguinal hernia. The soft tissues and abdominal wall are otherwise unremarkable. Musculoskeletal: No suspicious osseous lesions. There are mild - moderate multilevel degenerative changes in the visualized spine. 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 There is mild anterior wedging deformity of T12 vertebral body which also exhibits superior endplate Schmorl's node. No significant interval change. IMPRESSION: *There are several hypoattenuating foci in the prostate, as described above, which may represent prostatic abscess versus prostatic cysts. Correlate clinically and with urinalysis. *Multiple other nonacute observations, as described above. Electronically Signed   By: Jules Schick M.D.   On: 05/25/2023 15:27   DG Chest 2 View  Result Date: 05/25/2023 CLINICAL DATA:  L axilla pain EXAM: CHEST - 2 VIEW COMPARISON:  CXR 04/15/23 FINDINGS: No pleural effusion. No pneumothorax. Unchanged cardiac and mediastinal contours. Interval improvement in previously noted hazy opacity in the retrocardiac region. No radiographically apparent displaced rib fractures. Visualized upper abdomen is unremarkable. Vertebral body heights are maintained.  IMPRESSION: Interval improvement in previously noted hazy opacity in the retrocardiac region. No radiographically apparent displaced rib fractures. Electronically Signed   By: Lorenza Cambridge M.D.   On: 05/25/2023 09:09        Scheduled Meds:  baclofen  10 mg Oral TID   Chlorhexidine Gluconate Cloth  6 each Topical Daily   DULoxetine  30 mg Oral BID   enoxaparin (LOVENOX) injection  40 mg Subcutaneous Daily   Continuous Infusions:  clindamycin (CLEOCIN) IV 600 mg (05/26/23 1239)     LOS: 0 days    Time spent:     Debarah Crape, DO [facility]   To contact the attending provider between 7A-7P or the covering provider during after hours 7P-7A, please log into the web site [email or web address] and access using universal [place] password for that web site. If you do not  have the password, please call the hospital operator.  05/26/2023, 1:19 PM

## 2023-05-26 NOTE — Progress Notes (Signed)
Pt transported by [facility] to [id number].
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
## 2023-05-26 NOTE — Op Note (Signed)
Operative Note  Preoperative diagnosis:  1.  Prostate abscess  Postoperative diagnosis: 1.  Prostate abscess  Procedure(s): 1.  Bipolar transurethral resection of prostate with enrooting of prostate abscess  Surgeon: Jettie Pagan, MD  Assistants:  Domingo Mend, PGY-4  Anesthesia:  General  Complications:  None  EBL:  Minimal  Specimens: 1. Prostate chips  Drains/Catheters: 1.  22Fr 3 way catheter with 30ml water into balloon  Intraoperative findings:   Abscess cavity relatively deep in the right lateral lobe with excellent resection of right lateral lobe and unroofing of abscess. Excellent hemostasis  Indication:  Mark Porter is a 57 y.o. male with a prostate abscess who presents for TURP with unroofing of abscess.  Description of procedure: The indication, alternatives, benefits and risks were discussed with the patient and informed consent was obtained.  Patient was brought to the operating room table, positioned supine, secured with a safety strap.  Pneumatic compression devices were placed on the lower extremities.  After the administration of intravenous antibiotics and general anesthesia, the patient was repositioned into the dorsal lithotomy position.  All pressure points were carefully padded.  A rectal examination was performed confirming a smooth symmetric enlarged gland.  The genitalia were prepped and draped in standard sterile manner.  A timeout was completed, verifying the correct patient, surgical procedure and positioning prior to beginning the procedure.  Isotonic sodium chloride was used for irrigation.  A 26 French continuous-flow resectoscope sheath with the visual obturator and a 30 degree lens was advanced under direct vision into the bladder.  The anterior urethra appeared normal in its entirety.The prostatic urethra was elongated with trilobar hyperplasia.  On cystoscopic evaluation, his bladder capacity appeared normal, the bladder wall was noted to  expand symmetrically in all dimensions.  There were no tumors, stones or foreign bodies present. The bladder was trabeculated with normal-appearing mucosa.  Both ureteral orifices were in their normal anatomic positions with clear urinary reflux noted bilaterally.  The obturator was removed and replaced by the working element with a resection loop.  The location of the ureteral orifices and the prostatic configuration were again confirmed.  Starting at the bladder neck and proceeding distally to the verumontanum a transurethral section of the prostate was performed using bipolar using energy of 4 and 5 for cutting and coagulation, respectively.  The procedure began at the bladder neck at 7 o'clock positions and carefully carried distally to the verumontanum, resecting the intervening prostatic adenoma. Of note although on imaging the abscess cavity appeared to be close to the catheter, this required a deep resection in order to accomplish adequate drainage. The cavity was opened and approximately 10ml purulent whitish discharge was evacuated.    All bleeding vessels were fulgurated achieving meticulous hemostasis.  The bladder was irrigated with a Toomey syringe, ensuring removal of all prostate chips which were sent to pathology for evaluation.  Having completed the resection and the chips removed, we again confirmed hemostasis with the loop with coagulating current.  Upon completion of the entire procedure, the bladder and posterior urethra were reexamined, confirming open prostatic urethra and bladder neck without evidence of bleeding or perforation.  Both ureteral orifices and the external sphincter were noted to be intact.  The resectoscope was withdrawn under direct vision and a 22 French three-way Foley catheter with a 30 cc balloon was inserted into the bladder.  The balloon was inflated with 30 cc of sterile water. After multiple manual irrigations ensuring light pink return of the irrigant,  the  procedure was terminated.  The catheter was attached to a drainage bag. The patient was positioned supine.  At the end of the procedure, all counts were correct.  Patient tolerated the procedure well and was taken to the recovery room satisfactory condition.  Plan: Leave foley catheter for maximal drainage for at least 48 hours. Continue broad spectrum antibiotics. F/u urine culture.  Matt R. Devora Tortorella MD [facility]  Pager: [phone]

## 2023-05-26 NOTE — ED Provider Notes (Signed)
[place] EMERGENCY DEPARTMENT AT [facility] Provider Note   CSN: [id number] Arrival date & time: 05/25/23  1914     History  Chief Complaint  Patient presents with   Suicidal   Flank Pain    Left flank    Mark Porter is a 57 y.o. male.  Patient was here couple times last couple weeks diagnosed UTI and urinary obstruction status post Foley.  Given antibiotics in the ER but never started them.  Presented back today/yesterday it seems for worsening pain to the point that he wanted to kill himself.  Tells me that the pain was in his lower abdomen, scrotum and perineal area.  Initially patient had CT scan and basic labs done.  Started on oral medications and given some IV medicine during the workup.  CT scan had been found to show hypoenhancing areas in his prostate concern for possible infection versus cyst.  No recent CT scans done in system that I can see going back to 2023 where this was not present on a noncontrasted scan.  Patient was evaluated by psychiatry who thought his psychiatric issues were more related to pain rather than a true psychiatric illness.  It did appear that the patient did not have a urinalysis completed at that time.    Flank Pain       Home Medications Prior to Admission medications   Medication Sig Start Date End Date Taking? Authorizing Provider  baclofen (LIORESAL) 10 MG tablet Take 10 mg by mouth 3 (three) times daily. 05/13/20  Yes [provider]  clindamycin (CLEOCIN) 300 MG capsule Take 1 capsule (300 mg total) by mouth 3 (three) times daily. 05/19/23   Roxy Horseman, PA-C  fluconazole (DIFLUCAN) 150 MG tablet Take 1 tablet on day 1, then 1 tablet after finishing the antibiotic 05/19/23 05/26/23  Roxy Horseman, PA-C      Allergies    Carisoprodol    Review of Systems   Review of Systems  Genitourinary:  Positive for flank pain.    Physical Exam Updated Vital Signs BP 105/75 (BP Location: Left Arm)   Pulse 74    Temp 98.1 F (36.7 C) (Oral)   Resp 19   Ht 5\' 7"  (1.702 m)   Wt 72.6 kg   SpO2 97%   BMI 25.07 kg/m  Physical Exam Vitals and nursing note reviewed.  Constitutional:      Appearance: He is well-developed.  HENT:     Head: Normocephalic and atraumatic.     Mouth/Throat:     Mouth: Mucous membranes are dry.  Cardiovascular:     Rate and Rhythm: Tachycardia present.  Pulmonary:     Effort: Pulmonary effort is normal. No respiratory distress.  Abdominal:     General: There is no distension.  Musculoskeletal:        General: Normal range of motion.     Cervical back: Normal range of motion.  Neurological:     Mental Status: He is alert. Mental status is at baseline.     ED Results / Procedures / Treatments   Labs (all labs ordered are listed, but only abnormal results are displayed) Labs Reviewed  COMPREHENSIVE METABOLIC PANEL - Abnormal; Notable for the following components:      Result Value   CO2 21 (*)    Glucose, Bld 100 (*)    AST 45 (*)    ALT 47 (*)    All other components within normal limits  RAPID URINE DRUG SCREEN,  HOSP PERFORMED - Abnormal; Notable for the following components:   Cocaine POSITIVE (*)    Tetrahydrocannabinol POSITIVE (*)    All other components within normal limits  CBC WITH DIFFERENTIAL/PLATELET - Abnormal; Notable for the following components:   WBC 10.9 (*)    Platelets 565 (*)    Monocytes Absolute 1.3 (*)    All other components within normal limits  SALICYLATE LEVEL - Abnormal; Notable for the following components:   Salicylate Lvl <7.0 (*)    All other components within normal limits  ACETAMINOPHEN LEVEL - Abnormal; Notable for the following components:   Acetaminophen (Tylenol), Serum <10 (*)    All other components within normal limits  URINALYSIS, ROUTINE W REFLEX MICROSCOPIC - Abnormal; Notable for the following components:   Color, Urine AMBER (*)    APPearance TURBID (*)    Specific Gravity, Urine >1.046 (*)    Hgb  urine dipstick LARGE (*)    Ketones, ur 5 (*)    Protein, ur >=300 (*)    Nitrite POSITIVE (*)    Leukocytes,Ua MODERATE (*)    Bacteria, UA MANY (*)    All other components within normal limits  URINE CULTURE  CULTURE, BLOOD (ROUTINE X 2)  CULTURE, BLOOD (ROUTINE X 2)  ETHANOL    EKG EKG Interpretation Date/Time:  Wednesday May 25 2023 07:38:47 EST Ventricular Rate:  70 PR Interval:  132 QRS Duration:  96 QT Interval:  420 QTC Calculation: 453 R Axis:   -5  Text Interpretation: Normal sinus rhythm Possible Left atrial enlargement Incomplete right bundle branch block Nonspecific T wave abnormality Abnormal ECG When compared with ECG of 06-May-2023 15:13, PREVIOUS ECG IS PRESENT Confirmed by Gerhard Munch [id number]) on 05/25/2023 8:27:59 AM  Radiology CT ABDOMEN PELVIS W CONTRAST  Result Date: 05/25/2023 CLINICAL DATA:  Abdominal pain, acute, nonlocalized mult prior surg, now w diffuse pain, anorexia. EXAM: CT ABDOMEN AND PELVIS WITH CONTRAST TECHNIQUE: Multidetector CT imaging of the abdomen and pelvis was performed using the standard protocol following bolus administration of intravenous contrast. RADIATION DOSE REDUCTION: This exam was performed according to the departmental dose-optimization program which includes automated exposure control, adjustment of the mA and/or kV according to patient size and/or use of iterative reconstruction technique. CONTRAST:  75mL ISOVUE-370 IOPAMIDOL (ISOVUE-370) INJECTION 76% COMPARISON:  CT scan abdomen and pelvis from 05/06/2023. FINDINGS: Lower chest: There are subpleural atelectatic changes in the visualized lung bases. No overt consolidation. No pleural effusion. The heart is normal in size. No pericardial effusion. Hepatobiliary: The liver is normal in size. Non-cirrhotic configuration. No suspicious mass. These is mild diffuse hepatic steatosis. No intrahepatic or extrahepatic bile duct dilation. No calcified gallstones. Normal gallbladder  wall thickness. No pericholecystic inflammatory changes. Pancreas: Unremarkable. No pancreatic ductal dilatation or surrounding inflammatory changes. 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 Adrenals/Urinary Tract: Adrenal glands are unremarkable. No suspicious renal mass. No hydronephrosis. No renal or ureteric calculi. Urinary bladder is decompressed secondary to Foley catheter. Stomach/Bowel: There is mild circumferential thickening of the lower thoracic esophagus, which is most likely seen in the settings of chronic gastroesophageal reflux disease versus esophagitis. No disproportionate dilation of the small or large bowel loops. No evidence of abnormal bowel wall thickening or inflammatory changes. The appendix is unremarkable. Vascular/Lymphatic: No ascites or pneumoperitoneum. No abdominal or pelvic lymphadenopathy, by size criteria. No aneurysmal dilation of the major abdominal arteries. There are mild peripheral atherosclerotic vascular calcifications of the aorta and its major  branches. Reproductive: Mildly enlarged prostate gland. There are several hypoattenuating foci in the prostate gland with largest measuring up to 1.4 x 2.5 cm which exhibits incomplete peripheral hyperattenuating wall. Differential diagnosis includes prostatic abscess versus prostatic cysts. Correlate clinically and with urinalysis. Other: There are several midline supraumbilical fat containing ventral hernias. No herniation of bowel loop. There is also a small fat containing left inguinal hernia. The soft tissues and abdominal wall are otherwise unremarkable. Musculoskeletal: No suspicious osseous lesions. There are mild - moderate multilevel degenerative changes in the visualized spine. 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 There is mild anterior wedging deformity of T12 vertebral body which also exhibits superior endplate Schmorl's node. No significant interval change.  IMPRESSION: *There are several hypoattenuating foci in the prostate, as described above, which may represent prostatic abscess versus prostatic cysts. Correlate clinically and with urinalysis. *Multiple other nonacute observations, as described above. Electronically Signed   By: Jules Schick M.D.   On: 05/25/2023 15:27   DG Chest 2 View  Result Date: 05/25/2023 CLINICAL DATA:  L axilla pain EXAM: CHEST - 2 VIEW COMPARISON:  CXR 04/15/23 FINDINGS: No pleural effusion. No pneumothorax. Unchanged cardiac and mediastinal contours. Interval improvement in previously noted hazy opacity in the retrocardiac region. No radiographically apparent displaced rib fractures. Visualized upper abdomen is unremarkable. Vertebral body heights are maintained. IMPRESSION: Interval improvement in previously noted hazy opacity in the retrocardiac region. No radiographically apparent displaced rib fractures. Electronically Signed   By: Lorenza Cambridge M.D.   On: 05/25/2023 09:09    Procedures .Critical Care  Performed by: Marily Memos, MD Authorized by: Marily Memos, MD   Critical care provider statement:    Critical care time (minutes):  30   Critical care was necessary to treat or prevent imminent or life-threatening deterioration of the following conditions:  Sepsis   Critical care was time spent personally by me on the following activities:  Development of treatment plan with patient or surrogate, discussions with consultants, evaluation of patient's response to treatment, examination of patient, ordering and review of laboratory studies, ordering and review of radiographic studies, ordering and performing treatments and interventions, pulse oximetry, re-evaluation of patient's condition and review of old charts     Medications Ordered in ED Medications  ibuprofen (ADVIL) tablet 600 mg (600 mg Oral Given 05/25/23 1840)  clindamycin (CLEOCIN) capsule 300 mg (300 mg Oral Given 05/25/23 2155)  baclofen (LIORESAL)  tablet 10 mg (10 mg Oral Given 05/25/23 2156)  acetaminophen (TYLENOL) tablet 650 mg (650 mg Oral Not Given 05/25/23 2104)  cefTRIAXone (ROCEPHIN) 2 g in sodium chloride 0.9 % 100 mL IVPB (has no administration in time range)  HYDROmorphone (DILAUDID) injection 1 mg (has no administration in time range)  morphine (PF) 4 MG/ML injection 4 mg (4 mg Intravenous Given 05/25/23 1015)  iopamidol (ISOVUE-370) 76 % injection 75 mL (75 mLs Intravenous Contrast Given 05/25/23 1223)  morphine (PF) 4 MG/ML injection 4 mg (4 mg Intravenous Given 05/25/23 1544)  fluconazole (DIFLUCAN) tablet 150 mg (150 mg Oral Given 05/25/23 1612)    ED Course/ Medical Decision Making/ A&P Clinical Course as of 05/26/23 0109  Wed May 25, 2023  1311 DG Chest 2 View [CG]    Clinical Course User Index [CG] Al Decant, PA-C                                 Medical Decision Making Amount and/or Complexity  of Data Reviewed Labs: ordered. Radiology: ordered.  Risk Prescription drug management. Decision regarding hospitalization.   Patient was here couple times last couple weeks diagnosed UTI and urinary obstruction status post Foley.  Given antibiotics in the ER but never started them.  Presented back today/yesterday it seems for worsening pain to the point that he wanted to kill himself.  Initially patient had CT scan and basic labs done.  Started on oral medications and given some IV medicine during the workup.  CT scan had been found to show hypoenhancing areas in his prostate concern for possible infection versus cyst.  No recent CT scans done in system that I can see going back to 2023 where this was not present on a noncontrasted scan.  Patient was evaluated by psychiatry who thought his psychiatric issues were more related to pain rather than a true psychiatric illness.  It did appear that the patient did not have a urinalysis completed at that time.  Once urinalysis was obtained it was obvious he had an  infection.  This was off of the Foley and the Foley had been placed since he has been here so it is not indwelling necessarily.  After that he was found to have a fever as well.  Patient likely with prostatitis possible abscesses.  IV antibiotics started after cultures were drawn.  Pain medicine started.  Will admit to the hospital for pain control and following cultures with IV antibiotics.  May be able to de-escalate when urine culture comes back.  Discussed with hospitalist for admission.  Patient's pain improved at this time.   Final Clinical Impression(s) / ED Diagnoses Final diagnoses:  Suicidal ideation  Generalized abdominal pain    Rx / DC Orders ED Discharge Orders     None         Tinsleigh Slovacek, Barbara Cower, MD 05/26/23 [phone]

## 2023-05-26 NOTE — Transfer of Care (Signed)
Immediate Anesthesia Transfer of Care Note  Patient: Mark Porter  Procedure(s) Performed: TRANSURETHRAL RESECTION OF THE PROSTATE (TURP)  Patient Location: PACU  Anesthesia Type:General  Level of Consciousness: sedated  Airway & Oxygen Therapy: Patient Spontanous Breathing and Patient connected to face mask oxygen  Post-op Assessment: Report given to RN and Post -op Vital signs reviewed and stable  Post vital signs: Reviewed and stable  Last Vitals:  Vitals Value Taken Time  BP    Temp    Pulse 68 05/26/23 1912  Resp 9 05/26/23 1912  SpO2 99 % 05/26/23 1912  Vitals shown include unfiled device data.  Last Pain:  Vitals:   05/26/23 1740  TempSrc:   PainSc: 8       Patients Stated Pain Goal: 0 (05/26/23 1740)  Complications: No notable events documented.
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
## 2023-05-26 NOTE — ED Notes (Signed)
Pt transported to [address] 
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
## 2023-05-26 NOTE — ED Notes (Signed)
Pt is agitated. Pt unable to sleep at this time. Pt reports feeling hot. Pt temperature is 98.5 orally.

## 2023-05-26 NOTE — ED Notes (Signed)
Patient reports increased pain.  Tearful at this time

## 2023-05-26 NOTE — Plan of Care (Signed)
  Problem: Fluid Volume: Goal: Hemodynamic stability will improve Outcome: Progressing   Problem: Clinical Measurements: Goal: Diagnostic test results will improve Outcome: Progressing Goal: Signs and symptoms of infection will decrease Outcome: Progressing   Problem: Education: Goal: Knowledge of General Education information will improve Description: Including pain rating scale, medication(s)/side effects and non-pharmacologic comfort measures Outcome: Progressing   Problem: Health Behavior/Discharge Planning: Goal: Ability to manage health-related needs will improve Outcome: Progressing   Problem: Clinical Measurements: Goal: Ability to maintain clinical measurements within normal limits will improve Outcome: Progressing Goal: Will remain free from infection Outcome: Progressing Goal: Diagnostic test results will improve Outcome: Progressing   Problem: Activity: Goal: Risk for activity intolerance will decrease Outcome: Progressing   Problem: Coping: Goal: Level of anxiety will decrease Outcome: Progressing   Problem: Safety: Goal: Ability to remain free from injury will improve Outcome: Progressing   Problem: Skin Integrity: Goal: Risk for impaired skin integrity will decrease Outcome: Progressing   Problem: Respiratory: Goal: Ability to maintain adequate ventilation will improve Outcome: Adequate for Discharge   Problem: Clinical Measurements: Goal: Respiratory complications will improve Outcome: Adequate for Discharge Goal: Cardiovascular complication will be avoided Outcome: Adequate for Discharge

## 2023-05-26 NOTE — Progress Notes (Signed)
Called [facility] and requested transport to [facility] room 1441, Dr. Sunnie Nielsen accepting pt at [facility]. His 1/2 brother, Nelson Chimes called and I told him of transfer and he has concerns about the place pt has been staying. Told [facility] they are wanting to do surgery this afternoon.

## 2023-05-26 NOTE — Anesthesia Procedure Notes (Signed)
Procedure Name: Intubation Date/Time: 05/26/2023 6:21 PM  Performed by: Deri Fuelling, CRNAPre-anesthesia Checklist: Patient identified, Emergency Drugs available, Suction available and Patient being monitored Patient Re-evaluated:Patient Re-evaluated prior to induction Oxygen Delivery Method: Circle system utilized Preoxygenation: Pre-oxygenation with 100% oxygen Induction Type: IV induction Ventilation: Mask ventilation without difficulty Laryngoscope Size: Mac and 4 Grade View: Grade I Tube type: Oral Tube size: 7.5 mm Number of attempts: 1 Airway Equipment and Method: Stylet and Oral airway Placement Confirmation: ETT inserted through vocal cords under direct vision, positive ETCO2, breath sounds checked- equal and bilateral and CO2 detector Secured at: 22 cm Tube secured with: Tape Dental Injury: Teeth and Oropharynx as per pre-operative assessment
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
## 2023-05-26 NOTE — ED Notes (Signed)
Pt eating and drinking

## 2023-05-26 NOTE — Progress Notes (Signed)
Report called to Junious Dresser at [facility] [address].  MD plans for surgery today.  [facility] called by charge nurse.  Family has been notified.  Pt NPO now.  Last meal eaten around 11am.  PIV left for surgery in [place].

## 2023-05-27 ENCOUNTER — Encounter (HOSPITAL_COMMUNITY): Payer: Self-pay | Admitting: Urology

## 2023-05-27 DIAGNOSIS — R45851 Suicidal ideations: Secondary | ICD-10-CM | POA: Diagnosis not present

## 2023-05-27 DIAGNOSIS — R338 Other retention of urine: Secondary | ICD-10-CM | POA: Diagnosis not present

## 2023-05-27 DIAGNOSIS — N412 Abscess of prostate: Secondary | ICD-10-CM

## 2023-05-27 DIAGNOSIS — F1410 Cocaine abuse, uncomplicated: Secondary | ICD-10-CM

## 2023-05-27 DIAGNOSIS — N390 Urinary tract infection, site not specified: Secondary | ICD-10-CM | POA: Diagnosis not present

## 2023-05-27 DIAGNOSIS — F1910 Other psychoactive substance abuse, uncomplicated: Secondary | ICD-10-CM | POA: Diagnosis not present

## 2023-05-27 LAB — CBC WITH DIFFERENTIAL/PLATELET
Abs Immature Granulocytes: 0.03 10*3/uL (ref 0.00–0.07)
Abs Immature Granulocytes: 0.06 10*3/uL (ref 0.00–0.07)
Basophils Absolute: 0 10*3/uL (ref 0.0–0.1)
Basophils Absolute: 0 10*3/uL (ref 0.0–0.1)
Basophils Relative: 0 %
Basophils Relative: 0 %
Eosinophils Absolute: 0 10*3/uL (ref 0.0–0.5)
Eosinophils Absolute: 0 10*3/uL (ref 0.0–0.5)
Eosinophils Relative: 0 %
Eosinophils Relative: 0 %
HCT: 40.4 % (ref 39.0–52.0)
HCT: 40.2 % (ref 39.0–52.0)
Hemoglobin: 13.1 g/dL (ref 13.0–17.0)
Hemoglobin: 12.7 g/dL — ABNORMAL LOW (ref 13.0–17.0)
Immature Granulocytes: 0 %
Immature Granulocytes: 0 %
Lymphocytes Relative: 7 %
Lymphocytes Relative: 7 %
Lymphs Abs: 0.8 10*3/uL (ref 0.7–4.0)
Lymphs Abs: 1 10*3/uL (ref 0.7–4.0)
MCH: 27.9 pg (ref 26.0–34.0)
MCH: 28.2 pg (ref 26.0–34.0)
MCHC: 32.4 g/dL (ref 30.0–36.0)
MCHC: 31.6 g/dL (ref 30.0–36.0)
MCV: 86 fL (ref 80.0–100.0)
MCV: 89.1 fL (ref 80.0–100.0)
Monocytes Absolute: 0.3 10*3/uL (ref 0.1–1.0)
Monocytes Absolute: 0.7 10*3/uL (ref 0.1–1.0)
Monocytes Relative: 3 %
Monocytes Relative: 5 %
Neutro Abs: 9.7 10*3/uL — ABNORMAL HIGH (ref 1.7–7.7)
Neutro Abs: 11.6 10*3/uL — ABNORMAL HIGH (ref 1.7–7.7)
Neutrophils Relative %: 90 %
Neutrophils Relative %: 88 %
Platelets: 508 10*3/uL — ABNORMAL HIGH (ref 150–400)
Platelets: 482 10*3/uL — ABNORMAL HIGH (ref 150–400)
RBC: 4.7 MIL/uL (ref 4.22–5.81)
RBC: 4.51 MIL/uL (ref 4.22–5.81)
RDW: 14.9 % (ref 11.5–15.5)
RDW: 15 % (ref 11.5–15.5)
WBC: 10.8 10*3/uL — ABNORMAL HIGH (ref 4.0–10.5)
WBC: 13.4 10*3/uL — ABNORMAL HIGH (ref 4.0–10.5)
nRBC: 0 % (ref 0.0–0.2)
nRBC: 0 % (ref 0.0–0.2)

## 2023-05-27 LAB — URINE CULTURE

## 2023-05-27 LAB — COMPREHENSIVE METABOLIC PANEL
ALT: 43 U/L (ref 0–44)
AST: 44 U/L — ABNORMAL HIGH (ref 15–41)
Albumin: 3.3 g/dL — ABNORMAL LOW (ref 3.5–5.0)
Alkaline Phosphatase: 65 U/L (ref 38–126)
Anion gap: 10 (ref 5–15)
BUN: 10 mg/dL (ref 6–20)
CO2: 24 mmol/L (ref 22–32)
Calcium: 8.9 mg/dL (ref 8.9–10.3)
Chloride: 100 mmol/L (ref 98–111)
Creatinine, Ser: 0.66 mg/dL (ref 0.61–1.24)
GFR, Estimated: >60 mL/min (ref >60)
Glucose, Bld: 132 mg/dL — ABNORMAL HIGH (ref 70–99)
Potassium: 4 mmol/L (ref 3.5–5.1)
Sodium: 134 mmol/L — ABNORMAL LOW (ref 135–145)
Total Bilirubin: 0.5 mg/dL (ref <1.2)
Total Protein: 7.9 g/dL (ref 6.5–8.1)

## 2023-05-27 LAB — PHOSPHORUS: Phosphorus: 3.2 mg/dL (ref 2.5–4.6)

## 2023-05-27 LAB — MAGNESIUM: Magnesium: 2.1 mg/dL (ref 1.7–2.4)

## 2023-05-27 MED ORDER — ARIPIPRAZOLE 5 MG PO TABS
5.0000 mg | ORAL_TABLET | Freq: Every day | ORAL | Status: DC
  Administered 2023-05-27 – 2023-05-31 (×5): 5 mg via ORAL
  Filled 2023-05-27 (×5): qty 1

## 2023-05-27 MED ORDER — DICLOFENAC SODIUM 1 % EX GEL
2.0000 g | Freq: Four times a day (QID) | CUTANEOUS | Status: DC
  Administered 2023-05-27 – 2023-05-30 (×12): 2 g via TOPICAL
  Filled 2023-05-27: qty 100

## 2023-05-27 MED ORDER — VANCOMYCIN HCL IN DEXTROSE 1-5 GM/200ML-% IV SOLN
1000.0000 mg | Freq: Two times a day (BID) | INTRAVENOUS | Status: DC
  Administered 2023-05-28 – 2023-05-30 (×5): 1000 mg via INTRAVENOUS
  Filled 2023-05-27 (×5): qty 200

## 2023-05-27 MED ORDER — VANCOMYCIN HCL 1500 MG/300ML IV SOLN
1500.0000 mg | Freq: Once | INTRAVENOUS | Status: AC
  Administered 2023-05-27: 1500 mg via INTRAVENOUS
  Filled 2023-05-27: qty 300

## 2023-05-27 MED ORDER — SODIUM CHLORIDE 0.9 % IV SOLN
2.0000 g | Freq: Every day | INTRAVENOUS | Status: DC
  Administered 2023-05-27 – 2023-05-29 (×3): 2 g via INTRAVENOUS
  Filled 2023-05-27 (×3): qty 20
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
## 2023-05-27 NOTE — Progress Notes (Signed)
PROGRESS NOTE   Mark Porter  ZOX:[id number] DOB: 04/18/1966 DOA: 05/25/2023 PCP: Devra Dopp, MD   Date of Service: the patient was seen and examined on 05/27/2023  Brief Narrative:  57 year old male with past medical history of chronic pain syndrome, urinary retention presenting to [facility] emergency department with penile and suprapubic pain as well as left flank pain for approximately 1 week.  Patient also happened to be reporting suicidal ideation on arrival.  Upon evaluation in the emergency department patient found to be febrile with temperature of 38.3 F.  Urinalysis was suggestive of urinary tract infection with CT imaging of the abdomen and pelvis concerning for prostatic abscesses measuring up to 1.4 x 2.5 cm.  The hospitalist group was then called to assess the patient for admission to the hospital.  Blood and urine cultures were obtained.  Patient was initiated on intravenous antibiotics with Rocephin and clindamycin.  Dr. Cardell Peach with urology was consulted who recommended TURP with unroofing of the prostatic abscess.  Patient underwent TURP evening of 11/14 which was successful.  Cultures were sent off and are pending.  Considering recent urine culture growing out MRSA on 10/25, patient was transitioned to intravenous vancomycin on 11/15.  Concerning the patient's suicidal ideation, patient was evaluated by psychiatry and it was felt the patient was not at risk of harming himself.  Patient was initiated on Cymbalta and recommended close outpatient follow-up.     Assessment & Plan Prostate abscess Status post TURP with drainage of prostatic abscess on 11/14 by Dr. Cardell Peach Concerning organism, recent urine culture 10/25 grew out MRSA with more recent urine culture on 11/14 once again growing out Staphylococcus aureus and Providencia rettgeri with sensitivities pending Currently treated with intravenous ceftriaxone and vancomycin Treating associated substantial pain  with as needed opiate-based analgesics Foley catheter remains in place postoperatively with planned voiding trial on Monday 11/18 Urology following, their input is appreciated Acute UTI MRSA UTI identified on urine culture 10/25 More recent urine culture on 11/14 once again growing out Staphylococcus aureus and Providencia rettgeri with sensitivities pending Remainder of assessment and plan as above Polysubstance abuse (HCC) Urine toxicology screen on 11/13 revealing cocaine and THC use Counseling on cessation daily Acute urinary retention Foley catheter remains in place Plan voiding trial on 11/18 Suicidal ideation Evaluated by Dr. Viviano Simas, patient felt to not be at imminent risk to self or others at present Patient placed on Cymbalta 30 mg twice daily for associated depression     Subjective:  Patient complaining of lower abdominal and groin pain, sharp in quality, moderate to severe in intensity, worse with movement and improved with rest.  Physical Exam:  Vitals:   05/27/23 0035 05/27/23 0239 05/27/23 0339 05/27/23 0757  BP: 113/71 111/75 104/65 108/74  Pulse: 85 75 85 72  Resp: 20 18 20 17   Temp: 98.4 F (36.9 C) 98.1 F (36.7 C) 98.1 F (36.7 C) 98.1 F (36.7 C)  TempSrc: Oral Oral Oral Oral  SpO2: 92% 91% 93% 93%  Weight:    74.3 kg  Height:    5\' 7"  (1.702 m)    Constitutional: Awake alert and oriented x3, patient is in distress due to abdominal pain. Skin: no rashes, no lesions, good skin turgor noted. Eyes: Pupils are equally reactive to light.  No evidence of scleral icterus or conjunctival pallor.  ENMT: Moist mucous membranes noted.  Posterior pharynx clear of any exudate or lesions.   Respiratory: clear to auscultation bilaterally, no wheezing, no  crackles. Normal respiratory effort. No accessory muscle use.  Cardiovascular: Regular rate and rhythm, no murmurs / rubs / gallops. No extremity edema. 2+ pedal pulses. No carotid bruits.  Abdomen: Lower  abdominal tenderness.  Abdomen is soft.  No evidence of intra-abdominal masses.  Positive bowel sounds noted in all quadrants.   Musculoskeletal: No joint deformity upper and lower extremities. Good ROM, no contractures. Normal muscle tone.  GU: Foley catheter in place draining slightly bloody urine   Data Reviewed:  I have personally reviewed and interpreted labs, imaging.  Significant findings are   CBC: Recent Labs  Lab 05/25/23 0832 05/26/23 1434 05/27/23 0409 05/27/23 0929  WBC 10.9* 8.6 10.8* 13.4*  NEUTROABS 6.1 5.1 9.7* 11.6*  HGB 14.0 13.7 13.1 12.7*  HCT 42.4 41.6 40.4 40.2  MCV 83.8 85.1 86.0 89.1  PLT 565* 544* 508* 482*   Basic Metabolic Panel: Recent Labs  Lab 05/25/23 0832 05/27/23 0409  NA 137 134*  K 3.5 4.0  CL 107 100  CO2 21* 24  GLUCOSE 100* 132*  BUN 14 10  CREATININE 0.85 0.66  CALCIUM 9.1 8.9  MG  --  2.1  PHOS  --  3.2   GFR: Estimated Creatinine Clearance: 95.2 mL/min (by C-G formula based on SCr of 0.66 mg/dL). Liver Function Tests: Recent Labs  Lab 05/25/23 0832 05/27/23 0409  AST 45* 44*  ALT 47* 43  ALKPHOS 72 65  BILITOT 0.8 0.5  PROT 7.8 7.9  ALBUMIN 3.5 3.3*    Code Status:  Full code.  Code status decision has been confirmed with: patient Family Communication: None    Severity of Illness:  The appropriate patient status for this patient is INPATIENT. Inpatient status is judged to be reasonable and necessary in order to provide the required intensity of service to ensure the patient's safety. The patient's presenting symptoms, physical exam findings, and initial radiographic and laboratory data in the context of their chronic comorbidities is felt to place them at high risk for further clinical deterioration. Furthermore, it is not anticipated that the patient will be medically stable for discharge from the hospital within 2 midnights of admission.   * I certify that at the point of admission it is my clinical judgment  that the patient will require inpatient hospital care spanning beyond 2 midnights from the point of admission due to high intensity of service, high risk for further deterioration and high frequency of surveillance required.*  Time spent:  50 minutes  Author:  Marinda Elk MD  05/27/2023 1:08 PM

## 2023-05-27 NOTE — Progress Notes (Signed)
Pharmacy Antibiotic Note  Mark Porter is a 57 y.o. male admitted on 05/25/2023 with UTI, prostate abscess, s/p TURP on 11/14.  Pharmacy has been consulted for Vancomycin dosing for recent MRSA urine culture.  Ceftriaxone per MD for GNR in most recent urine culture.    Plan: Vancomycin 1500 mg IV x1 then 1000 mg IV q12h (SCr rounded 0.8, est AUC 448) Measure Vanc levels as needed.  Goal AUC = 400 - 550 Follow up renal function, culture results, and clinical course.   Height: 5\' 7"  (170.2 cm) Weight: 74.3 kg (163 lb 12.8 oz) IBW/kg (Calculated) : 66.1  Temp (24hrs), Avg:97.9 F (36.6 C), Min:97.6 F (36.4 C), Max:98.4 F (36.9 C)  Recent Labs  Lab 05/25/23 0832 05/26/23 1434 05/27/23 0409 05/27/23 0929  WBC 10.9* 8.6 10.8* 13.4*  CREATININE 0.85  --  0.66  --     Estimated Creatinine Clearance: 95.2 mL/min (by C-G formula based on SCr of 0.66 mg/dL).    Allergies  Allergen Reactions   Carisoprodol Other (See Comments)    Soma. Urinary retention    Antimicrobials this admission: 11/14 Clindamycin >> 11/15 11/15 Ceftriaxone >>  11/15 Vancomycin >>   Dose adjustments this admission:   Microbiology results: 10/25 UCx: >100k MRSA 11/14 UCx: > 100k GNR  Thank you for allowing pharmacy to be a part of this patient's care.  Lynann Beaver PharmD, BCPS [facility] main pharmacy [phone] 05/27/2023 1:16 PM

## 2023-05-27 NOTE — Progress Notes (Addendum)
1 Day Post-Op Subjective: NAEON.  Patient transferred to the floor for recovery and monitoring.  No intraoperative complications noted.  Having breakfast on arrival.  His pain is significantly improved but still requiring medication.  Objective: Vital signs in last 24 hours: Temp:  [97.6 F (36.4 C)-98.4 F (36.9 C)] 98.1 F (36.7 C) (11/15 0757) Pulse Rate:  [58-85] 72 (11/15 0757) Resp:  [10-30] 17 (11/15 0757) BP: (104-159)/(65-95) 108/74 (11/15 0757) SpO2:  [91 %-100 %] 93 % (11/15 0757) Weight:  [74.3 kg] 74.3 kg (11/15 0757)  Assessment/Plan: # Prostate abscess CT A/P on 05/25/2023 notes multiple hypoattenuating foci in the prostate gland-up to 2.5 cm. To the OR for TURP with Dr. Cardell Peach and Dr. Youlanda Mighty later 05/26/2023. Foley catheter in place draining clear yellow urine.  To stay in place for now.  Will continue trial of void at the beginning of the week.  Intake/Output from previous day: 11/14 0701 - 11/15 0700 In: 2236.1 [P.O.:1380; I.V.:723.3; IV Piggyback:132.7] Out: 500 [Urine:500]  Intake/Output this shift: Total I/O In: 220 [P.O.:220] Out: -   Physical Exam:  General: Alert and oriented CV: No cyanosis Lungs: equal chest rise Gu: Foley catheter in place draining clear yellow urine.  Lab Results: Recent Labs    05/26/23 1434 05/27/23 0409 05/27/23 0929  HGB 13.7 13.1 12.7*  HCT 41.6 40.4 40.2   BMET Recent Labs    05/25/23 0832 05/27/23 0409  NA 137 134*  K 3.5 4.0  CL 107 100  CO2 21* 24  GLUCOSE 100* 132*  BUN 14 10  CREATININE 0.85 0.66  CALCIUM 9.1 8.9     Studies/Results: CT ABDOMEN PELVIS W CONTRAST  Result Date: 05/25/2023 CLINICAL DATA:  Abdominal pain, acute, nonlocalized mult prior surg, now w diffuse pain, anorexia. EXAM: CT ABDOMEN AND PELVIS WITH CONTRAST TECHNIQUE: Multidetector CT imaging of the abdomen and pelvis was performed using the standard protocol following bolus administration of intravenous contrast. RADIATION  DOSE REDUCTION: This exam was performed according to the departmental dose-optimization program which includes automated exposure control, adjustment of the mA and/or kV according to patient size and/or use of iterative reconstruction technique. CONTRAST:  75mL ISOVUE-370 IOPAMIDOL (ISOVUE-370) INJECTION 76% COMPARISON:  CT scan abdomen and pelvis from 05/06/2023. FINDINGS: Lower chest: There are subpleural atelectatic changes in the visualized lung bases. No overt consolidation. No pleural effusion. The heart is normal in size. No pericardial effusion. Hepatobiliary: The liver is normal in size. Non-cirrhotic configuration. No suspicious mass. These is mild diffuse hepatic steatosis. No intrahepatic or extrahepatic bile duct dilation. No calcified gallstones. Normal gallbladder wall thickness. No pericholecystic inflammatory changes. Pancreas: Unremarkable. No pancreatic ductal dilatation or surrounding inflammatory changes. 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 Adrenals/Urinary Tract: Adrenal glands are unremarkable. No suspicious renal mass. No hydronephrosis. No renal or ureteric calculi. Urinary bladder is decompressed secondary to Foley catheter. Stomach/Bowel: There is mild circumferential thickening of the lower thoracic esophagus, which is most likely seen in the settings of chronic gastroesophageal reflux disease versus esophagitis. No disproportionate dilation of the small or large bowel loops. No evidence of abnormal bowel wall thickening or inflammatory changes. The appendix is unremarkable. Vascular/Lymphatic: No ascites or pneumoperitoneum. No abdominal or pelvic lymphadenopathy, by size criteria. No aneurysmal dilation of the major abdominal arteries. There are mild peripheral atherosclerotic vascular calcifications of the aorta and its major branches. Reproductive: Mildly enlarged prostate gland. There are several hypoattenuating foci in the  prostate gland with largest  measuring up to 1.4 x 2.5 cm which exhibits incomplete peripheral hyperattenuating wall. Differential diagnosis includes prostatic abscess versus prostatic cysts. Correlate clinically and with urinalysis. Other: There are several midline supraumbilical fat containing ventral hernias. No herniation of bowel loop. There is also a small fat containing left inguinal hernia. The soft tissues and abdominal wall are otherwise unremarkable. Musculoskeletal: No suspicious osseous lesions. There are mild - moderate multilevel degenerative changes in the visualized spine. 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 There is mild anterior wedging deformity of T12 vertebral body which also exhibits superior endplate Schmorl's node. No significant interval change. IMPRESSION: *There are several hypoattenuating foci in the prostate, as described above, which may represent prostatic abscess versus prostatic cysts. Correlate clinically and with urinalysis. *Multiple other nonacute observations, as described above. Electronically Signed   By: Jules Schick M.D.   On: 05/25/2023 15:27      LOS: 1 day   [name], NP [facility] Pager: [phone]  05/27/2023, 10:40 AM   I have seen and examined the patient and agree with the above assessment and plan.  Keep foley catheter in place for maximal drainage. Plan for void trial Monday AM. Continue broad spectrum abx. F/u urine culture. Plan for at least 3 weeks PO abx outpatient.  Matt R. Braedin Millhouse MD [facility]  Pager: [phone]

## 2023-05-27 NOTE — Plan of Care (Signed)
  Problem: Fluid Volume: Goal: Hemodynamic stability will improve Outcome: Progressing   Problem: Clinical Measurements: Goal: Diagnostic test results will improve Outcome: Progressing Goal: Signs and symptoms of infection will decrease Outcome: Progressing   Problem: Respiratory: Goal: Ability to maintain adequate ventilation will improve Outcome: Progressing   Problem: Education: Goal: Knowledge of General Education information will improve Description: Including pain rating scale, medication(s)/side effects and non-pharmacologic comfort measures Outcome: Progressing   Problem: Health Behavior/Discharge Planning: Goal: Ability to manage health-related needs will improve Outcome: Progressing   Problem: Clinical Measurements: Goal: Ability to maintain clinical measurements within normal limits will improve Outcome: Progressing

## 2023-05-27 NOTE — Hospital Course (Signed)
57 year old male with past medical history of chronic pain syndrome, urinary retention presenting to [facility] emergency department with penile and suprapubic pain as well as left flank pain for approximately 1 week.  Patient also happened to be reporting suicidal ideation on arrival.  Upon evaluation in the emergency department patient found to be febrile with temperature of 38.3 F.  Urinalysis was suggestive of urinary tract infection with CT imaging of the abdomen and pelvis concerning for prostatic abscesses measuring up to 1.4 x 2.5 cm.  The hospitalist group was then called to assess the patient for admission to the hospital.  Blood and urine cultures were obtained.  Patient was initiated on intravenous antibiotics with Rocephin and clindamycin.  Dr. Cardell Peach with urology was consulted who recommended TURP with unroofing of the prostatic abscess.  Patient underwent TURP evening of 11/14 which was successful.  Cultures were sent off and are pending.  Considering recent urine culture growing out MRSA on 10/25, patient was transitioned to intravenous vancomycin on 11/15.  Concerning the patient's suicidal ideation, patient was evaluated by psychiatry and it was felt the patient was not at risk of harming himself.  Patient was initiated on Cymbalta and recommended close outpatient follow-up.

## 2023-05-27 NOTE — Anesthesia Postprocedure Evaluation (Signed)
Anesthesia Post Note  Patient: Mark Porter  Procedure(s) Performed: TRANSURETHRAL RESECTION OF THE PROSTATE (TURP)     Anesthesia Type: General Anesthetic complications: no   No notable events documented.  Last Vitals:  Vitals:   05/27/23 0339 05/27/23 0757  BP: 104/65 108/74  Pulse: 85 72  Resp: 20 17  Temp: 36.7 C 36.7 C  SpO2: 93% 93%    Last Pain:  Vitals:   05/27/23 0757  TempSrc: Oral  PainSc:                  Trevor Iha

## 2023-05-28 DIAGNOSIS — R338 Other retention of urine: Secondary | ICD-10-CM | POA: Diagnosis not present

## 2023-05-28 DIAGNOSIS — F1910 Other psychoactive substance abuse, uncomplicated: Secondary | ICD-10-CM | POA: Diagnosis not present

## 2023-05-28 DIAGNOSIS — N412 Abscess of prostate: Principal | ICD-10-CM

## 2023-05-28 DIAGNOSIS — N390 Urinary tract infection, site not specified: Secondary | ICD-10-CM | POA: Diagnosis not present

## 2023-05-28 DIAGNOSIS — F1410 Cocaine abuse, uncomplicated: Secondary | ICD-10-CM

## 2023-05-28 DIAGNOSIS — R45851 Suicidal ideations: Secondary | ICD-10-CM | POA: Diagnosis not present

## 2023-05-28 LAB — CBC WITH DIFFERENTIAL/PLATELET
Abs Immature Granulocytes: 0.06 10*3/uL (ref 0.00–0.07)
Basophils Absolute: 0.1 10*3/uL (ref 0.0–0.1)
Basophils Relative: 0 %
Eosinophils Absolute: 0 10*3/uL (ref 0.0–0.5)
Eosinophils Relative: 0 %
HCT: 38.7 % — ABNORMAL LOW (ref 39.0–52.0)
Hemoglobin: 12.2 g/dL — ABNORMAL LOW (ref 13.0–17.0)
Immature Granulocytes: 0 %
Lymphocytes Relative: 21 %
Lymphs Abs: 3.1 10*3/uL (ref 0.7–4.0)
MCH: 28.1 pg (ref 26.0–34.0)
MCHC: 31.5 g/dL (ref 30.0–36.0)
MCV: 89.2 fL (ref 80.0–100.0)
Monocytes Absolute: 1.5 10*3/uL — ABNORMAL HIGH (ref 0.1–1.0)
Monocytes Relative: 10 %
Neutro Abs: 9.8 10*3/uL — ABNORMAL HIGH (ref 1.7–7.7)
Neutrophils Relative %: 69 %
Platelets: 477 10*3/uL — ABNORMAL HIGH (ref 150–400)
RBC: 4.34 MIL/uL (ref 4.22–5.81)
RDW: 15 % (ref 11.5–15.5)
WBC: 14.5 10*3/uL — ABNORMAL HIGH (ref 4.0–10.5)
nRBC: 0 % (ref 0.0–0.2)

## 2023-05-28 LAB — COMPREHENSIVE METABOLIC PANEL
ALT: 38 U/L (ref 0–44)
AST: 35 U/L (ref 15–41)
Albumin: 3.1 g/dL — ABNORMAL LOW (ref 3.5–5.0)
Alkaline Phosphatase: 58 U/L (ref 38–126)
Anion gap: 7 (ref 5–15)
BUN: 13 mg/dL (ref 6–20)
CO2: 23 mmol/L (ref 22–32)
Calcium: 8.3 mg/dL — ABNORMAL LOW (ref 8.9–10.3)
Chloride: 101 mmol/L (ref 98–111)
Creatinine, Ser: 0.6 mg/dL — ABNORMAL LOW (ref 0.61–1.24)
GFR, Estimated: >60 mL/min (ref >60)
Glucose, Bld: 103 mg/dL — ABNORMAL HIGH (ref 70–99)
Potassium: 3.8 mmol/L (ref 3.5–5.1)
Sodium: 131 mmol/L — ABNORMAL LOW (ref 135–145)
Total Bilirubin: 0.4 mg/dL (ref <1.2)
Total Protein: 6.9 g/dL (ref 6.5–8.1)

## 2023-05-28 LAB — MAGNESIUM: Magnesium: 2.1 mg/dL (ref 1.7–2.4)

## 2023-05-28 LAB — PHOSPHORUS: Phosphorus: 3 mg/dL (ref 2.5–4.6)

## 2023-05-28 MED ORDER — ENSURE ENLIVE PO LIQD
237.0000 mL | Freq: Two times a day (BID) | ORAL | Status: DC
  Administered 2023-05-28 – 2023-05-30 (×6): 237 mL via ORAL

## 2023-05-28 MED ORDER — OXYCODONE HCL 5 MG PO TABS
10.0000 mg | ORAL_TABLET | ORAL | Status: DC | PRN
  Administered 2023-05-28 – 2023-05-31 (×15): 10 mg via ORAL
  Filled 2023-05-28 (×15): qty 2

## 2023-05-28 NOTE — Assessment & Plan Note (Signed)
Evaluated by Dr. Viviano Simas, patient felt to not be at imminent risk to self or others at present Patient placed on Cymbalta 30 mg twice daily for associated depression
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
## 2023-05-28 NOTE — Assessment & Plan Note (Signed)
MRSA UTI identified on urine culture 10/25 More recent urine culture on 11/14 once again growing out Staphylococcus aureus and Providencia rettgeri with sensitivities pending Remainder of assessment and plan as above

## 2023-05-28 NOTE — Progress Notes (Signed)
Urology Inpatient Progress Note  Subjective: POD #2 s/p TUR for prostate abscess Patient states that he feels better this am. No fever  Anti-infectives: Anti-infectives (From admission, onward)    Start     Dose/Rate Route Frequency Ordered Stop   05/28/23 0400  vancomycin (VANCOCIN) IVPB 1000 mg/200 mL premix        1,000 mg 200 mL/hr over 60 Minutes Intravenous Every 12 hours 05/27/23 1610     05/27/23 1600  cefTRIAXone (ROCEPHIN) 2 g in sodium chloride 0.9 % 100 mL IVPB        2 g 200 mL/hr over 30 Minutes Intravenous Daily 05/27/23 1458     05/27/23 1400  vancomycin (VANCOREADY) IVPB 1500 mg/300 mL        1,500 mg 150 mL/hr over 120 Minutes Intravenous  Once 05/27/23 1310 05/27/23 1814   05/26/23 1747  sodium chloride 0.9 % with cefTRIAXone (ROCEPHIN) ADS Med       Note to Pharmacy: Minor, Anneita S: cabinet override      05/26/23 1747 05/27/23 0559   05/26/23 1200  clindamycin (CLEOCIN) IVPB 600 mg  Status:  Discontinued        600 mg 100 mL/hr over 30 Minutes Intravenous Every 8 hours 05/26/23 0505 05/27/23 1307   05/26/23 0130  clindamycin (CLEOCIN) IVPB 600 mg        600 mg 100 mL/hr over 30 Minutes Intravenous  Once 05/26/23 0116 05/26/23 0351   05/26/23 0130  cefTRIAXone (ROCEPHIN) 2 g in sodium chloride 0.9 % 100 mL IVPB        2 g 200 mL/hr over 30 Minutes Intravenous  Once 05/26/23 0126 05/26/23 0319   05/26/23 0115  cefTRIAXone (ROCEPHIN) 2 g in sodium chloride 0.9 % 100 mL IVPB  Status:  Discontinued        2 g 200 mL/hr over 30 Minutes Intravenous  Once 05/26/23 0108 05/26/23 0110   05/25/23 1600  clindamycin (CLEOCIN) capsule 300 mg  Status:  Discontinued        300 mg Oral 3 times daily 05/25/23 1558 05/26/23 0118   05/25/23 1600  fluconazole (DIFLUCAN) tablet 150 mg        150 mg Oral  Once 05/25/23 1558 05/25/23 1612       Current Facility-Administered Medications  Medication Dose Route Frequency Provider Last Rate Last Admin   acetaminophen (TYLENOL)  tablet 650 mg  650 mg Oral Q6H PRN Dezii, Alexandra, DO       Or   acetaminophen (TYLENOL) suppository 650 mg  650 mg Rectal Q6H PRN Dezii, Alexandra, DO       ARIPiprazole (ABILIFY) tablet 5 mg  5 mg Oral Daily Mariel Craft, MD   5 mg at 05/28/23 0910   baclofen (LIORESAL) tablet 10 mg  10 mg Oral TID Debarah Crape, DO   10 mg at 05/28/23 0910   bisacodyl (DULCOLAX) EC tablet 5 mg  5 mg Oral Daily PRN Dezii, Alexandra, DO       cefTRIAXone (ROCEPHIN) 2 g in sodium chloride 0.9 % 100 mL IVPB  2 g Intravenous Daily Shalhoub, Deno Lunger, MD 200 mL/hr at 05/28/23 0918 2 g at 05/28/23 0918   Chlorhexidine Gluconate Cloth 2 % PADS 6 each  6 each Topical Daily Dezii, Alexandra, DO   6 each at 05/27/23 1000   diclofenac Sodium (VOLTAREN) 1 % topical gel 2 g  2 g Topical QID Marinda Elk, MD   2 g at 05/28/23 [phone]  diphenhydrAMINE (BENADRYL) capsule 25 mg  25 mg Oral Q6H PRN Dezii, Alexandra, DO   25 mg at 05/28/23 0244   DULoxetine (CYMBALTA) DR capsule 30 mg  30 mg Oral BID Dezii, Alexandra, DO   30 mg at 05/28/23 0910   enoxaparin (LOVENOX) injection 40 mg  40 mg Subcutaneous Daily Dezii, Alexandra, DO   40 mg at 05/28/23 0910   feeding supplement (ENSURE ENLIVE / ENSURE PLUS) liquid 237 mL  237 mL Oral BID BM Shalhoub, Deno Lunger, MD       HYDROmorphone (DILAUDID) injection 0.5-1 mg  0.5-1 mg Intravenous Q3H PRN Dezii, Alexandra, DO   1 mg at 05/28/23 0911   metoCLOPramide (REGLAN) injection 10 mg  10 mg Intravenous Q8H PRN Dezii, Alexandra, DO       ondansetron (ZOFRAN) tablet 4 mg  4 mg Oral Q6H PRN Dezii, Alexandra, DO       Or   ondansetron (ZOFRAN) injection 4 mg  4 mg Intravenous Q6H PRN Dezii, Alexandra, DO   4 mg at 05/26/23 2319   oxyCODONE (Oxy IR/ROXICODONE) immediate release tablet 5 mg  5 mg Oral Q4H PRN Dezii, Alexandra, DO   5 mg at 05/28/23 0835   polyethylene glycol (MIRALAX / GLYCOLAX) packet 17 g  17 g Oral Daily PRN Dezii, Alexandra, DO       vancomycin (VANCOCIN) IVPB  1000 mg/200 mL premix  1,000 mg Intravenous Q12H Ellington, Abby K, RPH 200 mL/hr at 05/28/23 0425 1,000 mg at 05/28/23 0425     Objective: Vital signs in last 24 hours: Temp:  [97.9 F (36.6 C)-98.7 F (37.1 C)] 97.9 F (36.6 C) (11/16 0416) Pulse Rate:  [72-82] 77 (11/16 0416) Resp:  [18-20] 20 (11/16 0416) BP: (101-119)/(53-71) 101/71 (11/16 0416) SpO2:  [87 %-93 %] 91 % (11/16 0416)  Intake/Output from previous day: 11/15 0701 - 11/16 0700 In: 1869.4 [P.O.:1270; IV Piggyback:599.4] Out: 550 [Urine:550] Intake/Output this shift: No intake/output data recorded.  GENERAL APPEARANCE:  Well appearing, well developed, well nourished, NAD    Lab Results:  Recent Labs    05/27/23 0929 05/28/23 0612  WBC 13.4* 14.5*  HGB 12.7* 12.2*  HCT 40.2 38.7*  PLT 482* 477*   BMET Recent Labs    05/27/23 0409 05/28/23 0612  NA 134* 131*  K 4.0 3.8  CL 100 101  CO2 24 23  GLUCOSE 132* 103*  BUN 10 13  CREATININE 0.66 0.60*  CALCIUM 8.9 8.3*   PT/INR No results for input(s): "LABPROT", "INR" in the last 72 hours. ABG No results for input(s): "PHART", "HCO3" in the last 72 hours.  Invalid input(s): "PCO2", "PO2"  Studies/Results: No results found.   Assessment & Plan: POD #2 s/p TUR drainage of prostatic abcess Keep foley catheter in place with plans for void trial Monday AM. Continue broad spectrum abx. F/u urine culture. Plan for at least 3 weeks PO abx outpatient.   Joline Maxcy, MD 05/28/2023

## 2023-05-28 NOTE — Assessment & Plan Note (Signed)
Foley catheter remains in place Plan voiding trial on 11/18

## 2023-05-28 NOTE — Assessment & Plan Note (Signed)
Urine toxicology screen on 11/13 revealing cocaine and THC use Counseling on cessation daily

## 2023-05-28 NOTE — Plan of Care (Signed)
  Problem: Education: Goal: Knowledge of General Education information will improve Description: Including pain rating scale, medication(s)/side effects and non-pharmacologic comfort measures Outcome: Progressing   Problem: Nutrition: Goal: Adequate nutrition will be maintained Outcome: Progressing   Problem: Coping: Goal: Level of anxiety will decrease Outcome: Progressing   Problem: Safety: Goal: Ability to remain free from injury will improve Outcome: Progressing   Problem: Pain Management: Goal: General experience of comfort will improve Outcome: Not Progressing

## 2023-05-28 NOTE — Assessment & Plan Note (Signed)
Pain slowly improving, encouraging patient to begin to transition from intravenous to oral analgesics in preparation for eventual discharge. status post TURP with drainage of prostatic abscess on 11/14 by Dr. Cardell Peach Concerning organism, recent urine culture 10/25 grew out MRSA with more recent urine culture on 11/14 once again growing out Staphylococcus aureus and Providencia rettgeri with sensitivities pending Currently treated with intravenous ceftriaxone and vancomycin Foley catheter remains in place postoperatively with planned voiding trial on Monday 11/18 Urology following, their input is appreciated

## 2023-05-28 NOTE — Plan of Care (Signed)
  Problem: Fluid Volume: Goal: Hemodynamic stability will improve Outcome: Progressing   Problem: Clinical Measurements: Goal: Diagnostic test results will improve Outcome: Progressing Goal: Signs and symptoms of infection will decrease Outcome: Progressing   Problem: Respiratory: Goal: Ability to maintain adequate ventilation will improve Outcome: Progressing   Problem: Education: Goal: Knowledge of General Education information will improve Description: Including pain rating scale, medication(s)/side effects and non-pharmacologic comfort measures Outcome: Progressing   Problem: Health Behavior/Discharge Planning: Goal: Ability to manage health-related needs will improve Outcome: Progressing   Problem: Clinical Measurements: Goal: Ability to maintain clinical measurements within normal limits will improve Outcome: Progressing Goal: Will remain free from infection Outcome: Progressing Goal: Diagnostic test results will improve Outcome: Progressing

## 2023-05-28 NOTE — Progress Notes (Signed)
Discussed with the patient the importance of taking Miraklax to help prevent constipation due to treatment of pain with Opioid use. Patient reports last BM on 11/14. Patient stated would take Miralax if does not have BM tonight. Passed this on to night shift RN as well.

## 2023-05-28 NOTE — Progress Notes (Signed)
PROGRESS NOTE   CINDY AUSTGEN  NGE:[id number] DOB: 1965/12/28 DOA: 05/25/2023 PCP: Devra Dopp, MD   Date of Service: the patient was seen and examined on 05/28/2023  Brief Narrative:  57 year old male with past medical history of chronic pain syndrome, urinary retention presenting to [facility] emergency department with penile and suprapubic pain as well as left flank pain for approximately 1 week.  Patient also happened to be reporting suicidal ideation on arrival.  Upon evaluation in the emergency department patient found to be febrile with temperature of 38.3 F.  Urinalysis was suggestive of urinary tract infection with CT imaging of the abdomen and pelvis concerning for prostatic abscesses measuring up to 1.4 x 2.5 cm.  The hospitalist group was then called to assess the patient for admission to the hospital.  Blood and urine cultures were obtained.  Patient was initiated on intravenous antibiotics with Rocephin and clindamycin.  Dr. Cardell Peach with urology was consulted who recommended TURP with unroofing of the prostatic abscess.  Patient underwent TURP evening of 11/14 which was successful.  Cultures were sent off and are pending.  Considering recent urine culture growing out MRSA on 10/25, patient was transitioned to intravenous vancomycin on 11/15.  Concerning the patient's suicidal ideation, patient was evaluated by psychiatry and it was felt the patient was not at risk of harming himself.  Patient was initiated on Cymbalta and recommended close outpatient follow-up.     Assessment & Plan Prostate abscess Pain slowly improving, encouraging patient to begin to transition from intravenous to oral analgesics in preparation for eventual discharge. status post TURP with drainage of prostatic abscess on 11/14 by Dr. Cardell Peach Concerning organism, recent urine culture 10/25 grew out MRSA with more recent urine culture on 11/14 once again growing out Staphylococcus aureus and  Providencia rettgeri with sensitivities pending Currently treated with intravenous ceftriaxone and vancomycin Foley catheter remains in place postoperatively with planned voiding trial on Monday 11/18 Urology following, their input is appreciated Acute UTI MRSA UTI identified on urine culture 10/25 More recent urine culture on 11/14 once again growing out Staphylococcus aureus and Providencia rettgeri with sensitivities pending Remainder of assessment and plan as above Polysubstance abuse (HCC) Urine toxicology screen on 11/13 revealing cocaine and THC use Counseling on cessation daily Acute urinary retention Foley catheter remains in place Plan voiding trial on 11/18 Suicidal ideation Evaluated by Dr. Viviano Simas, patient felt to not be at imminent risk to self or others at present Patient placed on Cymbalta 30 mg twice daily for associated depression     Subjective:  Patient continues to complain of lower abdominal pain and groin pain however states that this is somewhat improved since yesterday.  Pain is moderate in intensity, sharp in quality and worse with movement.  Physical Exam:  Vitals:   05/27/23 2034 05/28/23 0416 05/28/23 1304 05/28/23 2110  BP: (!) 107/53 101/71 107/68 107/68  Pulse: 82 77 68 73  Resp: 20 20 18 20   Temp: 98.1 F (36.7 C) 97.9 F (36.6 C) 98.1 F (36.7 C) 98.6 F (37 C)  TempSrc:   Oral   SpO2: (!) 87% 91% 92% 93%  Weight:      Height:        Constitutional: Awake alert and oriented x3, patient is in distress due to abdominal pain. Skin: no rashes, no lesions, good skin turgor noted. Eyes: Pupils are equally reactive to light.  No evidence of scleral icterus or conjunctival pallor.  ENMT: Moist mucous membranes noted.  Posterior pharynx clear of any exudate or lesions.   Respiratory: clear to auscultation bilaterally, no wheezing, no crackles. Normal respiratory effort. No accessory muscle use.  Cardiovascular: Regular rate and rhythm, no murmurs  / rubs / gallops. No extremity edema. 2+ pedal pulses. No carotid bruits.  Abdomen: Lower abdominal tenderness.  Abdomen is soft.  No evidence of intra-abdominal masses.  Positive bowel sounds noted in all quadrants.   Musculoskeletal: No joint deformity upper and lower extremities. Good ROM, no contractures. Normal muscle tone.  GU: Foley catheter in place draining slightly bloody urine   Data Reviewed:  I have personally reviewed and interpreted labs, imaging.  Significant findings are   CBC: Recent Labs  Lab 05/25/23 0832 05/26/23 1434 05/27/23 0409 05/27/23 0929 05/28/23 0612  WBC 10.9* 8.6 10.8* 13.4* 14.5*  NEUTROABS 6.1 5.1 9.7* 11.6* 9.8*  HGB 14.0 13.7 13.1 12.7* 12.2*  HCT 42.4 41.6 40.4 40.2 38.7*  MCV 83.8 85.1 86.0 89.1 89.2  PLT 565* 544* 508* 482* 477*   Basic Metabolic Panel: Recent Labs  Lab 05/25/23 0832 05/27/23 0409 05/28/23 0612  NA 137 134* 131*  K 3.5 4.0 3.8  CL 107 100 101  CO2 21* 24 23  GLUCOSE 100* 132* 103*  BUN 14 10 13   CREATININE 0.85 0.66 0.60*  CALCIUM 9.1 8.9 8.3*  MG  --  2.1 2.1  PHOS  --  3.2 3.0   GFR: Estimated Creatinine Clearance: 95.2 mL/min (A) (by C-G formula based on SCr of 0.6 mg/dL (L)). Liver Function Tests: Recent Labs  Lab 05/25/23 0832 05/27/23 0409 05/28/23 0612  AST 45* 44* 35  ALT 47* 43 38  ALKPHOS 72 65 58  BILITOT 0.8 0.5 0.4  PROT 7.8 7.9 6.9  ALBUMIN 3.5 3.3* 3.1*    Code Status:  Full code.  Code status decision has been confirmed with: patient Family Communication: None    Severity of Illness:  The appropriate patient status for this patient is INPATIENT. Inpatient status is judged to be reasonable and necessary in order to provide the required intensity of service to ensure the patient's safety. The patient's presenting symptoms, physical exam findings, and initial radiographic and laboratory data in the context of their chronic comorbidities is felt to place them at high risk for further  clinical deterioration. Furthermore, it is not anticipated that the patient will be medically stable for discharge from the hospital within 2 midnights of admission.   * I certify that at the point of admission it is my clinical judgment that the patient will require inpatient hospital care spanning beyond 2 midnights from the point of admission due to high intensity of service, high risk for further deterioration and high frequency of surveillance required.*  Time spent:  36 minutes  Author:  Marinda Elk MD  05/28/2023 9:45 PM

## 2023-05-29 DIAGNOSIS — N390 Urinary tract infection, site not specified: Secondary | ICD-10-CM | POA: Diagnosis not present

## 2023-05-29 DIAGNOSIS — R45851 Suicidal ideations: Secondary | ICD-10-CM | POA: Diagnosis not present

## 2023-05-29 DIAGNOSIS — F1410 Cocaine abuse, uncomplicated: Secondary | ICD-10-CM | POA: Diagnosis not present

## 2023-05-29 DIAGNOSIS — R338 Other retention of urine: Secondary | ICD-10-CM | POA: Diagnosis not present

## 2023-05-29 LAB — CBC WITH DIFFERENTIAL/PLATELET
Abs Immature Granulocytes: 0.05 10*3/uL (ref 0.00–0.07)
Basophils Absolute: 0.1 10*3/uL (ref 0.0–0.1)
Basophils Relative: 1 %
Eosinophils Absolute: 0.2 10*3/uL (ref 0.0–0.5)
Eosinophils Relative: 2 %
HCT: 38.6 % — ABNORMAL LOW (ref 39.0–52.0)
Hemoglobin: 12.7 g/dL — ABNORMAL LOW (ref 13.0–17.0)
Immature Granulocytes: 1 %
Lymphocytes Relative: 35 %
Lymphs Abs: 3.3 10*3/uL (ref 0.7–4.0)
MCH: 28.7 pg (ref 26.0–34.0)
MCHC: 32.9 g/dL (ref 30.0–36.0)
MCV: 87.1 fL (ref 80.0–100.0)
Monocytes Absolute: 1.1 10*3/uL — ABNORMAL HIGH (ref 0.1–1.0)
Monocytes Relative: 12 %
Neutro Abs: 4.6 10*3/uL (ref 1.7–7.7)
Neutrophils Relative %: 49 %
Platelets: 452 10*3/uL — ABNORMAL HIGH (ref 150–400)
RBC: 4.43 MIL/uL (ref 4.22–5.81)
RDW: 15 % (ref 11.5–15.5)
WBC: 9.3 10*3/uL (ref 4.0–10.5)
nRBC: 0 % (ref 0.0–0.2)

## 2023-05-29 LAB — COMPREHENSIVE METABOLIC PANEL
ALT: 51 U/L — ABNORMAL HIGH (ref 0–44)
AST: 55 U/L — ABNORMAL HIGH (ref 15–41)
Albumin: 3 g/dL — ABNORMAL LOW (ref 3.5–5.0)
Alkaline Phosphatase: 70 U/L (ref 38–126)
Anion gap: 8 (ref 5–15)
BUN: 10 mg/dL (ref 6–20)
CO2: 24 mmol/L (ref 22–32)
Calcium: 8.5 mg/dL — ABNORMAL LOW (ref 8.9–10.3)
Chloride: 102 mmol/L (ref 98–111)
Creatinine, Ser: 0.6 mg/dL — ABNORMAL LOW (ref 0.61–1.24)
GFR, Estimated: >60 mL/min (ref >60)
Glucose, Bld: 109 mg/dL — ABNORMAL HIGH (ref 70–99)
Potassium: 3.9 mmol/L (ref 3.5–5.1)
Sodium: 134 mmol/L — ABNORMAL LOW (ref 135–145)
Total Bilirubin: 0.4 mg/dL (ref <1.2)
Total Protein: 6.9 g/dL (ref 6.5–8.1)

## 2023-05-29 LAB — MAGNESIUM: Magnesium: 1.9 mg/dL (ref 1.7–2.4)

## 2023-05-29 LAB — URINE CULTURE: Culture: >=100000 — AB

## 2023-05-29 LAB — PHOSPHORUS: Phosphorus: 3.3 mg/dL (ref 2.5–4.6)

## 2023-05-29 MED ORDER — SODIUM CHLORIDE 0.9 % IV SOLN
2.0000 g | Freq: Three times a day (TID) | INTRAVENOUS | Status: DC
  Administered 2023-05-29 – 2023-05-30 (×3): 2 g via INTRAVENOUS
  Filled 2023-05-29 (×3): qty 12.5

## 2023-05-29 MED ORDER — HYDROMORPHONE HCL 1 MG/ML IJ SOLN
0.5000 mg | INTRAMUSCULAR | Status: DC | PRN
  Administered 2023-05-29 – 2023-05-31 (×12): 0.5 mg via INTRAVENOUS
  Filled 2023-05-29 (×13): qty 0.5

## 2023-05-29 MED ORDER — ORAL CARE MOUTH RINSE: 15.0000 mL | OROMUCOSAL | Status: DC | PRN

## 2023-05-29 NOTE — Assessment & Plan Note (Signed)
Evaluated by Dr. Viviano Simas, patient felt to not be at imminent risk to self or others at present Patient placed on Cymbalta 30 mg twice daily for associated depression

## 2023-05-29 NOTE — Assessment & Plan Note (Signed)
Urine toxicology screen on 11/13 revealing cocaine and THC use Counseling on cessation daily

## 2023-05-29 NOTE — Progress Notes (Incomplete)
PROGRESS NOTE   Mark Porter  DGU:[id number] DOB: [birth year] DOA: 05/25/2023 PCP: Devra Dopp, MD   Date of Service: the patient was seen and examined on 05/29/2023  Brief Narrative:  57 year old male with past medical history of chronic pain syndrome, urinary retention presenting to [facility] emergency department with penile and suprapubic pain as well as left flank pain for approximately 1 week.  Patient also happened to be reporting suicidal ideation on arrival.  Upon evaluation in the emergency department patient found to be febrile with temperature of 38.3 F.  Urinalysis was suggestive of urinary tract infection with CT imaging of the abdomen and pelvis concerning for prostatic abscesses measuring up to 1.4 x 2.5 cm.  The hospitalist group was then called to assess the patient for admission to the hospital.  Blood and urine cultures were obtained.  Patient was initiated on intravenous antibiotics with Rocephin and clindamycin.  Dr. Cardell Peach with urology was consulted who recommended TURP with unroofing of the prostatic abscess.  Patient underwent TURP evening of 11/14 which was successful.  Cultures were sent off and are pending.  Considering recent urine culture growing out MRSA on 10/25, patient was transitioned to intravenous vancomycin on 11/15.  Concerning the patient's suicidal ideation, patient was evaluated by psychiatry and it was felt the patient was not at risk of harming himself.  Patient was initiated on Cymbalta and recommended close outpatient follow-up.     Assessment & Plan Prostate abscess Pain slowly improving, leukocytosis downtrending  encouraging patient to  transition from intravenous to oral analgesics in preparation for eventual discharge. status post TURP with drainage of prostatic abscess on 11/14 by Dr. Cardell Peach Concerning organism, recent urine culture 10/25 grew out MRSA with more recent urine culture on 11/14 once again growing out  Staphylococcus aureus and Providencia rettgeri  Cultures discussed with Dr. Renold Don with infectious disease who is graciously agreed to come evaluate the patient in consultation tomorrow.   Per infectious disease recommendation, switching from ceftriaxone to cefepime and continuing vancomycin Patiently ordering echocardiogram to evaluate for any evidence of infective endocarditis per infectious disease recommendations.  No clinical evidence to suggest infective endocarditis at this time. Foley catheter remains in place postoperatively with planned voiding trial on Monday 11/18 Urology following, their input is appreciated.  They recommend at least 3 weeks of total antibiotics. Acute UTI MRSA UTI identified on urine culture 10/25 More recent urine culture on 11/14 once again growing out Staphylococcus aureus and Providencia rettgeri  Remainder of assessment and plan as above Polysubstance abuse (HCC) Urine toxicology screen on 11/13 revealing cocaine and THC use Counseling on cessation daily Acute urinary retention Foley catheter remains in place Plan voiding trial on 11/18 Suicidal ideation Evaluated by Dr. Viviano Simas, patient felt to not be at imminent risk to self or others at present Patient placed on Cymbalta 30 mg twice daily and Abilify for associated depression Major depressive disorder Patient placed on Cymbalta 30 mg twice daily and Abilify for associated depression     Subjective:  Patient continues to complain of lower abdominal pain however states that this is continuing to slowly improve daily.  Pain is moderate in intensity, sharp in quality and worse with movement.  Physical Exam:  Vitals:   05/28/23 0416 05/28/23 1304 05/28/23 2110 05/29/23 0554  BP: 101/71 107/68 107/68 114/71  Pulse: 77 68 73 69  Resp: 20 18 20 20   Temp: 97.9 F (36.6 C) 98.1 F (36.7 C) 98.6 F (37 C) 98.7 F (  37.1 C)  TempSrc:  Oral  Oral  SpO2: 91% 92% 93% 92%  Weight:      Height:         Constitutional: Awake alert and oriented x3, patient is in distress due to abdominal pain. Skin: no rashes, no lesions, good skin turgor noted. Eyes: Pupils are equally reactive to light.  No evidence of scleral icterus or conjunctival pallor.  ENMT: Moist mucous membranes noted.  Posterior pharynx clear of any exudate or lesions.   Respiratory: clear to auscultation bilaterally, no wheezing, no crackles. Normal respiratory effort. No accessory muscle use.  Cardiovascular: Regular rate and rhythm, no murmurs / rubs / gallops. No extremity edema. 2+ pedal pulses. No carotid bruits.  Abdomen: Lower abdominal tenderness.  Abdomen is soft.  No evidence of intra-abdominal masses.  Positive bowel sounds noted in all quadrants.   Musculoskeletal: No joint deformity upper and lower extremities. Good ROM, no contractures. Normal muscle tone.  GU: Foley catheter in place draining slightly bloody urine   Data Reviewed:  I have personally reviewed and interpreted labs, imaging.  Significant findings are   CBC: Recent Labs  Lab 05/26/23 1434 05/27/23 0409 05/27/23 0929 05/28/23 0612 05/29/23 0531  WBC 8.6 10.8* 13.4* 14.5* 9.3  NEUTROABS 5.1 9.7* 11.6* 9.8* 4.6  HGB 13.7 13.1 12.7* 12.2* 12.7*  HCT 41.6 40.4 40.2 38.7* 38.6*  MCV 85.1 86.0 89.1 89.2 87.1  PLT 544* 508* 482* 477* 452*   Basic Metabolic Panel: Recent Labs  Lab 05/25/23 0832 05/27/23 0409 05/28/23 0612 05/29/23 0531  NA 137 134* 131* 134*  K 3.5 4.0 3.8 3.9  CL 107 100 101 102  CO2 21* 24 23 24   GLUCOSE 100* 132* 103* 109*  BUN 14 10 13 10   CREATININE 0.85 0.66 0.60* 0.60*  CALCIUM 9.1 8.9 8.3* 8.5*  MG  --  2.1 2.1 1.9  PHOS  --  3.2 3.0 3.3   GFR: Estimated Creatinine Clearance: 95.2 mL/min (A) (by C-G formula based on SCr of 0.6 mg/dL (L)). Liver Function Tests: Recent Labs  Lab 05/25/23 0832 05/27/23 0409 05/28/23 0612 05/29/23 0531  AST 45* 44* 35 55*  ALT 47* 43 38 51*  ALKPHOS 72 65 58 70   BILITOT 0.8 0.5 0.4 0.4  PROT 7.8 7.9 6.9 6.9  ALBUMIN 3.5 3.3* 3.1* 3.0*    Code Status:  Full code.  Code status decision has been confirmed with: patient Family Communication: None    Severity of Illness:  The appropriate patient status for this patient is INPATIENT. Inpatient status is judged to be reasonable and necessary in order to provide the required intensity of service to ensure the patient's safety. The patient's presenting symptoms, physical exam findings, and initial radiographic and laboratory data in the context of their chronic comorbidities is felt to place them at high risk for further clinical deterioration. Furthermore, it is not anticipated that the patient will be medically stable for discharge from the hospital within 2 midnights of admission.   * I certify that at the point of admission it is my clinical judgment that the patient will require inpatient hospital care spanning beyond 2 midnights from the point of admission due to high intensity of service, high risk for further deterioration and high frequency of surveillance required.*  Time spent:  45 minutes  Author:  Marinda Elk MD  05/29/2023 8:14 AM

## 2023-05-29 NOTE — Plan of Care (Signed)
  Problem: Safety: Goal: Ability to remain free from injury will improve Outcome: Progressing   Problem: Skin Integrity: Goal: Risk for impaired skin integrity will decrease Outcome: Progressing   Problem: Pain Management: Goal: General experience of comfort will improve Outcome: Progressing   Problem: Coping: Goal: Level of anxiety will decrease Outcome: Progressing   Problem: Clinical Measurements: Goal: Ability to maintain clinical measurements within normal limits will improve Outcome: Progressing   Problem: Education: Goal: Knowledge of General Education information will improve Description: Including pain rating scale, medication(s)/side effects and non-pharmacologic comfort measures Outcome: Progressing

## 2023-05-29 NOTE — Assessment & Plan Note (Signed)
MRSA UTI identified on urine culture 10/25 More recent urine culture on 11/14 once again growing out Staphylococcus aureus and Providencia rettgeri with sensitivities pending Remainder of assessment and plan as above
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
## 2023-05-29 NOTE — Progress Notes (Signed)
Urology Inpatient Progress Note  Subjective: Patient doing well.  No fever.  Leukocytosis resolved. Only complaint is cath discomfort.  Urine culture growing mrsa- started vanc yesterday Blood cultures neg  Anti-infectives: Anti-infectives (From admission, onward)    Start     Dose/Rate Route Frequency Ordered Stop   05/28/23 0400  vancomycin (VANCOCIN) IVPB 1000 mg/200 mL premix        1,000 mg 200 mL/hr over 60 Minutes Intravenous Every 12 hours 05/27/23 1610     05/27/23 1600  cefTRIAXone (ROCEPHIN) 2 g in sodium chloride 0.9 % 100 mL IVPB        2 g 200 mL/hr over 30 Minutes Intravenous Daily 05/27/23 1458     05/27/23 1400  vancomycin (VANCOREADY) IVPB 1500 mg/300 mL        1,500 mg 150 mL/hr over 120 Minutes Intravenous  Once 05/27/23 1310 05/27/23 1814   05/26/23 1747  sodium chloride 0.9 % with cefTRIAXone (ROCEPHIN) ADS Med       Note to Pharmacy: Minor, Anneita S: cabinet override      05/26/23 1747 05/27/23 0559   05/26/23 1200  clindamycin (CLEOCIN) IVPB 600 mg  Status:  Discontinued        600 mg 100 mL/hr over 30 Minutes Intravenous Every 8 hours 05/26/23 0505 05/27/23 1307   05/26/23 0130  clindamycin (CLEOCIN) IVPB 600 mg        600 mg 100 mL/hr over 30 Minutes Intravenous  Once 05/26/23 0116 05/26/23 0351   05/26/23 0130  cefTRIAXone (ROCEPHIN) 2 g in sodium chloride 0.9 % 100 mL IVPB        2 g 200 mL/hr over 30 Minutes Intravenous  Once 05/26/23 0126 05/26/23 0319   05/26/23 0115  cefTRIAXone (ROCEPHIN) 2 g in sodium chloride 0.9 % 100 mL IVPB  Status:  Discontinued        2 g 200 mL/hr over 30 Minutes Intravenous  Once 05/26/23 0108 05/26/23 0110   05/25/23 1600  clindamycin (CLEOCIN) capsule 300 mg  Status:  Discontinued        300 mg Oral 3 times daily 05/25/23 1558 05/26/23 0118   05/25/23 1600  fluconazole (DIFLUCAN) tablet 150 mg        150 mg Oral  Once 05/25/23 1558 05/25/23 1612       Current Facility-Administered Medications  Medication Dose  Route Frequency Provider Last Rate Last Admin   acetaminophen (TYLENOL) tablet 650 mg  650 mg Oral Q6H PRN Dezii, Alexandra, DO       Or   acetaminophen (TYLENOL) suppository 650 mg  650 mg Rectal Q6H PRN Dezii, Alexandra, DO       ARIPiprazole (ABILIFY) tablet 5 mg  5 mg Oral Daily Mariel Craft, MD   5 mg at 05/29/23 0916   baclofen (LIORESAL) tablet 10 mg  10 mg Oral TID Debarah Crape, DO   10 mg at 05/29/23 0916   bisacodyl (DULCOLAX) EC tablet 5 mg  5 mg Oral Daily PRN Dezii, Alexandra, DO       cefTRIAXone (ROCEPHIN) 2 g in sodium chloride 0.9 % 100 mL IVPB  2 g Intravenous Daily Shalhoub, Deno Lunger, MD 200 mL/hr at 05/29/23 0920 2 g at 05/29/23 0920   Chlorhexidine Gluconate Cloth 2 % PADS 6 each  6 each Topical Daily Dezii, Alexandra, DO   6 each at 05/29/23 0931   diclofenac Sodium (VOLTAREN) 1 % topical gel 2 g  2 g Topical QID Marinda Elk,  MD   2 g at 05/29/23 1610   diphenhydrAMINE (BENADRYL) capsule 25 mg  25 mg Oral Q6H PRN Dezii, Alexandra, DO   25 mg at 05/28/23 0244   DULoxetine (CYMBALTA) DR capsule 30 mg  30 mg Oral BID Dezii, Alexandra, DO   30 mg at 05/29/23 0916   feeding supplement (ENSURE ENLIVE / ENSURE PLUS) liquid 237 mL  237 mL Oral BID BM Shalhoub, Deno Lunger, MD   237 mL at 05/29/23 0913   HYDROmorphone (DILAUDID) injection 0.5 mg  0.5 mg Intravenous Q4H PRN Marinda Elk, MD       metoCLOPramide (REGLAN) injection 10 mg  10 mg Intravenous Q8H PRN Dezii, Alexandra, DO       ondansetron (ZOFRAN) tablet 4 mg  4 mg Oral Q6H PRN Dezii, Alexandra, DO       Or   ondansetron (ZOFRAN) injection 4 mg  4 mg Intravenous Q6H PRN Dezii, Alexandra, DO   4 mg at 05/26/23 2319   Oral care mouth rinse  15 mL Mouth Rinse PRN Shalhoub, Deno Lunger, MD       oxyCODONE (Oxy IR/ROXICODONE) immediate release tablet 10 mg  10 mg Oral Q4H PRN Marinda Elk, MD   10 mg at 05/29/23 9604   polyethylene glycol (MIRALAX / GLYCOLAX) packet 17 g  17 g Oral Daily PRN Dezii,  Alexandra, DO       vancomycin (VANCOCIN) IVPB 1000 mg/200 mL premix  1,000 mg Intravenous Q12H Pricilla Riffle, RPH   Paused at 05/29/23 0509     Objective: Vital signs in last 24 hours: Temp:  [98.1 F (36.7 C)-98.7 F (37.1 C)] 98.7 F (37.1 C) (11/17 0554) Pulse Rate:  [68-73] 69 (11/17 0554) Resp:  [18-20] 20 (11/17 0554) BP: (107-114)/(68-71) 114/71 (11/17 0554) SpO2:  [92 %-93 %] 92 % (11/17 0554)  Intake/Output from previous day: 11/16 0701 - 11/17 0700 In: 1339.9 [P.O.:1040; IV Piggyback:299.9] Out: 800 [Urine:800] Intake/Output this shift: Total I/O In: 240 [P.O.:240] Out: 300 [Urine:300]  GENERAL APPEARANCE:  Well appearing, well developed, well nourished, NAD HEENT:  Atraumatic, normocephalic, oropharynx clear NECK:  Supple without lymphadenopathy or thyromegaly ABDOMEN:  Soft, non-tender, no masses EXTREMITIES:  Moves all extremities well, without clubbing, cyanosis, or edema NEUROLOGIC:  Alert and oriented x 3, normal gait, CN II-XII grossly intact MENTAL STATUS:  appropriate BACK:  Non-tender to palpation, No CVAT SKIN:  Warm, dry, and intact   Lab Results:  Recent Labs    05/28/23 0612 05/29/23 0531  WBC 14.5* 9.3  HGB 12.2* 12.7*  HCT 38.7* 38.6*  PLT 477* 452*   BMET Recent Labs    05/28/23 0612 05/29/23 0531  NA 131* 134*  K 3.8 3.9  CL 101 102  CO2 23 24  GLUCOSE 103* 109*  BUN 13 10  CREATININE 0.60* 0.60*  CALCIUM 8.3* 8.5*   PT/INR No results for input(s): "LABPROT", "INR" in the last 72 hours. ABG No results for input(s): "PHART", "HCO3" in the last 72 hours.  Invalid input(s): "PCO2", "PO2"  Studies/Results: No results found.   Assessment & Plan: Prostatic abscess s/p TUR Culture growing mrsa (vanc started) and providencia (covered by rocephin) Plan for TOV tomorrow  Joline Maxcy, MD 05/29/2023

## 2023-05-29 NOTE — Assessment & Plan Note (Signed)
Pain slowly improving, leukocytosis downtrending  encouraging patient to  transition from intravenous to oral analgesics in preparation for eventual discharge. status post TURP with drainage of prostatic abscess on 11/14 by Dr. Cardell Peach Concerning organism, recent urine culture 10/25 grew out MRSA with more recent urine culture on 11/14 once again growing out Staphylococcus aureus and Providencia rettgeri  Cultures discussed with Dr. Renold Don with infectious disease who is graciously agreed to come evaluate the patient in consultation tomorrow.   Per infectious disease recommendation, switching from ceftriaxone to cefepime and continuing vancomycin Patiently ordering echocardiogram to evaluate for any evidence of infective endocarditis per infectious disease recommendations.  No clinical evidence to suggest infective endocarditis at this time. Foley catheter remains in place postoperatively with planned voiding trial on Monday 11/18 Urology following, their input is appreciated.  They recommend at least 3 weeks of total antibiotics.

## 2023-05-29 NOTE — Assessment & Plan Note (Signed)
Foley catheter remains in place Plan voiding trial on 11/18

## 2023-05-30 ENCOUNTER — Inpatient Hospital Stay (HOSPITAL_COMMUNITY): Payer: Medicare HMO

## 2023-05-30 DIAGNOSIS — I38 Endocarditis, valve unspecified: Secondary | ICD-10-CM | POA: Diagnosis not present

## 2023-05-30 DIAGNOSIS — N390 Urinary tract infection, site not specified: Secondary | ICD-10-CM | POA: Diagnosis not present

## 2023-05-30 DIAGNOSIS — B9562 Methicillin resistant Staphylococcus aureus infection as the cause of diseases classified elsewhere: Secondary | ICD-10-CM | POA: Diagnosis not present

## 2023-05-30 DIAGNOSIS — F329 Major depressive disorder, single episode, unspecified: Secondary | ICD-10-CM

## 2023-05-30 LAB — CBC WITH DIFFERENTIAL/PLATELET
Abs Immature Granulocytes: 0.05 10*3/uL (ref 0.00–0.07)
Basophils Absolute: 0.1 10*3/uL (ref 0.0–0.1)
Basophils Relative: 1 %
Eosinophils Absolute: 0.3 10*3/uL (ref 0.0–0.5)
Eosinophils Relative: 4 %
HCT: 40 % (ref 39.0–52.0)
Hemoglobin: 13.4 g/dL (ref 13.0–17.0)
Immature Granulocytes: 1 %
Lymphocytes Relative: 37 %
Lymphs Abs: 2.9 10*3/uL (ref 0.7–4.0)
MCH: 28.7 pg (ref 26.0–34.0)
MCHC: 33.5 g/dL (ref 30.0–36.0)
MCV: 85.7 fL (ref 80.0–100.0)
Monocytes Absolute: 1 10*3/uL (ref 0.1–1.0)
Monocytes Relative: 13 %
Neutro Abs: 3.5 10*3/uL (ref 1.7–7.7)
Neutrophils Relative %: 44 %
Platelets: 496 10*3/uL — ABNORMAL HIGH (ref 150–400)
RBC: 4.67 MIL/uL (ref 4.22–5.81)
RDW: 14.9 % (ref 11.5–15.5)
WBC: 7.8 10*3/uL (ref 4.0–10.5)
nRBC: 0 % (ref 0.0–0.2)

## 2023-05-30 LAB — COMPREHENSIVE METABOLIC PANEL
ALT: 78 U/L — ABNORMAL HIGH (ref 0–44)
AST: 89 U/L — ABNORMAL HIGH (ref 15–41)
Albumin: 3.2 g/dL — ABNORMAL LOW (ref 3.5–5.0)
Alkaline Phosphatase: 83 U/L (ref 38–126)
Anion gap: 7 (ref 5–15)
BUN: 12 mg/dL (ref 6–20)
CO2: 26 mmol/L (ref 22–32)
Calcium: 8.5 mg/dL — ABNORMAL LOW (ref 8.9–10.3)
Chloride: 100 mmol/L (ref 98–111)
Creatinine, Ser: 0.62 mg/dL (ref 0.61–1.24)
GFR, Estimated: >60 mL/min (ref >60)
Glucose, Bld: 97 mg/dL (ref 70–99)
Potassium: 4.1 mmol/L (ref 3.5–5.1)
Sodium: 133 mmol/L — ABNORMAL LOW (ref 135–145)
Total Bilirubin: 0.4 mg/dL (ref <1.2)
Total Protein: 7.3 g/dL (ref 6.5–8.1)

## 2023-05-30 LAB — ECHOCARDIOGRAM COMPLETE
AR max vel: 2.58 cm2
AV Area VTI: 2.63 cm2
AV Area mean vel: 2.45 cm2
AV Mean grad: 4 mm[Hg]
AV Peak grad: 7.3 mm[Hg]
Ao pk vel: 1.35 m/s
Area-P 1/2: 2.96 cm2
Height: 67 in
S' Lateral: 2.4 cm
Weight: 2620.83 [oz_av]

## 2023-05-30 LAB — MAGNESIUM: Magnesium: 2.2 mg/dL (ref 1.7–2.4)

## 2023-05-30 MED ORDER — SULFAMETHOXAZOLE-TRIMETHOPRIM 800-160 MG PO TABS
1.0000 | ORAL_TABLET | Freq: Two times a day (BID) | ORAL | Status: DC
  Administered 2023-05-30 – 2023-05-31 (×3): 1 via ORAL
  Filled 2023-05-30 (×3): qty 1

## 2023-05-30 MED ORDER — ENOXAPARIN SODIUM 40 MG/0.4ML IJ SOSY
40.0000 mg | PREFILLED_SYRINGE | INTRAMUSCULAR | Status: DC
  Administered 2023-05-30: 40 mg via SUBCUTANEOUS
  Filled 2023-05-30: qty 0.4

## 2023-05-30 MED ORDER — SODIUM CHLORIDE 0.9 % IV SOLN: 2.0000 g | INTRAVENOUS | Status: DC

## 2023-05-30 NOTE — Progress Notes (Addendum)
   4 Days Post-Op Subjective: Pain has improved over the weekend. He is amenable to proceeding with voiding trial.   Objective: Vital signs in last 24 hours: Temp:  [98.2 F (36.8 C)-98.9 F (37.2 C)] 98.2 F (36.8 C) (11/18 0531) Pulse Rate:  [71-83] 83 (11/18 0531) Resp:  [16] 16 (11/18 0531) BP: (104-117)/(69-83) 104/69 (11/18 0531) SpO2:  [92 %-95 %] 92 % (11/18 0531)  Assessment/Plan: # Prostate abscess CT A/P on 05/25/2023 notes multiple hypoattenuating foci in the prostate gland-up to 2.5 cm. To the OR for TURP with Dr. Cardell Peach and Dr. Youlanda Mighty later 05/26/2023. Providencia rettgeri and MRSA (+) on UCx.  NGTD on BC.  Will need 3 weeks outpatient ABX. Foley catheter in place draining clear yellow urine.  TOV today.   Intake/Output from previous day: 11/17 0701 - 11/18 0700 In: 1059.8 [P.O.:600; IV Piggyback:459.8] Out: 3075 [Urine:3075]  Intake/Output this shift: No intake/output data recorded.  Physical Exam:  General: Alert and oriented CV: No cyanosis Lungs: equal chest rise Gu: Foley catheter in place draining clear yellow urine.  Lab Results: Recent Labs    05/28/23 0612 05/29/23 0531 05/30/23 0504  HGB 12.2* 12.7* 13.4  HCT 38.7* 38.6* 40.0   BMET Recent Labs    05/29/23 0531 05/30/23 0504  NA 134* 133*  K 3.9 4.1  CL 102 100  CO2 24 26  GLUCOSE 109* 97  BUN 10 12  CREATININE 0.60* 0.62  CALCIUM 8.5* 8.5*     Studies/Results: No results found.    LOS: 4 days   [name], NP [facility] Pager: [phone]  05/30/2023, 10:53 AM   I have seen and examined the patient and agree with the above assessment and plan.  Void trial ongoing. Continue 3 weeks PO abx outpatient.  Matt R. Danetra Glock MD [facility]  Pager: [phone]

## 2023-05-30 NOTE — Progress Notes (Signed)
PROGRESS NOTE    Mark Porter  OAC:[id number] DOB: [birth year] DOA: 05/25/2023 PCP: Devra Dopp, MD   Brief Narrative:  This 57 year old male with PMH significant of chronic pain syndrome, urinary retention presented to the ED with penile and suprapubic pain as well as left flank pain for approximately 1 week. Patient also reported suicidal ideation on arrival. He was found to be febrile in the ED, Urinalysis was suggestive of urinary tract infection , CT A/P concerning for prostatic abscesses measuring up to 1.4 x 2.5 cm. Patient was admitted for further evaluation. Patient was initiated IV Rocephin and clindamycin. Dr. Cardell Peach with urology was consulted and Patient underwent TURP with unroofing of the prostatic abscess.    Considering recent urine culture growing out MRSA on 10/25, Patient was transitioned to intravenous vancomycin on 11/15.   Concerning the patient's suicidal ideation, patient was evaluated by psychiatry and it was felt the patient was not at risk of harming himself.  Patient was initiated on Cymbalta and recommended close outpatient follow-up.   Assessment & Plan:   Principal Problem:   Acute UTI Active Problems:   Polysubstance abuse (HCC)   Suicidal ideation   Prostate abscess   Cocaine abuse (HCC)   Acute urinary retention   Major depressive disorder   Prostatic abscess: Patient presented with penile and suprapubic pain, found to have prostatic abscess. Urology consulted,  status post TURP with drainage of prostatic abscess on 11/14 by Dr. Cardell Peach Concerning organism, recent urine culture 10/25 grew out MRSA with more recent urine culture on 11/14 once again growing out Staphylococcus aureus and Providencia rettgeri  Cultures discussed with Dr. Renold Don with infectious disease who agreed to evaluate the patient. Antibiotics switched from ceftriaxone to cefepime and continuing vancomycin as per ID. No clinical evidence to suggest infective endocarditis at this time.   Pending TTE to rule out endocarditis. Foley catheter remains in place postoperatively .  Plan for trial of voiding today. Urology following, their input is appreciated.  They recommend at least 3 weeks of total antibiotics. Pain slowly improving, leukocytosis downtrending   Urinary tract infection: MRSA UTI identified on urine culture 10/25. More recent urine culture on 11/14 once again growing out Staphylococcus aureus and Providencia rettgeri  Continue vancomycin and cefepime, follow-up ID recommendation.  Polysubstance abuse: Urine toxicology screen on 11/13 revealing cocaine and THC use Counseling on cessation daily.  Acute urinary retention: Foley catheter remains in place. Trial of voiding today.  Suicidal ideation: Evaluated by Dr. Viviano Simas, patient felt to not be at imminent risk to self or others at present Patient placed on Cymbalta 30 mg twice daily and Abilify for associated depression.  Major depressive disorder Patient placed on Cymbalta 30 mg twice daily and Abilify for associated depression   DVT prophylaxis: Lovenox Code Status: Full code Family Communication: No family at bedside. Disposition Plan: Inpatient Admitted for prostatic abscess,  underwent TURP with drainage of the abscess.   Cultures growing MRSA on IV antibiotics. ID is consulted   Consultants:  Urology  ID  Procedures: TURP  Antimicrobials:  Anti-infectives (From admission, onward)    Start     Dose/Rate Route Frequency Ordered Stop   05/30/23 1800  cefTRIAXone (ROCEPHIN) 2 g in sodium chloride 0.9 % 100 mL IVPB        2 g 200 mL/hr over 30 Minutes Intravenous Every 24 hours 05/30/23 1136     05/29/23 2000  ceFEPIme (MAXIPIME) 2 g in sodium chloride 0.9 % 100 mL IVPB  Status:  Discontinued        2 g 200 mL/hr over 30 Minutes Intravenous Every 8 hours 05/29/23 1708 05/30/23 1136   05/28/23 0400  vancomycin (VANCOCIN) IVPB 1000 mg/200 mL premix        1,000 mg 200 mL/hr over 60 Minutes  Intravenous Every 12 hours 05/27/23 1610     05/27/23 1600  cefTRIAXone (ROCEPHIN) 2 g in sodium chloride 0.9 % 100 mL IVPB  Status:  Discontinued        2 g 200 mL/hr over 30 Minutes Intravenous Daily 05/27/23 1458 05/29/23 1652   05/27/23 1400  vancomycin (VANCOREADY) IVPB 1500 mg/300 mL        1,500 mg 150 mL/hr over 120 Minutes Intravenous  Once 05/27/23 1310 05/27/23 1814   05/26/23 1747  sodium chloride 0.9 % with cefTRIAXone (ROCEPHIN) ADS Med       Note to Pharmacy: Minor, Anneita S: cabinet override      05/26/23 1747 05/27/23 0559   05/26/23 1200  clindamycin (CLEOCIN) IVPB 600 mg  Status:  Discontinued        600 mg 100 mL/hr over 30 Minutes Intravenous Every 8 hours 05/26/23 0505 05/27/23 1307   05/26/23 0130  clindamycin (CLEOCIN) IVPB 600 mg        600 mg 100 mL/hr over 30 Minutes Intravenous  Once 05/26/23 0116 05/26/23 0351   05/26/23 0130  cefTRIAXone (ROCEPHIN) 2 g in sodium chloride 0.9 % 100 mL IVPB        2 g 200 mL/hr over 30 Minutes Intravenous  Once 05/26/23 0126 05/26/23 0319   05/26/23 0115  cefTRIAXone (ROCEPHIN) 2 g in sodium chloride 0.9 % 100 mL IVPB  Status:  Discontinued        2 g 200 mL/hr over 30 Minutes Intravenous  Once 05/26/23 0108 05/26/23 0110   05/25/23 1600  clindamycin (CLEOCIN) capsule 300 mg  Status:  Discontinued        300 mg Oral 3 times daily 05/25/23 1558 05/26/23 0118   05/25/23 1600  fluconazole (DIFLUCAN) tablet 150 mg        150 mg Oral  Once 05/25/23 1558 05/25/23 1612      Subjective: Patient was seen and examined at bedside.Overnight events noted.   Patient appears very deconditioned, denies any suicidal ideations or thoughts.   Patient reports doing better, still reports having suprapubic discomfort.  Objective: Vitals:   05/29/23 0554 05/29/23 1248 05/29/23 2000 05/30/23 0531  BP: 114/71 117/83 114/69 104/69  Pulse: 69 73 71 83  Resp: 20 16  16   Temp: 98.7 F (37.1 C) 98.2 F (36.8 C) 98.9 F (37.2 C) 98.2 F (36.8  C)  TempSrc: Oral Oral Oral Oral  SpO2: 92% 94% 95% 92%  Weight:      Height:        Intake/Output Summary (Last 24 hours) at 05/30/2023 1137 Last data filed at 05/30/2023 1045 Gross per 24 hour  Intake 819.79 ml  Output 3475 ml  Net -2655.21 ml   Filed Weights   05/25/23 0653 05/27/23 0757  Weight: 72.6 kg 74.3 kg    Examination:  General exam: Appears calm and comfortable , deconditioned, not in any acute distress. Respiratory system: Clear to auscultation. Respiratory effort normal.  RR 16 Cardiovascular system: S1 & S2 heard, RRR. No JVD, murmurs, rubs, gallops or clicks. No pedal edema. Gastrointestinal system: Abdomen is non distended, soft and non tender.  Abdominal hernia noted , normal bowel sounds heard.  Central nervous system: Alert and oriented x 3. No focal neurological deficits. Extremities: No edema, no cyanosis, no clubbing. Skin: No rashes, lesions or ulcers Psychiatry: Judgement and insight appear normal. Mood & affect appropriate.     Data Reviewed: I have personally reviewed following labs and imaging studies  CBC: Recent Labs  Lab 05/27/23 0409 05/27/23 0929 05/28/23 0612 05/29/23 0531 05/30/23 0504  WBC 10.8* 13.4* 14.5* 9.3 7.8  NEUTROABS 9.7* 11.6* 9.8* 4.6 3.5  HGB 13.1 12.7* 12.2* 12.7* 13.4  HCT 40.4 40.2 38.7* 38.6* 40.0  MCV 86.0 89.1 89.2 87.1 85.7  PLT 508* 482* 477* 452* 496*   Basic Metabolic Panel: Recent Labs  Lab 05/25/23 0832 05/27/23 0409 05/28/23 0612 05/29/23 0531 05/30/23 0504  NA 137 134* 131* 134* 133*  K 3.5 4.0 3.8 3.9 4.1  CL 107 100 101 102 100  CO2 21* 24 23 24 26   GLUCOSE 100* 132* 103* 109* 97  BUN 14 10 13 10 12   CREATININE 0.85 0.66 0.60* 0.60* 0.62  CALCIUM 9.1 8.9 8.3* 8.5* 8.5*  MG  --  2.1 2.1 1.9 2.2  PHOS  --  3.2 3.0 3.3  --    GFR: Estimated Creatinine Clearance: 95.2 mL/min (by C-G formula based on SCr of 0.62 mg/dL). Liver Function Tests: Recent Labs  Lab 05/25/23 0832  05/27/23 0409 05/28/23 0612 05/29/23 0531 05/30/23 0504  AST 45* 44* 35 55* 89*  ALT 47* 43 38 51* 78*  ALKPHOS 72 65 58 70 83  BILITOT 0.8 0.5 0.4 0.4 0.4  PROT 7.8 7.9 6.9 6.9 7.3  ALBUMIN 3.5 3.3* 3.1* 3.0* 3.2*   No results for input(s): "LIPASE", "AMYLASE" in the last 168 hours. No results for input(s): "AMMONIA" in the last 168 hours. Coagulation Profile: No results for input(s): "INR", "PROTIME" in the last 168 hours. Cardiac Enzymes: No results for input(s): "CKTOTAL", "CKMB", "CKMBINDEX", "TROPONINI" in the last 168 hours. BNP (last 3 results) No results for input(s): "PROBNP" in the last 8760 hours. HbA1C: No results for input(s): "HGBA1C" in the last 72 hours. CBG: No results for input(s): "GLUCAP" in the last 168 hours. Lipid Profile: No results for input(s): "CHOL", "HDL", "LDLCALC", "TRIG", "CHOLHDL", "LDLDIRECT" in the last 72 hours. Thyroid Function Tests: No results for input(s): "TSH", "T4TOTAL", "FREET4", "T3FREE", "THYROIDAB" in the last 72 hours. Anemia Panel: No results for input(s): "VITAMINB12", "FOLATE", "FERRITIN", "TIBC", "IRON", "RETICCTPCT" in the last 72 hours. Sepsis Labs: No results for input(s): "PROCALCITON", "LATICACIDVEN" in the last 168 hours.  Recent Results (from the past 240 hour(s))  Urine Culture     Status: Abnormal   Collection Time: 05/26/23  1:09 AM   Specimen: Urine, Catheterized  Result Value Ref Range Status   Specimen Description URINE, CATHETERIZED  Final   Special Requests   Final    NONE Performed at [facility], [address], [place], [place] [address]    Culture (A)  Final    >=100,000 COLONIES/mL PROVIDENCIA RETTGERI >=100,000 COLONIES/mL METHICILLIN RESISTANT STAPHYLOCOCCUS AUREUS    Report Status 05/29/2023 FINAL  Final   Organism ID, Bacteria PROVIDENCIA RETTGERI (A)  Final   Organism ID, Bacteria METHICILLIN RESISTANT STAPHYLOCOCCUS AUREUS (A)  Final      Susceptibility   Methicillin resistant  staphylococcus aureus - MIC*    CIPROFLOXACIN >=8 RESISTANT Resistant     GENTAMICIN <=0.5 SENSITIVE Sensitive     NITROFURANTOIN <=16 SENSITIVE Sensitive     OXACILLIN >=4 RESISTANT Resistant  TETRACYCLINE <=1 SENSITIVE Sensitive     VANCOMYCIN <=0.5 SENSITIVE Sensitive     TRIMETH/SULFA <=10 SENSITIVE Sensitive     CLINDAMYCIN <=0.25 SENSITIVE Sensitive     RIFAMPIN <=0.5 SENSITIVE Sensitive     Inducible Clindamycin NEGATIVE Sensitive     LINEZOLID 2 SENSITIVE Sensitive     * >=100,000 COLONIES/mL METHICILLIN RESISTANT STAPHYLOCOCCUS AUREUS   Providencia rettgeri - MIC*    AMPICILLIN RESISTANT Resistant     CEFEPIME <=0.12 SENSITIVE Sensitive     CEFTRIAXONE <=0.25 SENSITIVE Sensitive     CIPROFLOXACIN <=0.25 SENSITIVE Sensitive     GENTAMICIN <=1 SENSITIVE Sensitive     IMIPENEM 1 SENSITIVE Sensitive     NITROFURANTOIN 128 RESISTANT Resistant     TRIMETH/SULFA <=20 SENSITIVE Sensitive     AMPICILLIN/SULBACTAM <=2 SENSITIVE Sensitive     PIP/TAZO <=4 SENSITIVE Sensitive ug/mL    * >=100,000 COLONIES/mL PROVIDENCIA RETTGERI  Blood culture (routine x 2)     Status: None (Preliminary result)   Collection Time: 05/26/23  5:37 AM   Specimen: BLOOD  Result Value Ref Range Status   Specimen Description BLOOD BLOOD RIGHT HAND  Final   Special Requests   Final    BOTTLES DRAWN AEROBIC AND ANAEROBIC Blood Culture results may not be optimal due to an excessive volume of blood received in culture bottles   Culture   Final    NO GROWTH 4 DAYS Performed at [facility], [address], [place], [place] [address]    Report Status PENDING  Incomplete  Blood culture (routine x 2)     Status: None (Preliminary result)   Collection Time: 05/26/23  5:37 AM   Specimen: BLOOD  Result Value Ref Range Status   Specimen Description BLOOD BLOOD LEFT HAND  Final   Special Requests   Final    BOTTLES DRAWN AEROBIC AND ANAEROBIC Blood Culture results may not be optimal due to an excessive  volume of blood received in culture bottles   Culture   Final    NO GROWTH 4 DAYS Performed at [facility], [address] [place], [place] [address]    Report Status PENDING  Incomplete  Urine Culture     Status: Abnormal   Collection Time: 05/26/23  4:45 PM   Specimen: Urine, Catheterized  Result Value Ref Range Status   Specimen Description   Final    URINE, CATHETERIZED Performed at [facility], [address], [place], [place] [address]    Special Requests   Final    NONE Performed at [facility], [address], [place], [place] [address]    Culture MULTIPLE SPECIES PRESENT, SUGGEST RECOLLECTION (A)  Final   Report Status 05/27/2023 FINAL  Final    Radiology Studies: No results found.  Scheduled Meds:  ARIPiprazole  5 mg Oral Daily   baclofen  10 mg Oral TID   Chlorhexidine Gluconate Cloth  6 each Topical Daily   diclofenac Sodium  2 g Topical QID   DULoxetine  30 mg Oral BID   enoxaparin (LOVENOX) injection  40 mg Subcutaneous Q24H   feeding supplement  237 mL Oral BID BM   Continuous Infusions:  cefTRIAXone (ROCEPHIN)  IV     vancomycin 1,000 mg (05/30/23 0715)     LOS: 4 days    Time spent: 50 Mins    Willeen Niece, MD [facility]   If 7PM-7AM, please contact night-coverage
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
## 2023-05-30 NOTE — Assessment & Plan Note (Signed)
Patient placed on Cymbalta 30 mg twice daily and Abilify for associated depression

## 2023-05-31 ENCOUNTER — Other Ambulatory Visit (HOSPITAL_COMMUNITY): Payer: Medicare HMO

## 2023-05-31 ENCOUNTER — Other Ambulatory Visit (HOSPITAL_COMMUNITY): Payer: Self-pay

## 2023-05-31 DIAGNOSIS — N390 Urinary tract infection, site not specified: Secondary | ICD-10-CM | POA: Diagnosis not present

## 2023-05-31 LAB — BASIC METABOLIC PANEL
Anion gap: 8 (ref 5–15)
BUN: 15 mg/dL (ref 6–20)
CO2: 24 mmol/L (ref 22–32)
Calcium: 8.7 mg/dL — ABNORMAL LOW (ref 8.9–10.3)
Chloride: 100 mmol/L (ref 98–111)
Creatinine, Ser: 0.69 mg/dL (ref 0.61–1.24)
GFR, Estimated: >60 mL/min (ref >60)
Glucose, Bld: 120 mg/dL — ABNORMAL HIGH (ref 70–99)
Potassium: 4.2 mmol/L (ref 3.5–5.1)
Sodium: 132 mmol/L — ABNORMAL LOW (ref 135–145)

## 2023-05-31 LAB — CULTURE, BLOOD (ROUTINE X 2)
Culture: NO GROWTH
Culture: NO GROWTH

## 2023-05-31 MED ORDER — DULOXETINE HCL 30 MG PO CPEP
30.0000 mg | ORAL_CAPSULE | Freq: Two times a day (BID) | ORAL | 0 refills | Status: DC
  Filled 2023-05-31: qty 60, 30d supply, fill #0

## 2023-05-31 MED ORDER — OXYCODONE HCL 10 MG PO TABS
5.0000 mg | ORAL_TABLET | ORAL | 0 refills | Status: AC | PRN
  Filled 2023-05-31: qty 9, 3d supply, fill #0

## 2023-05-31 MED ORDER — OXYCODONE HCL 10 MG PO TABS
5.0000 mg | ORAL_TABLET | ORAL | 0 refills | Status: DC | PRN
  Filled 2023-05-31: qty 9, 3d supply, fill #0

## 2023-05-31 MED ORDER — SULFAMETHOXAZOLE-TRIMETHOPRIM 800-160 MG PO TABS
1.0000 | ORAL_TABLET | Freq: Two times a day (BID) | ORAL | 0 refills | Status: DC
  Filled 2023-05-31: qty 56, 28d supply, fill #0

## 2023-05-31 MED ORDER — DICLOFENAC SODIUM 1 % EX GEL
2.0000 g | Freq: Four times a day (QID) | CUTANEOUS | 0 refills | Status: DC
  Filled 2023-05-31: qty 100, 10d supply, fill #0

## 2023-05-31 MED ORDER — DICLOFENAC SODIUM 1 % EX GEL
2.0000 g | Freq: Four times a day (QID) | CUTANEOUS | 0 refills | Status: DC
  Filled 2023-05-31: qty 100, 7d supply, fill #0

## 2023-05-31 MED ORDER — ARIPIPRAZOLE 5 MG PO TABS
5.0000 mg | ORAL_TABLET | Freq: Every day | ORAL | 0 refills | Status: DC
  Filled 2023-05-31: qty 30, 30d supply, fill #0

## 2023-05-31 NOTE — Progress Notes (Signed)
5 Days Post-Op Subjective: Successful voiding trial yesterday.  Some of his prostatic pain has diminished now that the catheter and retention balloon are no longer putting pressure on his surgical site.  No acute events overnight.  Objective: Vital signs in last 24 hours: Temp:  [98.2 F (36.8 C)-98.7 F (37.1 C)] 98.7 F (37.1 C) (11/19 0350) Pulse Rate:  [82-91] 87 (11/19 0350) Resp:  [17-18] 18 (11/19 0350) BP: (102-122)/(65-80) 115/77 (11/19 0350) SpO2:  [92 %-94 %] 92 % (11/19 0350)  Assessment/Plan: # Prostate abscess CT A/P on 05/25/2023 notes multiple hypoattenuating foci in the prostate gland-up to 2.5 cm. To the OR for TURP with Dr. Cardell Peach and Dr. Youlanda Mighty later 05/26/2023. Providencia rettgeri and MRSA (+) on UCx.  NGTD on BC.  Will need 3 weeks outpatient ABX.  Follow-up after completion of treatment.  Will have schedulers contact him. Successful voiding trial.  Pain is improved and patient is stable from a urologic perspective.  May discharge whenever medically cleared.  Please call with questions.   Intake/Output from previous day: 11/18 0701 - 11/19 0700 In: 420 [P.O.:420] Out: 2225 [Urine:2225]  Intake/Output this shift: Total I/O In: -  Out: 200 [Urine:200]  Physical Exam:  General: Alert and oriented CV: No cyanosis Lungs: equal chest rise Gu: Foley removed  Lab Results: Recent Labs    05/29/23 0531 05/30/23 0504  HGB 12.7* 13.4  HCT 38.6* 40.0   BMET Recent Labs    05/29/23 0531 05/30/23 0504  NA 134* 133*  K 3.9 4.1  CL 102 100  CO2 24 26  GLUCOSE 109* 97  BUN 10 12  CREATININE 0.60* 0.62  CALCIUM 8.5* 8.5*     Studies/Results: ECHOCARDIOGRAM COMPLETE  Result Date: 05/30/2023    ECHOCARDIOGRAM REPORT   Patient Name:   Mark Porter Date of Exam: 05/30/2023 Medical Rec #:  [id number]       Height:       67.0 in Accession #:    [id number]      Weight:       163.8 lb Date of Birth:  03/22/1966      BSA:          1.858 m Patient Age:     57 years        BP:           104/69 mmHg Patient Gender: M               HR:           75 bpm. Exam Location:  Inpatient Procedure: 2D Echo, Cardiac Doppler and Color Doppler Indications:    Endocarditis  History:        Patient has prior history of Echocardiogram examinations, most                 recent 05/25/2020.  Sonographer:    Darlys Gales Referring Phys: [id number] Deno Lunger [place] IMPRESSIONS  1. Left ventricular ejection fraction, by estimation, is 60 to 65%. The left ventricle has normal function. The left ventricle has no regional wall motion abnormalities. There is mild left ventricular hypertrophy. Left ventricular diastolic parameters were normal.  2. Right ventricular systolic function is normal. The right ventricular size is normal. There is normal pulmonary artery systolic pressure. The estimated right ventricular systolic pressure is 20.5 mmHg.  3. The mitral valve is normal in structure. No evidence of mitral valve regurgitation. No evidence of mitral stenosis.  4. The aortic valve is tricuspid. Aortic  valve regurgitation is not visualized. No aortic stenosis is present.  5. The inferior vena cava is normal in size with greater than 50% respiratory variability, suggesting right atrial pressure of 3 mmHg. Comparison(s): A prior study was performed on 05/25/2020. No significant change from prior study. Conclusion(s)/Recommendation(s): No evidence of valvular vegetations on this transthoracic echocardiogram. Consider a transesophageal echocardiogram to exclude infective endocarditis if clinically indicated. FINDINGS  Left Ventricle: Left ventricular ejection fraction, by estimation, is 60 to 65%. The left ventricle has normal function. The left ventricle has no regional wall motion abnormalities. The left ventricular internal cavity size was normal in size. There is  mild left ventricular hypertrophy. Left ventricular diastolic parameters were normal. Right Ventricle: The right ventricular size is  normal. No increase in right ventricular wall thickness. Right ventricular systolic function is normal. There is normal pulmonary artery systolic pressure. The tricuspid regurgitant velocity is 2.09 m/s, and  with an assumed right atrial pressure of 3 mmHg, the estimated right ventricular systolic pressure is 20.5 mmHg. Left Atrium: Left atrial size was normal in size. Right Atrium: Right atrial size was normal in size. Pericardium: There is no evidence of pericardial effusion. Mitral Valve: The mitral valve is normal in structure. No evidence of mitral valve regurgitation. No evidence of mitral valve stenosis. There is no evidence of mitral valve vegetation. Tricuspid Valve: The tricuspid valve is normal in structure. Tricuspid valve regurgitation is mild . No evidence of tricuspid stenosis. There is no evidence of tricuspid valve vegetation. Aortic Valve: The aortic valve is tricuspid. Aortic valve regurgitation is not visualized. No aortic stenosis is present. Aortic valve mean gradient measures 4.0 mmHg. Aortic valve peak gradient measures 7.3 mmHg. Aortic valve area, by VTI measures 2.63 cm. There is no evidence of aortic valve vegetation. Pulmonic Valve: The pulmonic valve was normal in structure. Pulmonic valve regurgitation is not visualized. No evidence of pulmonic stenosis. There is no evidence of pulmonic valve vegetation. Aorta: The aortic root and ascending aorta are structurally normal, with no evidence of dilitation. Venous: The inferior vena cava is normal in size with greater than 50% respiratory variability, suggesting right atrial pressure of 3 mmHg. IAS/Shunts: The atrial septum is grossly normal.  LEFT VENTRICLE PLAX 2D LVIDd:         4.20 cm   Diastology LVIDs:         2.40 cm   LV e' medial:    7.07 cm/s LV PW:         1.10 cm   LV E/e' medial:  10.5 LV IVS:        1.20 cm   LV e' lateral:   8.38 cm/s LVOT diam:     2.00 cm   LV E/e' lateral: 8.8 LV SV:         59 LV SV Index:   32 LVOT Area:      3.14 cm  RIGHT VENTRICLE RV S prime:     15.60 cm/s TAPSE (M-mode): 2.0 cm LEFT ATRIUM             Index       RIGHT ATRIUM          Index LA Vol (A2C):   13.4 ml 7.21 ml/m  RA Area:     7.30 cm LA Vol (A4C):   16.4 ml 8.83 ml/m  RA Volume:   9.77 ml  5.26 ml/m LA Biplane Vol: 15.1 ml 8.13 ml/m  AORTIC VALVE AV Area (Vmax):  2.58 cm AV Area (Vmean):   2.45 cm AV Area (VTI):     2.63 cm AV Vmax:           135.00 cm/s AV Vmean:          96.100 cm/s AV VTI:            0.223 m AV Peak Grad:      7.3 mmHg AV Mean Grad:      4.0 mmHg LVOT Vmax:         111.00 cm/s LVOT Vmean:        74.800 cm/s LVOT VTI:          0.187 m LVOT/AV VTI ratio: 0.84  AORTA Ao Root diam: 3.40 cm Ao Asc diam:  3.50 cm MITRAL VALVE               TRICUSPID VALVE MV Area (PHT): 2.96 cm    TR Peak grad:   17.5 mmHg MV Decel Time: 256 msec    TR Vmax:        209.00 cm/s MV E velocity: 74.10 cm/s MV A velocity: 58.30 cm/s  SHUNTS MV E/A ratio:  1.27        Systemic VTI:  0.19 m                            Systemic Diam: 2.00 cm Sunit Tolia Electronically signed by Tessa Lerner Signature Date/Time: 05/30/2023/1:30:21 PM    Final       LOS: 5 days   [name], NP [facility] Pager: [phone]  05/31/2023, 9:20 AM

## 2023-05-31 NOTE — Care Management Important Message (Signed)
Important Message  Patient Details IM Letter given Name: AUGUSTUS ALLMAN MRN: [id number] Date of Birth: 1965-10-04   Important Message Given:  Yes - Medicare IM     Caren Macadam 05/31/2023, 10:44 AM

## 2023-05-31 NOTE — Progress Notes (Signed)
AVS reviewed w/ patient who verbalized an understanding. No other questions at this time. Discharge medications delivered in a secure bag from the [facility] - placed in pt's belonging bag. Pt will do home via [facility].

## 2023-05-31 NOTE — Discharge Instructions (Signed)
Advised to follow-up with primary care physician in 1 week. Advised to follow-up with infectious diseases in 4 weeks. Advised to follow-up with urology as scheduled. Advised to take Bactrim DS twice a day for 4 weeks for prostatic abscess.

## 2023-05-31 NOTE — TOC Transition Note (Addendum)
Transition of Care [facility]) - CM/SW Discharge Note   Patient Details  Name: Mark Porter MRN: [id number] Date of Birth: 12-17-1965  [facility] CM/SW Contact:  Lanier Clam, RN Phone Number: 05/31/2023, 10:53 AM   Clinical Narrative:   spoke to patient about d/c plans-quadriplegic;home safe-confirmed address-has his own w/c in rm to go onto [facility] with patient-left vm contact for  safe transport for arrangements with nsg once meds are delivered. -11a patient will need meds to bed. Quadriplegic-patient can transfer from our w/c (let arm down) to the seat in safe transport van. Keep his w/c folded in the back of van. Nsg I will email the safe transport form once signed, & face sheet with address. U can call safe transport tel#[phone].when ready. -12:27p clarification per safe transport on w/c-patient has his own w/c that arms can come off/on-patient will wheel down in his own w/c,& transfer to the seat,driver will fold his own w/c & place in [place]. No need for hospital's w/c to be used.  Nurse to bring sticker down for verifcation to driver. Nsg updated. No further CM needs.    Final next level of care: Home/Self Care Barriers to Discharge: No Barriers Identified   Patient Goals and CMS Choice      Discharge Placement                         Discharge Plan and Services Additional resources added to the After Visit Summary for     Discharge Planning Services: CM Consult                                 Social Determinants of Health (SDOH) Interventions SDOH Screenings   Food Insecurity: Food Insecurity Present (05/26/2023)  Housing: Patient Declined (05/26/2023)  Transportation Needs: Unmet Transportation Needs (05/26/2023)  Utilities: At Risk (05/26/2023)  Social Connections: Unknown (11/23/2021)   Received from [facility], [facility]  Tobacco Use: Low Risk  (05/26/2023)     Readmission Risk Interventions    02/24/2023   11:58  AM  Readmission Risk Prevention Plan  Post Dischage Appt Complete  Medication Screening Complete  Transportation Screening Complete

## 2023-05-31 NOTE — Discharge Summary (Signed)
Physician Discharge Summary  Mark Porter JOA:[id number] DOB: 1965/10/14 DOA: 05/25/2023  PCP: Devra Dopp, MD  Admit date: 05/25/2023  Discharge date: 05/31/2023  Admitted From: Home.  Disposition:  Home.  Recommendations for Outpatient Follow-up:  Follow up with PCP in 1-2 weeks. Please obtain BMP/CBC in one week. Advised to follow-up with Infectious diseases in 4 weeks. Advised to follow-up with Urology as scheduled. Advised to take Bactrim DS twice a day for 4 weeks for prostatic abscess.  Home Health:None Equipment/Devices:None  Discharge Condition: Stable CODE STATUS:Full code Diet recommendation: Heart Healthy   Brief [facility] Course: This 57 year old male with PMH significant of chronic pain syndrome, urinary retention presented to the ED with penile and suprapubic pain as well as left flank pain for approximately 1 week. Patient also reported suicidal ideation on arrival. He was found to be febrile in the ED, Urinalysis was suggestive of urinary tract infection , CT A/P concerning for prostatic abscesses measuring up to 1.4 x 2.5 cm. Patient was admitted for further evaluation. Patient was initiated IV Rocephin and clindamycin. Dr. Cardell Peach with urology was consulted and Patient underwent TURP with unroofing of the prostatic abscess.  Considering recent urine culture growing out MRSA on 10/25, Patient was transitioned to intravenous vancomycin on 11/15. Concerning the patient's suicidal ideation, patient was evaluated by psychiatry and it was felt the patient was not at risk of harming himself.  Patient was initiated on Cymbalta and recommended close outpatient follow-up.  Patient has made significant improvement,  abdominal pain has improved.  Cultures growing Staphylococcus aureus and Providencia rettgeri, as per infectious diseases patient can be discharged on Bactrim DS twice daily for 4 weeks for prostatic abscess.  Foley catheter was removed and  patient  successfully voided.  Leukocytosis has improved.  Urology signed off , recommended patient can be discharged and outpatient follow-up in 2 weeks.  Patient is being discharged home.  Discharge Diagnoses:  Principal Problem:   Acute UTI Active Problems:   Polysubstance abuse (HCC)   Suicidal ideation   Prostate abscess   Cocaine abuse (HCC)   Acute urinary retention   Major depressive disorder  Prostatic abscess: Patient presented with penile and suprapubic pain, found to have prostatic abscess. Urology consulted,  status post TURP with drainage of prostatic abscess on 11/14 by Dr. Cardell Peach Concerning organism, recent urine culture 10/25 grew out MRSA with more recent urine culture on 11/14 once again growing out Staphylococcus aureus and Providencia rettgeri  Cultures discussed with Dr. Renold Don with infectious disease who agreed to evaluate the patient. Antibiotics switched from ceftriaxone to cefepime and continuing vancomycin as per ID. No clinical evidence to suggest infective endocarditis at this time.  TTE ruled out Infective endocarditis. Foley catheter remains in place postoperatively , successfully voided. Foley removed. Urology following, their input is appreciated.  They recommend at least 3 weeks of total antibiotics.    Urinary tract infection: MRSA UTI identified on urine culture 10/25. More recent urine culture on 11/14 once again growing out Staphylococcus aureus and Providencia rettgeri  Continue vancomycin and cefepime, follow-up ID recommendation. Patient discharged on Bactrim DS twice a day for 4 weeks.  Polysubstance abuse: Urine toxicology screen on 11/13 revealing cocaine and THC use Counseling on cessation daily.   Acute urinary retention: Foley catheter remains in place. Foley removed, successfully voided.  Urinary retention resolved.   Suicidal ideation: Evaluated by Dr. Viviano Simas, patient felt to not be at imminent risk to self or others at present Patient placed  on Cymbalta 30 mg twice daily and Abilify for associated depression.   Major depressive disorder Patient placed on Cymbalta 30 mg twice daily and Abilify for associated depression  Discharge Instructions  Discharge Instructions     Call MD for:  difficulty breathing, headache or visual disturbances   Complete by: As directed    Call MD for:  persistant dizziness or light-headedness   Complete by: As directed    Call MD for:  persistant nausea and vomiting   Complete by: As directed    Diet - low sodium heart healthy   Complete by: As directed    Diet Carb Modified   Complete by: As directed    Discharge instructions   Complete by: As directed    Advised to follow-up with primary care physician in 1 week. Advised to follow-up with infectious diseases in 4 weeks. Advised to follow-up with urology as scheduled. Advised to take Bactrim DS twice a day for 4 weeks for prostatic abscess.   Increase activity slowly   Complete by: As directed       Allergies as of 05/31/2023       Reactions   Carisoprodol Other (See Comments)   Soma. Urinary retention        Medication List     STOP taking these medications    clindamycin 300 MG capsule Commonly known as: Cleocin   fluconazole 150 MG tablet Commonly known as: Diflucan       TAKE these medications    ARIPiprazole 5 MG tablet Commonly known as: ABILIFY Take 1 tablet (5 mg total) by mouth daily. Start taking on: June 01, 2023   baclofen 10 MG tablet Commonly known as: LIORESAL Take 10 mg by mouth 3 (three) times daily.   diclofenac Sodium 1 % Gel Commonly known as: VOLTAREN Apply 2 grams topically 4 (four) times daily.   DULoxetine 30 MG capsule Commonly known as: CYMBALTA Take 1 capsule (30 mg total) by mouth 2 (two) times daily.   Oxycodone HCl 10 MG Tabs Take 1/2 tablet (5 mg total) by mouth every 4 (four) hours as needed for up to 3 days for moderate pain (pain score 4-6).    sulfamethoxazole-trimethoprim 800-160 MG tablet Commonly known as: BACTRIM DS Take 1 tablet by mouth every 12 (twelve) hours for 28 days.        Follow-up Information     Devra Dopp, MD Follow up in 1 week(s).   Specialty: Family Medicine Contact information: [address] [place] [address] [phone]         Judyann Munson, MD Follow up in 4 week(s).   Specialty: Infectious Diseases Contact information: [address] [place] [address] [phone]         Jannifer Hick, MD Follow up in 2 week(s).   Specialty: Urology Contact information: [address] [place] [address] [phone]         Primary Care Physician. Schedule an appointment as soon as possible for a visit.   Why: Patient to call his insurance for PCP               Allergies  Allergen Reactions   Carisoprodol Other (See Comments)    Soma. Urinary retention    Consultations: Infectious Diseases, Urology   Procedures/Studies: ECHOCARDIOGRAM COMPLETE  Result Date: 05/30/2023    ECHOCARDIOGRAM REPORT   Patient Name:   JUANYE ZIOBRO Wisnieski Date of Exam: 05/30/2023 Medical Rec #:  [id number]  Height:       67.0 in Accession #:    [id number]      Weight:       163.8 lb Date of Birth:  [birth year]      BSA:          1.858 m Patient Age:    57 years        BP:           104/69 mmHg Patient Gender: M               HR:           75 bpm. Exam Location:  Inpatient Procedure: 2D Echo, Cardiac Doppler and Color Doppler Indications:    Endocarditis  History:        Patient has prior history of Echocardiogram examinations, most                 recent 05/25/2020.  Sonographer:    Darlys Gales Referring Phys: [id number] Deno Lunger [place] IMPRESSIONS  1. Left ventricular ejection fraction, by estimation, is 60 to 65%. The left ventricle has normal function. The left ventricle has no regional wall motion abnormalities. There is mild left ventricular hypertrophy. Left ventricular  diastolic parameters were normal.  2. Right ventricular systolic function is normal. The right ventricular size is normal. There is normal pulmonary artery systolic pressure. The estimated right ventricular systolic pressure is 20.5 mmHg.  3. The mitral valve is normal in structure. No evidence of mitral valve regurgitation. No evidence of mitral stenosis.  4. The aortic valve is tricuspid. Aortic valve regurgitation is not visualized. No aortic stenosis is present.  5. The inferior vena cava is normal in size with greater than 50% respiratory variability, suggesting right atrial pressure of 3 mmHg. Comparison(s): A prior study was performed on 05/25/2020. No significant change from prior study. Conclusion(s)/Recommendation(s): No evidence of valvular vegetations on this transthoracic echocardiogram. Consider a transesophageal echocardiogram to exclude infective endocarditis if clinically indicated. FINDINGS  Left Ventricle: Left ventricular ejection fraction, by estimation, is 60 to 65%. The left ventricle has normal function. The left ventricle has no regional wall motion abnormalities. The left ventricular internal cavity size was normal in size. There is  mild left ventricular hypertrophy. Left ventricular diastolic parameters were normal. Right Ventricle: The right ventricular size is normal. No increase in right ventricular wall thickness. Right ventricular systolic function is normal. There is normal pulmonary artery systolic pressure. The tricuspid regurgitant velocity is 2.09 m/s, and  with an assumed right atrial pressure of 3 mmHg, the estimated right ventricular systolic pressure is 20.5 mmHg. Left Atrium: Left atrial size was normal in size. Right Atrium: Right atrial size was normal in size. Pericardium: There is no evidence of pericardial effusion. Mitral Valve: The mitral valve is normal in structure. No evidence of mitral valve regurgitation. No evidence of mitral valve stenosis. There is no  evidence of mitral valve vegetation. Tricuspid Valve: The tricuspid valve is normal in structure. Tricuspid valve regurgitation is mild . No evidence of tricuspid stenosis. There is no evidence of tricuspid valve vegetation. Aortic Valve: The aortic valve is tricuspid. Aortic valve regurgitation is not visualized. No aortic stenosis is present. Aortic valve mean gradient measures 4.0 mmHg. Aortic valve peak gradient measures 7.3 mmHg. Aortic valve area, by VTI measures 2.63 cm. There is no evidence of aortic valve vegetation. Pulmonic Valve: The pulmonic valve was normal in structure. Pulmonic valve regurgitation is not visualized. No evidence of pulmonic stenosis. There  is no evidence of pulmonic valve vegetation. Aorta: The aortic root and ascending aorta are structurally normal, with no evidence of dilitation. Venous: The inferior vena cava is normal in size with greater than 50% respiratory variability, suggesting right atrial pressure of 3 mmHg. IAS/Shunts: The atrial septum is grossly normal.  LEFT VENTRICLE PLAX 2D LVIDd:         4.20 cm   Diastology LVIDs:         2.40 cm   LV e' medial:    7.07 cm/s LV PW:         1.10 cm   LV E/e' medial:  10.5 LV IVS:        1.20 cm   LV e' lateral:   8.38 cm/s LVOT diam:     2.00 cm   LV E/e' lateral: 8.8 LV SV:         59 LV SV Index:   32 LVOT Area:     3.14 cm  RIGHT VENTRICLE RV S prime:     15.60 cm/s TAPSE (M-mode): 2.0 cm LEFT ATRIUM             Index       RIGHT ATRIUM          Index LA Vol (A2C):   13.4 ml 7.21 ml/m  RA Area:     7.30 cm LA Vol (A4C):   16.4 ml 8.83 ml/m  RA Volume:   9.77 ml  5.26 ml/m LA Biplane Vol: 15.1 ml 8.13 ml/m  AORTIC VALVE AV Area (Vmax):    2.58 cm AV Area (Vmean):   2.45 cm AV Area (VTI):     2.63 cm AV Vmax:           135.00 cm/s AV Vmean:          96.100 cm/s AV VTI:            0.223 m AV Peak Grad:      7.3 mmHg AV Mean Grad:      4.0 mmHg LVOT Vmax:         111.00 cm/s LVOT Vmean:        74.800 cm/s LVOT VTI:           0.187 m LVOT/AV VTI ratio: 0.84  AORTA Ao Root diam: 3.40 cm Ao Asc diam:  3.50 cm MITRAL VALVE               TRICUSPID VALVE MV Area (PHT): 2.96 cm    TR Peak grad:   17.5 mmHg MV Decel Time: 256 msec    TR Vmax:        209.00 cm/s MV E velocity: 74.10 cm/s MV A velocity: 58.30 cm/s  SHUNTS MV E/A ratio:  1.27        Systemic VTI:  0.19 m                            Systemic Diam: 2.00 cm Sunit Tolia Electronically signed by Tessa Lerner Signature Date/Time: 05/30/2023/1:30:21 PM    Final    CT ABDOMEN PELVIS W CONTRAST  Result Date: 05/25/2023 CLINICAL DATA:  Abdominal pain, acute, nonlocalized mult prior surg, now w diffuse pain, anorexia. EXAM: CT ABDOMEN AND PELVIS WITH CONTRAST TECHNIQUE: Multidetector CT imaging of the abdomen and pelvis was performed using the standard protocol following bolus administration of intravenous contrast. RADIATION DOSE REDUCTION: This exam was performed according to the departmental dose-optimization program  which includes automated exposure control, adjustment of the mA and/or kV according to patient size and/or use of iterative reconstruction technique. CONTRAST:  75mL ISOVUE-370 IOPAMIDOL (ISOVUE-370) INJECTION 76% COMPARISON:  CT scan abdomen and pelvis from 05/06/2023. FINDINGS: Lower chest: There are subpleural atelectatic changes in the visualized lung bases. No overt consolidation. No pleural effusion. The heart is normal in size. No pericardial effusion. Hepatobiliary: The liver is normal in size. Non-cirrhotic configuration. No suspicious mass. These is mild diffuse hepatic steatosis. No intrahepatic or extrahepatic bile duct dilation. No calcified gallstones. Normal gallbladder wall thickness. No pericholecystic inflammatory changes. Pancreas: Unremarkable. No pancreatic ductal dilatation or surrounding inflammatory changes. 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 Adrenals/Urinary Tract: Adrenal glands are  unremarkable. No suspicious renal mass. No hydronephrosis. No renal or ureteric calculi. Urinary bladder is decompressed secondary to Foley catheter. Stomach/Bowel: There is mild circumferential thickening of the lower thoracic esophagus, which is most likely seen in the settings of chronic gastroesophageal reflux disease versus esophagitis. No disproportionate dilation of the small or large bowel loops. No evidence of abnormal bowel wall thickening or inflammatory changes. The appendix is unremarkable. Vascular/Lymphatic: No ascites or pneumoperitoneum. No abdominal or pelvic lymphadenopathy, by size criteria. No aneurysmal dilation of the major abdominal arteries. There are mild peripheral atherosclerotic vascular calcifications of the aorta and its major branches. Reproductive: Mildly enlarged prostate gland. There are several hypoattenuating foci in the prostate gland with largest measuring up to 1.4 x 2.5 cm which exhibits incomplete peripheral hyperattenuating wall. Differential diagnosis includes prostatic abscess versus prostatic cysts. Correlate clinically and with urinalysis. Other: There are several midline supraumbilical fat containing ventral hernias. No herniation of bowel loop. There is also a small fat containing left inguinal hernia. The soft tissues and abdominal wall are otherwise unremarkable. Musculoskeletal: No suspicious osseous lesions. There are mild - moderate multilevel degenerative changes in the visualized spine. 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 There is mild anterior wedging deformity of T12 vertebral body which also exhibits superior endplate Schmorl's node. No significant interval change. IMPRESSION: *There are several hypoattenuating foci in the prostate, as described above, which may represent prostatic abscess versus prostatic cysts. Correlate clinically and with urinalysis. *Multiple other nonacute observations, as described above. Electronically Signed   By: Jules Schick M.D.   On: 05/25/2023 15:27   DG Chest 2 View  Result Date: 05/25/2023 CLINICAL DATA:  L axilla pain EXAM: CHEST - 2 VIEW COMPARISON:  CXR 04/15/23 FINDINGS: No pleural effusion. No pneumothorax. Unchanged cardiac and mediastinal contours. Interval improvement in previously noted hazy opacity in the retrocardiac region. No radiographically apparent displaced rib fractures. Visualized upper abdomen is unremarkable. Vertebral body heights are maintained. IMPRESSION: Interval improvement in previously noted hazy opacity in the retrocardiac region. No radiographically apparent displaced rib fractures. Electronically Signed   By: Lorenza Cambridge M.D.   On: 05/25/2023 09:09   US SCROTUM W/DOPPLER  Result Date: 05/10/2023 CLINICAL DATA:  scrotal pain EXAM: SCROTAL ULTRASOUND DOPPLER ULTRASOUND OF THE TESTICLES TECHNIQUE: Complete ultrasound examination of the testicles, epididymis, and other scrotal structures was performed. Color and spectral Doppler ultrasound were also utilized to evaluate blood flow to the testicles. COMPARISON:  Scrotal ultrasound dated Dec 03, 2018. FINDINGS: Right testicle Measurements: 4.9 x 2.1 x 3.1 cm. No mass or microlithiasis visualized. Left testicle Measurements: 5.1 x 2.4 x 3.1 cm. No mass or microlithiasis visualized. Right epididymis:  Normal in size and appearance. Left epididymis:  Normal in size and  appearance. Hydrocele:  Small right-sided hydrocele. Varicocele:  None visualized. Pulsed Doppler interrogation of both testes demonstrates normal low resistance arterial and venous waveforms bilaterally. Other: There is a 5 mm right-sided extratesticular calcification within the scrotum, which may represent a scrotolith. IMPRESSION: 1. No acute sonographic abnormality. No evidence of testicular torsion. 2. A 5 mm extratesticular calcification in the scrotum is similar to the prior exam and may represent a scrotolith. Electronically Signed   By: Hart Robinsons M.D.   On:  05/10/2023 12:26   CT ABDOMEN PELVIS W CONTRAST  Result Date: 05/06/2023 CLINICAL DATA:  Abdominal pain, groin pain EXAM: CT ABDOMEN AND PELVIS WITH CONTRAST TECHNIQUE: Multidetector CT imaging of the abdomen and pelvis was performed using the standard protocol following bolus administration of intravenous contrast. RADIATION DOSE REDUCTION: This exam was performed according to the departmental dose-optimization program which includes automated exposure control, adjustment of the mA and/or kV according to patient size and/or use of iterative reconstruction technique. CONTRAST:  75mL OMNIPAQUE IOHEXOL 350 MG/ML SOLN COMPARISON:  1824 FINDINGS: Lower chest: No acute abnormality Hepatobiliary: No focal hepatic abnormality. Gallbladder unremarkable. Pancreas: No focal abnormality or ductal dilatation. 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 Adrenals/Urinary Tract: No adrenal abnormality. No focal renal abnormality. No stones or hydronephrosis. Foley catheter in the bladder which is decompressed, grossly unremarkable. Stomach/Bowel: Stomach, large and small bowel grossly unremarkable. Vascular/Lymphatic: Aortic atherosclerosis. No evidence of aneurysm or adenopathy. Reproductive: No visible focal abnormality. Other: No free fluid or free air. Small left inguinal hernia containing fat. At least 2 supraumbilical ventral hernias noted containing fat, stable. Musculoskeletal: No acute bony abnormality. IMPRESSION: No acute findings in the abdomen or pelvis. Supraumbilical ventral hernias and left inguinal hernia containing fat. Electronically Signed   By: Charlett Nose M.D.   On: 05/06/2023 19:22     Subjective: Patient was seen and examined at bedside.  Overnight events noted.   Patient reports doing much better.  He still has lower abdominal discomfort but improving.  He wants to be discharged.  Discharge Exam: Vitals:   05/31/23 0350 05/31/23 1348  BP: 115/77  108/74  Pulse: 87 81  Resp: 18 20  Temp: 98.7 F (37.1 C) 98.4 F (36.9 C)  SpO2: 92% 95%   Vitals:   05/30/23 1318 05/30/23 1932 05/31/23 0350 05/31/23 1348  BP: 102/65 122/80 115/77 108/74  Pulse: 82 91 87 81  Resp: 18 17 18 20   Temp: 98.7 F (37.1 C) 98.2 F (36.8 C) 98.7 F (37.1 C) 98.4 F (36.9 C)  TempSrc: Oral Oral Oral Oral  SpO2: 93% 94% 92% 95%  Weight:      Height:        General: Pt is alert, awake, not in acute distress Cardiovascular: RRR, S1/S2 +, no rubs, no gallops Respiratory: CTA bilaterally, no wheezing, no rhonchi Abdominal: Soft, NT, ND, bowel sounds + Extremities: no edema, no cyanosis    The results of significant diagnostics from this hospitalization (including imaging, microbiology, ancillary and laboratory) are listed below for reference.     Microbiology: Recent Results (from the past 240 hour(s))  Urine Culture     Status: Abnormal   Collection Time: 05/26/23  1:09 AM   Specimen: Urine, Catheterized  Result Value Ref Range Status   Specimen Description URINE, CATHETERIZED  Final   Special Requests   Final    NONE Performed at [facility], [address], [place], [place] [address]  Culture (A)  Final    >=100,000 COLONIES/mL PROVIDENCIA RETTGERI >=100,000 COLONIES/mL METHICILLIN RESISTANT STAPHYLOCOCCUS AUREUS    Report Status 05/29/2023 FINAL  Final   Organism ID, Bacteria PROVIDENCIA RETTGERI (A)  Final   Organism ID, Bacteria METHICILLIN RESISTANT STAPHYLOCOCCUS AUREUS (A)  Final      Susceptibility   Methicillin resistant staphylococcus aureus - MIC*    CIPROFLOXACIN >=8 RESISTANT Resistant     GENTAMICIN <=0.5 SENSITIVE Sensitive     NITROFURANTOIN <=16 SENSITIVE Sensitive     OXACILLIN >=4 RESISTANT Resistant     TETRACYCLINE <=1 SENSITIVE Sensitive     VANCOMYCIN <=0.5 SENSITIVE Sensitive     TRIMETH/SULFA <=10 SENSITIVE Sensitive     CLINDAMYCIN <=0.25 SENSITIVE Sensitive     RIFAMPIN <=0.5 SENSITIVE  Sensitive     Inducible Clindamycin NEGATIVE Sensitive     LINEZOLID 2 SENSITIVE Sensitive     * >=100,000 COLONIES/mL METHICILLIN RESISTANT STAPHYLOCOCCUS AUREUS   Providencia rettgeri - MIC*    AMPICILLIN RESISTANT Resistant     CEFEPIME <=0.12 SENSITIVE Sensitive     CEFTRIAXONE <=0.25 SENSITIVE Sensitive     CIPROFLOXACIN <=0.25 SENSITIVE Sensitive     GENTAMICIN <=1 SENSITIVE Sensitive     IMIPENEM 1 SENSITIVE Sensitive     NITROFURANTOIN 128 RESISTANT Resistant     TRIMETH/SULFA <=20 SENSITIVE Sensitive     AMPICILLIN/SULBACTAM <=2 SENSITIVE Sensitive     PIP/TAZO <=4 SENSITIVE Sensitive ug/mL    * >=100,000 COLONIES/mL PROVIDENCIA RETTGERI  Blood culture (routine x 2)     Status: None   Collection Time: 05/26/23  5:37 AM   Specimen: BLOOD  Result Value Ref Range Status   Specimen Description BLOOD BLOOD RIGHT HAND  Final   Special Requests   Final    BOTTLES DRAWN AEROBIC AND ANAEROBIC Blood Culture results may not be optimal due to an excessive volume of blood received in culture bottles   Culture   Final    NO GROWTH 5 DAYS Performed at [facility], [address] [place], [place] [address]    Report Status 05/31/2023 FINAL  Final  Blood culture (routine x 2)     Status: None   Collection Time: 05/26/23  5:37 AM   Specimen: BLOOD  Result Value Ref Range Status   Specimen Description BLOOD BLOOD LEFT HAND  Final   Special Requests   Final    BOTTLES DRAWN AEROBIC AND ANAEROBIC Blood Culture results may not be optimal due to an excessive volume of blood received in culture bottles   Culture   Final    NO GROWTH 5 DAYS Performed at [facility], [address], [place], [place] [address]    Report Status 05/31/2023 FINAL  Final  Urine Culture     Status: Abnormal   Collection Time: 05/26/23  4:45 PM   Specimen: Urine, Catheterized  Result Value Ref Range Status   Specimen Description   Final    URINE, CATHETERIZED Performed at [facility] [place] [facility], [address] [place], [place] [address]    Special Requests   Final    NONE Performed at [facility], [address], [place], [place] [address]    Culture MULTIPLE SPECIES PRESENT, SUGGEST RECOLLECTION (A)  Final   Report Status 05/27/2023 FINAL  Final     Labs: BNP (last 3 results) No results for input(s): "BNP" in the last 8760 hours. Basic Metabolic Panel: Recent Labs  Lab 05/27/23 0409 05/28/23 0612 05/29/23  7829 05/30/23 0504 05/31/23 0924  NA 134* 131* 134* 133* 132*  K 4.0 3.8 3.9 4.1 4.2  CL 100 101 102 100 100  CO2 24 23 24 26 24   GLUCOSE 132* 103* 109* 97 120*  BUN 10 13 10 12 15   CREATININE 0.66 0.60* 0.60* 0.62 0.69  CALCIUM 8.9 8.3* 8.5* 8.5* 8.7*  MG 2.1 2.1 1.9 2.2  --   PHOS 3.2 3.0 3.3  --   --    Liver Function Tests: Recent Labs  Lab 05/25/23 0832 05/27/23 0409 05/28/23 0612 05/29/23 0531 05/30/23 0504  AST 45* 44* 35 55* 89*  ALT 47* 43 38 51* 78*  ALKPHOS 72 65 58 70 83  BILITOT 0.8 0.5 0.4 0.4 0.4  PROT 7.8 7.9 6.9 6.9 7.3  ALBUMIN 3.5 3.3* 3.1* 3.0* 3.2*   No results for input(s): "LIPASE", "AMYLASE" in the last 168 hours. No results for input(s): "AMMONIA" in the last 168 hours. CBC: Recent Labs  Lab 05/27/23 0409 05/27/23 0929 05/28/23 0612 05/29/23 0531 05/30/23 0504  WBC 10.8* 13.4* 14.5* 9.3 7.8  NEUTROABS 9.7* 11.6* 9.8* 4.6 3.5  HGB 13.1 12.7* 12.2* 12.7* 13.4  HCT 40.4 40.2 38.7* 38.6* 40.0  MCV 86.0 89.1 89.2 87.1 85.7  PLT 508* 482* 477* 452* 496*   Cardiac Enzymes: No results for input(s): "CKTOTAL", "CKMB", "CKMBINDEX", "TROPONINI" in the last 168 hours. BNP: Invalid input(s): "POCBNP" CBG: No results for input(s): "GLUCAP" in the last 168 hours. D-Dimer No results for input(s): "DDIMER" in the last 72 hours. Hgb A1c No results for input(s): "HGBA1C" in the last 72 hours. Lipid Profile No results for input(s): "CHOL", "HDL", "LDLCALC", "TRIG", "CHOLHDL", "LDLDIRECT" in the  last 72 hours. Thyroid function studies No results for input(s): "TSH", "T4TOTAL", "T3FREE", "THYROIDAB" in the last 72 hours.  Invalid input(s): "FREET3" Anemia work up No results for input(s): "VITAMINB12", "FOLATE", "FERRITIN", "TIBC", "IRON", "RETICCTPCT" in the last 72 hours. Urinalysis    Component Value Date/Time   COLORURINE AMBER (A) 05/26/2023 0029   APPEARANCEUR TURBID (A) 05/26/2023 0029   LABSPEC >1.046 (H) 05/26/2023 0029   PHURINE 6.0 05/26/2023 0029   GLUCOSEU NEGATIVE 05/26/2023 0029   HGBUR LARGE (A) 05/26/2023 0029   BILIRUBINUR NEGATIVE 05/26/2023 0029   KETONESUR 5 (A) 05/26/2023 0029   PROTEINUR >=300 (A) 05/26/2023 0029   UROBILINOGEN 1.0 01/12/2015 1356   NITRITE POSITIVE (A) 05/26/2023 0029   LEUKOCYTESUR MODERATE (A) 05/26/2023 0029   Sepsis Labs Recent Labs  Lab 05/27/23 0929 05/28/23 0612 05/29/23 0531 05/30/23 0504  WBC 13.4* 14.5* 9.3 7.8   Microbiology Recent Results (from the past 240 hour(s))  Urine Culture     Status: Abnormal   Collection Time: 05/26/23  1:09 AM   Specimen: Urine, Catheterized  Result Value Ref Range Status   Specimen Description URINE, CATHETERIZED  Final   Special Requests   Final    NONE Performed at [facility], [address] [place], [place] [address]    Culture (A)  Final    >=100,000 COLONIES/mL PROVIDENCIA RETTGERI >=100,000 COLONIES/mL METHICILLIN RESISTANT STAPHYLOCOCCUS AUREUS    Report Status 05/29/2023 FINAL  Final   Organism ID, Bacteria PROVIDENCIA RETTGERI (A)  Final   Organism ID, Bacteria METHICILLIN RESISTANT STAPHYLOCOCCUS AUREUS (A)  Final      Susceptibility   Methicillin resistant staphylococcus aureus - MIC*    CIPROFLOXACIN >=8 RESISTANT Resistant     GENTAMICIN <=0.5 SENSITIVE Sensitive     NITROFURANTOIN <=16 SENSITIVE Sensitive  OXACILLIN >=4 RESISTANT Resistant     TETRACYCLINE <=1 SENSITIVE Sensitive     VANCOMYCIN <=0.5 SENSITIVE Sensitive     TRIMETH/SULFA <=10  SENSITIVE Sensitive     CLINDAMYCIN <=0.25 SENSITIVE Sensitive     RIFAMPIN <=0.5 SENSITIVE Sensitive     Inducible Clindamycin NEGATIVE Sensitive     LINEZOLID 2 SENSITIVE Sensitive     * >=100,000 COLONIES/mL METHICILLIN RESISTANT STAPHYLOCOCCUS AUREUS   Providencia rettgeri - MIC*    AMPICILLIN RESISTANT Resistant     CEFEPIME <=0.12 SENSITIVE Sensitive     CEFTRIAXONE <=0.25 SENSITIVE Sensitive     CIPROFLOXACIN <=0.25 SENSITIVE Sensitive     GENTAMICIN <=1 SENSITIVE Sensitive     IMIPENEM 1 SENSITIVE Sensitive     NITROFURANTOIN 128 RESISTANT Resistant     TRIMETH/SULFA <=20 SENSITIVE Sensitive     AMPICILLIN/SULBACTAM <=2 SENSITIVE Sensitive     PIP/TAZO <=4 SENSITIVE Sensitive ug/mL    * >=100,000 COLONIES/mL PROVIDENCIA RETTGERI  Blood culture (routine x 2)     Status: None   Collection Time: 05/26/23  5:37 AM   Specimen: BLOOD  Result Value Ref Range Status   Specimen Description BLOOD BLOOD RIGHT HAND  Final   Special Requests   Final    BOTTLES DRAWN AEROBIC AND ANAEROBIC Blood Culture results may not be optimal due to an excessive volume of blood received in culture bottles   Culture   Final    NO GROWTH 5 DAYS Performed at [facility], [address] [place], [place] [address]    Report Status 05/31/2023 FINAL  Final  Blood culture (routine x 2)     Status: None   Collection Time: 05/26/23  5:37 AM   Specimen: BLOOD  Result Value Ref Range Status   Specimen Description BLOOD BLOOD LEFT HAND  Final   Special Requests   Final    BOTTLES DRAWN AEROBIC AND ANAEROBIC Blood Culture results may not be optimal due to an excessive volume of blood received in culture bottles   Culture   Final    NO GROWTH 5 DAYS Performed at [facility], [address] [place], [place] [address]    Report Status 05/31/2023 FINAL  Final  Urine Culture     Status: Abnormal   Collection Time: 05/26/23  4:45 PM   Specimen: Urine, Catheterized  Result Value Ref Range Status    Specimen Description   Final    URINE, CATHETERIZED Performed at [facility], [address], [place], [place] [address]    Special Requests   Final    NONE Performed at [facility], [address], [place], [place] [address]    Culture MULTIPLE SPECIES PRESENT, SUGGEST RECOLLECTION (A)  Final   Report Status 05/27/2023 FINAL  Final     Time coordinating discharge: Over 30 minutes  SIGNED:   Willeen Niece, MD  [facility] 05/31/2023, 3:50 PM Pager   If 7PM-7AM, please contact night-coverage [email or web address] Password [email or web address]

## 2023-06-21 ENCOUNTER — Emergency Department (HOSPITAL_COMMUNITY)
Admission: EM | Admit: 2023-06-21 | Discharge: 2023-06-22 | Disposition: A | Discharge status: Home / Self Care | Payer: Medicare HMO | Attending: Emergency Medicine | Admitting: Emergency Medicine

## 2023-06-21 ENCOUNTER — Other Ambulatory Visit: Payer: Self-pay

## 2023-06-21 ENCOUNTER — Encounter (HOSPITAL_COMMUNITY): Payer: Self-pay | Admitting: *Deleted

## 2023-06-21 ENCOUNTER — Ambulatory Visit (HOSPITAL_COMMUNITY)
Admission: EM | Admit: 2023-06-21 | Discharge: 2023-06-21 | Disposition: A | Discharge status: Other Acute Inpatient Hospital | Payer: Medicare HMO | Source: Home / Self Care

## 2023-06-21 DIAGNOSIS — Z91128 Patient's intentional underdosing of medication regimen for other reason: Secondary | ICD-10-CM | POA: Insufficient documentation

## 2023-06-21 DIAGNOSIS — G8254 Quadriplegia, C5-C7 incomplete: Secondary | ICD-10-CM | POA: Insufficient documentation

## 2023-06-21 DIAGNOSIS — R440 Auditory hallucinations: Secondary | ICD-10-CM | POA: Diagnosis not present

## 2023-06-21 DIAGNOSIS — T50916A Underdosing of multiple unspecified drugs, medicaments and biological substances, initial encounter: Secondary | ICD-10-CM | POA: Insufficient documentation

## 2023-06-21 DIAGNOSIS — Z5900 Homelessness unspecified: Secondary | ICD-10-CM | POA: Insufficient documentation

## 2023-06-21 DIAGNOSIS — F17210 Nicotine dependence, cigarettes, uncomplicated: Secondary | ICD-10-CM | POA: Insufficient documentation

## 2023-06-21 DIAGNOSIS — R7989 Other specified abnormal findings of blood chemistry: Secondary | ICD-10-CM | POA: Insufficient documentation

## 2023-06-21 DIAGNOSIS — R443 Hallucinations, unspecified: Secondary | ICD-10-CM

## 2023-06-21 DIAGNOSIS — Z91148 Patient's other noncompliance with medication regimen for other reason: Secondary | ICD-10-CM

## 2023-06-21 DIAGNOSIS — F329 Major depressive disorder, single episode, unspecified: Secondary | ICD-10-CM

## 2023-06-21 DIAGNOSIS — R45851 Suicidal ideations: Secondary | ICD-10-CM | POA: Insufficient documentation

## 2023-06-21 DIAGNOSIS — R441 Visual hallucinations: Secondary | ICD-10-CM

## 2023-06-21 DIAGNOSIS — R442 Other hallucinations: Secondary | ICD-10-CM | POA: Diagnosis present

## 2023-06-21 LAB — ACETAMINOPHEN LEVEL: Acetaminophen (Tylenol), Serum: <10 ug/mL — ABNORMAL LOW (ref 10–30)

## 2023-06-21 LAB — CBC
HCT: 40.5 % (ref 39.0–52.0)
Hemoglobin: 13.6 g/dL (ref 13.0–17.0)
MCH: 28.8 pg (ref 26.0–34.0)
MCHC: 33.6 g/dL (ref 30.0–36.0)
MCV: 85.8 fL (ref 80.0–100.0)
Platelets: 502 10*3/uL — ABNORMAL HIGH (ref 150–400)
RBC: 4.72 MIL/uL (ref 4.22–5.81)
RDW: 14 % (ref 11.5–15.5)
WBC: 9.1 10*3/uL (ref 4.0–10.5)
nRBC: 0 % (ref 0.0–0.2)

## 2023-06-21 LAB — COMPREHENSIVE METABOLIC PANEL
ALT: 70 U/L — ABNORMAL HIGH (ref 0–44)
AST: 70 U/L — ABNORMAL HIGH (ref 15–41)
Albumin: 3.4 g/dL — ABNORMAL LOW (ref 3.5–5.0)
Alkaline Phosphatase: 119 U/L (ref 38–126)
Anion gap: 10 (ref 5–15)
BUN: 18 mg/dL (ref 6–20)
CO2: 23 mmol/L (ref 22–32)
Calcium: 8.9 mg/dL (ref 8.9–10.3)
Chloride: 106 mmol/L (ref 98–111)
Creatinine, Ser: 0.77 mg/dL (ref 0.61–1.24)
GFR, Estimated: >60 mL/min (ref >60)
Glucose, Bld: 112 mg/dL — ABNORMAL HIGH (ref 70–99)
Potassium: 3.3 mmol/L — ABNORMAL LOW (ref 3.5–5.1)
Sodium: 139 mmol/L (ref 135–145)
Total Bilirubin: 0.4 mg/dL (ref <1.2)
Total Protein: 7.7 g/dL (ref 6.5–8.1)

## 2023-06-21 LAB — ETHANOL: Alcohol, Ethyl (B): <10 mg/dL (ref <10)

## 2023-06-21 LAB — SALICYLATE LEVEL: Salicylate Lvl: <7 mg/dL — ABNORMAL LOW (ref 7.0–30.0)

## 2023-06-21 MED ORDER — MAGNESIUM HYDROXIDE 400 MG/5ML PO SUSP: 30.0000 mL | Freq: Every day | ORAL | Status: DC | PRN

## 2023-06-21 MED ORDER — ACETAMINOPHEN 325 MG PO TABS: 650.0000 mg | ORAL_TABLET | Freq: Four times a day (QID) | ORAL | Status: DC | PRN

## 2023-06-21 MED ORDER — OLANZAPINE 5 MG PO TBDP: 5.0000 mg | ORAL_TABLET | Freq: Three times a day (TID) | ORAL | Status: DC | PRN

## 2023-06-21 MED ORDER — ALUM & MAG HYDROXIDE-SIMETH 200-200-20 MG/5ML PO SUSP: 30.0000 mL | ORAL | Status: DC | PRN

## 2023-06-21 MED ORDER — ZIPRASIDONE MESYLATE 20 MG IM SOLR: 20.0000 mg | INTRAMUSCULAR | Status: DC | PRN

## 2023-06-21 NOTE — ED Triage Notes (Signed)
Pt with hx of mental illness is here from [facility] due to visual and tactile hallucinations.  Pt is not currently taking any prescribed medications due to forgetting per pt.  In addition to his mental health issues pt is experiencing homelessness. Pt reports that he sees dark figures and things crawling on the ground and he feels like there is something crawling on his legs.  Pt is alert and oriented, he denies any SI or HI.  Pt changed into paper scrubs, notified him that urine sample is needed.
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
## 2023-06-21 NOTE — ED Notes (Signed)
Pt was dressed out and belongings were collected. Pt has a pair of jeans and 2 pair of shorts, a blue hooded sweatshirt and 2 shirts. Pt has a pair of blue shoes and 2 pairs of socks. Belongings were labeled and placed in triage nurse station 13-15.
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
## 2023-06-21 NOTE — ED Notes (Signed)
Pt was given meal tray and 2 cans of coke.  No distress at this time, urinal at the bedside.  Belongings bagged and at nurses station (one book bag, one pt belonging bag and one paper bag)

## 2023-06-21 NOTE — Progress Notes (Signed)
Report called to [facility] Charge RN on pt being transferred.

## 2023-06-21 NOTE — BH Assessment (Addendum)
Comprehensive Clinical Assessment (CCA) Note  06/21/2023 Mark Porter [id number]  Disposition: Sindy Guadeloupe, NP, recommends inpatient treatment.   The patient demonstrates the following risk factors for suicide: Chronic risk factors for suicide include: psychiatric disorder of depression, previous suicide attempts patient states yes, unable to recall events, previous self-harm yes, and medical illness yes . Acute risk factors for suicide include: family or marital conflict and social withdrawal/isolation. Protective factors for this patient include: coping skills and hope for the future. Considering these factors, the overall suicide risk at this point appears to be high. Patient is not appropriate for outpatient follow up.  Mark Porter is a 57 year old male presenting as a voluntary walk-in to [facility] due to [facility] with plan to overdose on medications that he has in his bag. Patient denies HI and alcohol/drug usage. Patient reports his brother brought him in to get help. Patient reports onset of SI years, unable to give timeframe, states "I just can't remember". Patient reports being kicked out of his girlfriends house because of "crazy behaviors". Patient reports having no where to go and that he is currently homeless. Patient reports auditory hallucinations of voices telling him awful things. Patient reports visual hallucinations of seeing shadows come at him. Patient reports seeing "things crawling on the floor, legs and skin, I scratch until I bleed, I have to dig them out of my skin, I scratch so bad I have holes in my legs".   Patient reported worsening depressive symptoms. Patient reported history of psych hospitalizations and suicide attempts, however patient unable to recall timeframe and events. Patient denied access to guns. Patient was anxious and cooperative during assessment. Patient is unable to contract for safety.   Chief Complaint:  Chief Complaint  Patient presents with    Hallucinations   Visit Diagnosis:  Major depressive disorder   CCA Screening, Triage and Referral (STR)  Patient Reported Information How did you hear about [place]? Family/Friend  What Is the Reason for Your Visit/Call Today? Pt presents to [facility] voluntarily dropped off by his brother. Pt states he doesn't want to live anymore. Pt states he tried to kill himself and it didn't work. Pt states that he is hallucinating and likes to stay backed in a corner so nothing can get him. Pt denies HI and alcohol at this present time. Pt states that he has a bookbag full of medication, but he doesn't think that they will kill him which is why he hasn't taken them.  How Long Has This Been Causing You Problems? > than 6 months  What Do You Feel Would Help You the Most Today? Social Support; Treatment for Depression or other mood problem; Medication(s)   Have You Recently Had Any Thoughts About Hurting Yourself? Yes  Are You Planning to Commit Suicide/Harm Yourself At This time? Yes   Flowsheet Row ED from 06/21/2023 in [facility] ED to Hosp-Admission (Discharged) from 05/25/2023 in [place] [facility]LO[facility] ED from 05/19/2023 in [facility] Emergency Department at [facility]  C-SSRS RISK CATEGORY High Risk No Risk No Risk       Have you Recently Had Thoughts About Hurting Someone Karolee Ohs? No  Are You Planning to Harm Someone at This Time? No  Explanation: n/a   Have You Used Any Alcohol or Drugs in the Past 24 Hours? Yes  What Did You Use and How Much? doesn't know what he took   Do You Currently Have a Therapist/Psychiatrist?  None  Name of Therapist/Psychiatrist: Name of Therapist/Psychiatrist: none   Have You Been Recently Discharged From Any Office Practice or Programs? No  Explanation of Discharge From Practice/Program: n/a     CCA Screening Triage Referral Assessment Type of Contact: Face-to-Face  Telemedicine Service  Delivery:  n/a Is this Initial or Reassessment?  N/a Date Telepsych consult ordered in [facility]:   N/a Time Telepsych consult ordered in [facility]:   N/a Location of Assessment: [facility] Assessment Services  Provider Location: [facility] Assessment Services   Collateral Involvement: none reported   Does Patient Have a Automotive engineer Guardian? No  Legal Guardian Contact Information: n/a  Copy of Legal Guardianship Form: -- (n/a)  Legal Guardian Notified of Arrival: -- (n/a)  Legal Guardian Notified of Pending Discharge: -- (n/a)  If Minor and Not Living with Parent(s), Who has Custody? n/a  Is CPS involved or ever been involved? Never Is APS involved or ever been involved? Never   Patient Determined To Be At Risk for Harm To Self or Others Based on Review of Patient Reported Information or Presenting Complaint? Yes, for Self-Harm  Method: Plan with intent and identified person  Availability of Means: In hand or used  Intent: Clearly intends on inflicting harm that could cause death  Notification Required: Another person is identifiable and needs to be warned to ensure safety (DUTY TO WARN)  Additional Information for Danger to Others Potential: -- (n/a)  Additional Comments for Danger to Others Potential: n/a  Are There Guns or Other Weapons in Your Home? Yes  Types of Guns/Weapons: n/a  Are These Weapons Safely Secured?                            -- (n/a)  Who Could Verify You Are Able To Have These Secured: n/a  Do You Have any Outstanding Charges, Pending Court Dates, Parole/Probation? none reported  Contacted To Inform of Risk of Harm To Self or Others: Family/Significant Other:    Does Patient Present under Involuntary Commitment? No    [place] of Residence: [place]   Patient Currently Receiving the Following Services: Not Receiving Services   Determination of Need: Urgent (48 hours)   Options For Referral: Medication Management; Inpatient  Hospitalization; Outpatient Therapy     CCA Biopsychosocial Patient Reported Schizophrenia/Schizoaffective Diagnosis in Past: Yes   Strengths: self-awareness   Mental Health Symptoms Depression:   Increase/decrease in appetite; Hopelessness; Fatigue; Difficulty Concentrating; Change in energy/activity; Worthlessness   Duration of Depressive symptoms:  Duration of Depressive Symptoms: Greater than two weeks   Mania:   None   Anxiety:    Worrying; Tension; Sleep; Restlessness; Irritability; Fatigue; Difficulty concentrating   Psychosis:   Hallucinations   Duration of Psychotic symptoms:  Duration of Psychotic Symptoms: Greater than six months   Trauma:   -- (unable to assess)   Obsessions:   None   Compulsions:   None   Inattention:   None   Hyperactivity/Impulsivity:   None   Oppositional/Defiant Behaviors:   None   Emotional Irregularity:   None   Other Mood/Personality Symptoms:   n/a    Mental Status Exam Appearance and self-care  Stature:   Average   Weight:   Average weight   Clothing:   Disheveled   Grooming:   Neglected   Cosmetic use:   None   Posture/gait:   Normal (patient is in a wheelchair)   Motor activity:   Not  Remarkable   Sensorium  Attention:   Normal   Concentration:   Normal   Orientation:   X5   Recall/memory:   Defective in Immediate; Defective in Short-term; Defective in Recent; Defective in Remote   Affect and Mood  Affect:   Depressed; Anxious   Mood:   Hopeless; Depressed; Anxious   Relating  Eye contact:   Normal   Facial expression:   Responsive; Depressed   Attitude toward examiner:   Cooperative   Thought and Language  Speech flow:  Slow; Paucity   Thought content:   Appropriate to Mood and Circumstances   Preoccupation:   None   Hallucinations:   Auditory; Visual   Organization:   Patent examiner of Knowledge:   Average   Intelligence:    Average   Abstraction:   Functional   Judgement:   Impaired   Reality Testing:   Adequate   Insight:   Fair   Decision Making:   Impulsive   Social Functioning  Social Maturity:   Impulsive   Social Judgement:   Naive   Stress  Stressors:   Housing; Relationship; Transitions; Illness   Coping Ability:   Overwhelmed   Skill Deficits:   Decision making; Self-control   Supports:   Family     Religion: Religion/Spirituality Are You A Religious Person?:  ("don't know") What is Your Religious Affiliation?:  (n/a) How Might This Affect Treatment?: n/a  Leisure/Recreation: Leisure / Recreation Do You Have Hobbies?: Yes Leisure and Hobbies: wood working  Exercise/Diet: Exercise/Diet Do You Exercise?: No Have You Gained or Lost A Significant Amount of Weight in the Past Six Months?: No Do You Follow a Special Diet?: No Do You Have Any Trouble Sleeping?: Yes Explanation of Sleeping Difficulties: "few hours"   CCA Employment/Education Employment/Work Situation: Employment / Work Situation Employment Situation: On disability Why is Patient on Disability: medical How Long has Patient Been on Disability: since 65 Patient's Job has Been Impacted by Current Illness: Yes Describe how Patient's Job has Been Impacted: unable to assess Has Patient ever Been in the [employer]?: No  Education: Education Is Patient Currently Attending School?: No Last Grade Completed: 12 Did You Attend College?: No Did You Have An Individualized Education Program (IIEP): No Did You Have Any Difficulty At School?: No Patient's Education Has Been Impacted by Current Illness: No   CCA Family/Childhood History Family and Relationship History: Family history Marital status: Divorced Divorced, when?: 30 What types of issues is patient dealing with in the relationship?: unable to assess Additional relationship information: n/a Does patient have children?: Yes How many  children?: 1 How is patient's relationship with their children?: estranged  Childhood History:  Childhood History By whom was/is the patient raised?: Mother Did patient suffer any verbal/emotional/physical/sexual abuse as a child?: No Did patient suffer from severe childhood neglect?: No Has patient ever been sexually abused/assaulted/raped as an adolescent or adult?: No Was the patient ever a victim of a crime or a disaster?: No Witnessed domestic violence?: No Has patient been affected by domestic violence as an adult?: No       CCA Substance Use Alcohol/Drug Use: Alcohol / Drug Use Pain Medications: see MAR Prescriptions: see MAR Over the Counter: see MAR History of alcohol / drug use?: No history of alcohol / drug abuse Longest period of sobriety (when/how long): n/a Negative Consequences of Use:  (n/a) Withdrawal Symptoms:  (n/a)  ASAM's:  Six Dimensions of Multidimensional Assessment  Dimension 1:  Acute Intoxication and/or Withdrawal Potential:   Dimension 1:  Description of individual's past and current experiences of substance use and withdrawal: n/a  Dimension 2:  Biomedical Conditions and Complications:   Dimension 2:  Description of patient's biomedical conditions and  complications: n/a  Dimension 3:  Emotional, Behavioral, or Cognitive Conditions and Complications:  Dimension 3:  Description of emotional, behavioral, or cognitive conditions and complications: n/a  Dimension 4:  Readiness to Change:  Dimension 4:  Description of Readiness to Change criteria: n/a  Dimension 5:  Relapse, Continued use, or Continued Problem Potential:  Dimension 5:  Relapse, continued use, or continued problem potential critiera description: n/a  Dimension 6:  Recovery/Living Environment:  Dimension 6:  Recovery/Iiving environment criteria description: n/a  ASAM Severity Score:    ASAM Recommended Level of Treatment: ASAM Recommended Level of  Treatment:  (n/a)   Substance use Disorder (SUD) Substance Use Disorder (SUD)  Checklist Symptoms of Substance Use:  (n/a)  Recommendations for Services/Supports/Treatments: Recommendations for Services/Supports/Treatments Recommendations For Services/Supports/Treatments: Inpatient Hospitalization, Individual Therapy, Medication Management  Discharge Disposition: Discharge Disposition Medical Exam completed: Yes Disposition of Patient: Admit  DSM5 Diagnoses: Patient Active Problem List   Diagnosis Date Noted   Major depressive disorder 05/30/2023   Cocaine abuse (HCC) 05/28/2023   Acute urinary retention 05/28/2023   Prostate abscess 05/27/2023   Acute UTI 05/26/2023   Suicidal ideation 05/26/2023   Opiate overdose (HCC) 02/24/2023   Polysubstance abuse (HCC) 02/24/2023   Homeless 02/24/2023   Hypotension 02/14/2023   Altered mental status    Agitation    Encephalopathy acute 05/24/2020   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

## 2023-06-21 NOTE — ED Provider Notes (Signed)
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  Per the patient he has been seeing and hearing things, patient stated I do not want to hurt no one or myself.  According to patient he was living with a friend but when asked if he can go back to the friend's house patient stated no so patient is currently homeless.  Patient reports that he is currently not taking any medication.  He stopped taking them a while ago.  Patient stated that he was just going to take his medicine and OD on them.  However patient stated he did not want to do that so he want a safe place where he can live.  A review of patient records show multiple visits including 1 where he OD'd on opioids.   Copied from triage notes: Pt presents to [facility] voluntarily dropped off by his brother. Pt states he doesn't want to live anymore. Pt states he tried to kill himself and it didn't work. Pt states that he is hallucinating and likes to stay backed in a corner so nothing can get him. Pt denies HI and alcohol at this present time. Pt states that he has a bookbag full of medication, but he doesn't think that they will kill him which is why he hasn't taken them.   Face-to-face evaluation of patient, patient is alert and oriented to person place and time.  Maintained minimal eye contact.  Patient ambulates via wheelchair.  Patient appearance is very disheveled unkept.  At first patient stated he was not suicidal but then he stated that if he get discharged he is going to go and take his medicine and kill himself.  Patient denies HI, reports hearing voices but  could not tell me what the voices were saying.  When asked if he drinks alcohol patient denies, patient also denies illicit drug use but reported that he smokes cigarettes.  Given patient complex medical history which also include incomplete quadriplegia and spinal injury.  Pt will be sent to [facility] for higher level of care.  Writer spoke with Dr Jacqulyn Bath MD who will be accepting patient.    Total Time spent with patient: 20 minutes  Musculoskeletal  Strength & Muscle Tone: within normal limits Gait & Station: normal Patient leans: N/A  Psychiatric Specialty Exam  Presentation General Appearance:  Disheveled  Eye Contact: Fair  Speech: Blocked  Speech Volume: Decreased  Handedness: Right   Mood and Affect  Mood: Euthymic  Affect: Congruent   Thought Process  Thought Processes: Linear  Descriptions of Associations:Intact  Orientation:Full (Time, Place and Person)  Thought Content:WDL    Hallucinations:Hallucinations: None  Ideas of Reference:None  Suicidal Thoughts:Suicidal Thoughts: Yes, Passive SI Passive Intent and/or Plan: With Intent; With Plan  Homicidal Thoughts:Homicidal Thoughts: No   Sensorium  Memory: Immediate Fair  Judgment: Poor  Insight: Fair   Chartered certified accountant: Fair  Attention Span: Fair  Recall: [employer] of Knowledge: Fair  Language: Fair   Psychomotor Activity  Psychomotor Activity: Psychomotor Activity: Normal   Assets  Assets: Desire for Improvement; Housing   Sleep  Sleep: Sleep: Fair  Number of Hours of Sleep: 5   Nutritional Assessment (For OBS and FBC admissions only) Has the patient had a weight loss or gain of 10 pounds or more in the last 3 months?: No Has the patient had a decrease in food intake/or appetite?: No Does the patient have dental problems?: No Does the patient have eating habits or behaviors that may be indicators of an eating disorder including binging or  inducing vomiting?: No Has the patient recently lost weight without trying?: 0 Has the patient been eating poorly because of a decreased appetite?: 0 Malnutrition Screening Tool Score: 0    Physical Exam HENT:     Head: Normocephalic.     Nose: Nose normal.  Eyes:     Pupils: Pupils are equal, round, and reactive to light.  Cardiovascular:     Rate and Rhythm: Normal rate.  Pulmonary:     Effort: Pulmonary effort is normal.  Musculoskeletal:        General: Normal range of motion.     Cervical back: Normal range of motion.  Neurological:     General: No focal deficit present.     Mental Status: He is alert.  Psychiatric:        Mood and Affect: Mood normal.        Behavior: Behavior normal.        Thought Content: Thought content normal.        Judgment: Judgment normal.    Review of Systems  Constitutional: Negative.   HENT: Negative.    Eyes: Negative.   Respiratory: Negative.    Cardiovascular: Negative.   Gastrointestinal: Negative.   Genitourinary: Negative.   Musculoskeletal: Negative.   Skin: Negative.   Neurological: Negative.   Psychiatric/Behavioral:  Positive for suicidal ideas. The patient is nervous/anxious.     Blood pressure 130/76, pulse 81, temperature 97.9 F (36.6 C), temperature source Oral, resp. rate 18, SpO2 97%. There is no height or weight on file to calculate BMI.  Past Psychiatric History: polysubstance abuse,  OD,    Is the patient at risk to self? Yes  Has the patient been a risk to self in the past 6 months? Yes .    Has the patient been a risk to self within the distant past? Yes   Is the patient a risk to others? No   Has the patient been a risk to others in the past 6 months? No   Has the patient been a risk to others within the distant past? No   Past Medical History: see chart   Family History: unknown   Social History: tobacco products.   Last Labs:  No results displayed because visit has over 200 results.     Admission on 05/19/2023, Discharged on 05/19/2023  Component Date Value Ref Range Status   WBC 05/19/2023 13.7 (H)  4.0 - 10.5 K/uL Final   RBC 05/19/2023 5.08  4.22 - 5.81 MIL/uL Final   Hemoglobin 05/19/2023 14.0  13.0 - 17.0 g/dL Final   HCT 91/47/8295 41.8  39.0 - 52.0 % Final   MCV 05/19/2023 82.3  80.0 - 100.0 fL Final   MCH 05/19/2023 27.6  26.0 - 34.0 pg Final   MCHC 05/19/2023 33.5  30.0 - 36.0 g/dL Final   RDW 62/13/0865 15.6 (H)  11.5 - 15.5 % Final   Platelets 05/19/2023 572 (H)  150 - 400 K/uL Final   nRBC 05/19/2023 0.0  0.0 - 0.2 % Final   Neutrophils Relative %  05/19/2023 74  % Final   Neutro Abs 05/19/2023 10.4 (H)  1.7 - 7.7 K/uL Final   Lymphocytes Relative 05/19/2023 15  % Final   Lymphs Abs 05/19/2023 2.0  0.7 - 4.0 K/uL Final   Monocytes Relative 05/19/2023 8  % Final   Monocytes Absolute 05/19/2023 1.1 (H)  0.1 - 1.0 K/uL Final   Eosinophils Relative 05/19/2023 1  % Final   Eosinophils Absolute 05/19/2023 0.1  0.0 - 0.5 K/uL Final   Basophils Relative 05/19/2023 1  % Final   Basophils Absolute 05/19/2023 0.1  0.0 - 0.1 K/uL Final   Immature Granulocytes 05/19/2023 1  % Final   Abs Immature Granulocytes 05/19/2023 0.08 (H)  0.00 - 0.07 K/uL Final   Performed at [facility], [address], [place], [place] [address]   Sodium 05/19/2023 137  135 - 145 mmol/L Final   Potassium 05/19/2023 3.1 (L)  3.5 - 5.1 mmol/L Final   Chloride 05/19/2023 107  98 - 111 mmol/L Final   CO2 05/19/2023 19 (L)  22 - 32 mmol/L Final   Glucose, Bld 05/19/2023 114 (H)  70 - 99 mg/dL Final   Glucose reference range applies only to samples taken after fasting for at least 8 hours.   BUN 05/19/2023 10  6 - 20 mg/dL Final   Creatinine, Ser 05/19/2023 0.85  0.61 - 1.24 mg/dL Final   Calcium 19/14/7829 9.0  8.9 - 10.3 mg/dL Final   GFR, Estimated 05/19/2023 >60  >60 mL/min Final   Comment: (NOTE) Calculated using the CKD-EPI Creatinine Equation (2021)    Anion gap 05/19/2023 11   5 - 15 Final   Performed at [facility], [address] [place], [place] [address]   Color, Urine 05/19/2023 AMBER (A)  YELLOW Final   BIOCHEMICALS MAY BE AFFECTED BY COLOR   APPearance 05/19/2023 CLEAR  CLEAR Final   Specific Gravity, Urine 05/19/2023 1.015  1.005 - 1.030 Final   pH 05/19/2023 6.0  5.0 - 8.0 Final   Glucose, UA 05/19/2023 NEGATIVE  NEGATIVE mg/dL Final   Hgb urine dipstick 05/19/2023 SMALL (A)  NEGATIVE Final   Bilirubin Urine 05/19/2023 NEGATIVE  NEGATIVE Final   Ketones, ur 05/19/2023 NEGATIVE  NEGATIVE mg/dL Final   Protein, ur 08/65/7846 NEGATIVE  NEGATIVE mg/dL Final   Nitrite 96/29/5284 NEGATIVE  NEGATIVE Final   Leukocytes,Ua 05/19/2023 MODERATE (A)  NEGATIVE Final   RBC / HPF 05/19/2023 21-50  0 - 5 RBC/hpf Final   WBC, UA 05/19/2023 21-50  0 - 5 WBC/hpf Final   Bacteria, UA 05/19/2023 RARE (A)  NONE SEEN Final   Squamous Epithelial / HPF 05/19/2023 0-5  0 - 5 /HPF Final   Mucus 05/19/2023 PRESENT   Final   Performed at [facility], [address], [place], [place] [address]   Specimen Description 05/19/2023 URINE, CLEAN CATCH   Final   Special Requests 05/19/2023 NONE   Final   Culture 05/19/2023    Final                   Value:NO GROWTH Performed at [facility], [address] [place], [place] [address]    Report Status 05/19/2023 05/20/2023 FINAL   Final  Admission on 05/06/2023, Discharged on 05/06/2023  Component Date Value Ref Range Status   WBC 05/06/2023 9.7  4.0 - 10.5 K/uL Final   RBC 05/06/2023 5.47  4.22 - 5.81 MIL/uL Final   Hemoglobin 05/06/2023  14.8  13.0 - 17.0 g/dL Final   HCT 09/81/1914 44.7  39.0 - 52.0 % Final   MCV 05/06/2023 81.7  80.0 - 100.0 fL Final   MCH 05/06/2023 27.1  26.0 - 34.0 pg Final   MCHC 05/06/2023 33.1  30.0 - 36.0 g/dL Final   RDW 78/29/5621 14.8  11.5 - 15.5 % Final   Platelets 05/06/2023 512 (H)  150 - 400 K/uL Final   nRBC 05/06/2023 0.0  0.0 - 0.2 % Final   Performed at [facility], [address], [place], [place] [address]   Sodium 05/06/2023 137  135 - 145 mmol/L Final   Potassium 05/06/2023 3.7  3.5 - 5.1 mmol/L Final   Chloride 05/06/2023 109  98 - 111 mmol/L Final   CO2 05/06/2023 21 (L)  22 - 32 mmol/L Final   Glucose, Bld 05/06/2023 102 (H)  70 - 99 mg/dL Final   Glucose reference range applies only to samples taken after fasting for at least 8 hours.   BUN 05/06/2023 10  6 - 20 mg/dL Final   Creatinine, Ser 05/06/2023 0.64  0.61 - 1.24 mg/dL Final   Calcium 78/46/9629 8.9  8.9 - 10.3 mg/dL Final   GFR, Estimated 05/06/2023 >60  >60 mL/min Final   Comment: (NOTE) Calculated using the CKD-EPI Creatinine Equation (2021)    Anion gap 05/06/2023 7  5 - 15 Final   Performed at [facility], [address], [place], [place] [address]   Specimen Source 05/06/2023 URINE, CLEAN CATCH   Final   Color, Urine 05/06/2023 YELLOW  YELLOW Final   APPearance 05/06/2023 CLEAR  CLEAR Final   Specific Gravity, Urine 05/06/2023 1.012  1.005 - 1.030 Final   pH 05/06/2023 7.0  5.0 - 8.0 Final   Glucose, UA 05/06/2023 NEGATIVE  NEGATIVE mg/dL Final   Hgb urine dipstick 05/06/2023 LARGE (A)  NEGATIVE Final   Bilirubin Urine 05/06/2023 NEGATIVE  NEGATIVE Final   Ketones, ur 05/06/2023 NEGATIVE  NEGATIVE mg/dL Final   Protein, ur 32/44/0102 NEGATIVE  NEGATIVE mg/dL Final   Nitrite 72/53/6644 NEGATIVE  NEGATIVE Final   Leukocytes,Ua 05/06/2023 LARGE (A)  NEGATIVE Final   RBC / HPF 05/06/2023 >50  0 - 5 RBC/hpf Final   WBC, UA 05/06/2023 11-20  0 - 5 WBC/hpf Final   Comment:        Reflex urine culture not performed if WBC <=10, OR if Squamous epithelial cells >5. If Squamous epithelial cells >5 suggest recollection.    Bacteria, UA 05/06/2023 RARE (A)  NONE SEEN Final   Squamous Epithelial / HPF 05/06/2023 0-5  0 - 5 /HPF Final   Mucus 05/06/2023 PRESENT   Final   Performed at [facility], [address], [place], [place] [address]   Specimen  Description 05/06/2023 URINE, RANDOM   Final   Special Requests 05/06/2023    Final                   Value:NONE Reflexed from [id number] Performed at [facility], [address], [place], [place] [address]    Culture 05/06/2023 >=100,000 COLONIES/mL METHICILLIN RESISTANT STAPHYLOCOCCUS AUREUS (A)   Final   Report Status 05/06/2023 05/08/2023 FINAL   Final   Organism ID, Bacteria 05/06/2023 METHICILLIN RESISTANT STAPHYLOCOCCUS AUREUS (A)   Final  Admission on 04/15/2023, Discharged on 04/16/2023  Component Date Value Ref Range Status   WBC 04/15/2023 11.9 (H)  4.0 - 10.5 K/uL Final  RBC 04/15/2023 5.90 (H)  4.22 - 5.81 MIL/uL Final   Hemoglobin 04/15/2023 15.8  13.0 - 17.0 g/dL Final   HCT 65/78/4696 49.3  39.0 - 52.0 % Final   MCV 04/15/2023 83.6  80.0 - 100.0 fL Final   MCH 04/15/2023 26.8  26.0 - 34.0 pg Final   MCHC 04/15/2023 32.0  30.0 - 36.0 g/dL Final   RDW 29/52/8413 14.9  11.5 - 15.5 % Final   Platelets 04/15/2023 577 (H)  150 - 400 K/uL Final   nRBC 04/15/2023 0.0  0.0 - 0.2 % Final   Neutrophils Relative % 04/15/2023 73  % Final   Neutro Abs 04/15/2023 8.7 (H)  1.7 - 7.7 K/uL Final   Lymphocytes Relative 04/15/2023 16  % Final   Lymphs Abs 04/15/2023 1.9  0.7 - 4.0 K/uL Final   Monocytes Relative 04/15/2023 10  % Final   Monocytes Absolute 04/15/2023 1.2 (H)  0.1 - 1.0 K/uL Final   Eosinophils Relative 04/15/2023 0  % Final   Eosinophils Absolute 04/15/2023 0.0  0.0 - 0.5 K/uL Final   Basophils Relative 04/15/2023 1  % Final   Basophils Absolute 04/15/2023 0.1  0.0 - 0.1 K/uL Final   Immature Granulocytes 04/15/2023 0  % Final   Abs Immature Granulocytes 04/15/2023 0.04  0.00 - 0.07 K/uL Final   Performed at [facility], [address], [place], [place] [address]   Sodium 04/15/2023 140  135 - 145 mmol/L Final   Potassium 04/15/2023 3.4 (L)  3.5 - 5.1 mmol/L Final   HEMOLYSIS AT THIS LEVEL MAY AFFECT RESULT   Chloride 04/15/2023 113 (H)  98 - 111 mmol/L  Final   CO2 04/15/2023 14 (L)  22 - 32 mmol/L Final   Glucose, Bld 04/15/2023 85  70 - 99 mg/dL Final   Glucose reference range applies only to samples taken after fasting for at least 8 hours.   BUN 04/15/2023 14  6 - 20 mg/dL Final   Creatinine, Ser 04/15/2023 0.59 (L)  0.61 - 1.24 mg/dL Final   Calcium 02/72/5366 7.3 (L)  8.9 - 10.3 mg/dL Final   Total Protein 44/09/4740 6.5  6.5 - 8.1 g/dL Final   Albumin 59/56/3875 2.8 (L)  3.5 - 5.0 g/dL Final   AST 64/33/2951 68 (H)  15 - 41 U/L Final   HEMOLYSIS AT THIS LEVEL MAY AFFECT RESULT   ALT 04/15/2023 63 (H)  0 - 44 U/L Final   HEMOLYSIS AT THIS LEVEL MAY AFFECT RESULT   Alkaline Phosphatase 04/15/2023 59  38 - 126 U/L Final   Total Bilirubin 04/15/2023 0.7  0.3 - 1.2 mg/dL Final   HEMOLYSIS AT THIS LEVEL MAY AFFECT RESULT   GFR, Estimated 04/15/2023 >60  >60 mL/min Final   Comment: (NOTE) Calculated using the CKD-EPI Creatinine Equation (2021)    Anion gap 04/15/2023 13  5 - 15 Final   Performed at [facility], [address], [place], [place] [address]   Magnesium 04/15/2023 1.7  1.7 - 2.4 mg/dL Final   Performed at [facility], [address] [place], [place] [address]   SARS Coronavirus 2 by RT PCR 04/15/2023 NEGATIVE  NEGATIVE Final   Influenza A by PCR 04/15/2023 NEGATIVE  NEGATIVE Final   Influenza B by PCR 04/15/2023 NEGATIVE  NEGATIVE Final   Comment: (NOTE) The Xpert Xpress SARS-CoV-2/FLU/RSV plus assay is intended as an aid in the diagnosis of influenza from Nasopharyngeal swab specimens and should  not be used as a sole basis for treatment. Nasal washings and aspirates are unacceptable for Xpert Xpress SARS-CoV-2/FLU/RSV testing.  Fact Sheet for Patients: BloggerCourse.com  Fact Sheet for Healthcare Providers: SeriousBroker.it  This test is not yet approved or cleared by the [place] FDA and has been authorized for detection and/or diagnosis of  SARS-CoV-2 by FDA under an Emergency Use Authorization (EUA). This EUA will remain in effect (meaning this test can be used) for the duration of the COVID-19 declaration under Section 564(b)(1) of the Act, 21 U.S.C. section 360bbb-3(b)(1), unless the authorization is terminated or revoked.     Resp Syncytial Virus by PCR 04/15/2023 NEGATIVE  NEGATIVE Final   Comment: (NOTE) Fact Sheet for Patients: BloggerCourse.com  Fact Sheet for Healthcare Providers: SeriousBroker.it  This test is not yet approved or cleared by the [place] FDA and has been authorized for detection and/or diagnosis of SARS-CoV-2 by FDA under an Emergency Use Authorization (EUA). This EUA will remain in effect (meaning this test can be used) for the duration of the COVID-19 declaration under Section 564(b)(1) of the Act, 21 U.S.C. section 360bbb-3(b)(1), unless the authorization is terminated or revoked.  Performed at [facility], [address], [place], [place] [address]    Troponin I (High Sensitivity) 04/15/2023 4  <18 ng/L Final   Comment: (NOTE) Elevated high sensitivity troponin I (hsTnI) values and significant  changes across serial measurements may suggest ACS but many other  chronic and acute conditions are known to elevate hsTnI results.  Refer to the "Links" section for chest pain algorithms and additional  guidance. Performed at [facility], [address] [place], [place] [address]   Admission on 02/27/2023, Discharged on 02/27/2023  Component Date Value Ref Range Status   Sodium 02/27/2023 138  135 - 145 mmol/L Final   Potassium 02/27/2023 4.0  3.5 - 5.1 mmol/L Final   Chloride 02/27/2023 104  98 - 111 mmol/L Final   CO2 02/27/2023 19 (L)  22 - 32 mmol/L Final   Glucose, Bld 02/27/2023 113 (H)  70 - 99 mg/dL Final   Glucose reference range applies only to samples taken after fasting for at least 8 hours.   BUN 02/27/2023  19  6 - 20 mg/dL Final   Creatinine, Ser 02/27/2023 0.91  0.61 - 1.24 mg/dL Final   Calcium 95/28/4132 9.0  8.9 - 10.3 mg/dL Final   Total Protein 44/07/270 8.3 (H)  6.5 - 8.1 g/dL Final   Albumin 53/66/4403 3.3 (L)  3.5 - 5.0 g/dL Final   AST 47/42/5956 94 (H)  15 - 41 U/L Final   ALT 02/27/2023 123 (H)  0 - 44 U/L Final   Alkaline Phosphatase 02/27/2023 130 (H)  38 - 126 U/L Final   Total Bilirubin 02/27/2023 1.3 (H)  0.3 - 1.2 mg/dL Final   GFR, Estimated 02/27/2023 >60  >60 mL/min Final   Comment: (NOTE) Calculated using the CKD-EPI Creatinine Equation (2021)    Anion gap 02/27/2023 15  5 - 15 Final   Performed at [facility], [address], [place], [place] [address]   WBC 02/27/2023 14.6 (H)  4.0 - 10.5 K/uL Final   RBC 02/27/2023 5.49  4.22 - 5.81 MIL/uL Final   Hemoglobin 02/27/2023 14.5  13.0 - 17.0 g/dL Final   HCT 43/32/9518 44.2  39.0 - 52.0 % Final   MCV 02/27/2023 80.5  80.0 - 100.0 fL Final   MCH 02/27/2023 26.4  26.0 -  34.0 pg Final   MCHC 02/27/2023 32.8  30.0 - 36.0 g/dL Final   RDW 16/04/9603 14.3  11.5 - 15.5 % Final   Platelets 02/27/2023 608 (H)  150 - 400 K/uL Final   nRBC 02/27/2023 0.0  0.0 - 0.2 % Final   Neutrophils Relative % 02/27/2023 61  % Final   Neutro Abs 02/27/2023 9.0 (H)  1.7 - 7.7 K/uL Final   Lymphocytes Relative 02/27/2023 24  % Final   Lymphs Abs 02/27/2023 3.5  0.7 - 4.0 K/uL Final   Monocytes Relative 02/27/2023 13  % Final   Monocytes Absolute 02/27/2023 1.8 (H)  0.1 - 1.0 K/uL Final   Eosinophils Relative 02/27/2023 1  % Final   Eosinophils Absolute 02/27/2023 0.1  0.0 - 0.5 K/uL Final   Basophils Relative 02/27/2023 0  % Final   Basophils Absolute 02/27/2023 0.1  0.0 - 0.1 K/uL Final   Immature Granulocytes 02/27/2023 1  % Final   Abs Immature Granulocytes 02/27/2023 0.11 (H)  0.00 - 0.07 K/uL Final   Performed at [facility], [address], [place], [place] [address]   Lactic Acid, Venous 02/27/2023 1.6  0.5 - 1.9  mmol/L Final   Alcohol, Ethyl (B) 02/27/2023 <10  <10 mg/dL Final   Comment: (NOTE) Lowest detectable limit for serum alcohol is 10 mg/dL.  For medical purposes only. Performed at [facility], [address], [place], [place] [address]    Salicylate Lvl 02/27/2023 <7.0 (L)  7.0 - 30.0 mg/dL Final   Performed at [facility], [address], [place], [place] [address]   Acetaminophen (Tylenol), Serum 02/27/2023 <10 (L)  10 - 30 ug/mL Final   Comment: (NOTE) Therapeutic concentrations vary significantly. A range of 10-30 ug/mL  may be an effective concentration for many patients. However, some  are best treated at concentrations outside of this range. Acetaminophen concentrations >150 ug/mL at 4 hours after ingestion  and >50 ug/mL at 12 hours after ingestion are often associated with  toxic reactions.  Performed at [facility], [address], [place], [place] [address]    Total CK 02/27/2023 138  49 - 397 U/L Final   Performed at [facility], [address], [place], [place] [address]  Admission on 02/14/2023, Discharged on 02/24/2023  Component Date Value Ref Range Status   Glucose-Capillary 02/14/2023 99  70 - 99 mg/dL Final   Glucose reference range applies only to samples taken after fasting for at least 8 hours.   Salicylate Lvl 02/14/2023 <7.0 (L)  7.0 - 30.0 mg/dL Final   Performed at [facility], [address], [place], [place] [address]   Acetaminophen (Tylenol), Serum 02/14/2023 <10 (L)  10 - 30 ug/mL Final   Comment: (NOTE) Therapeutic concentrations vary significantly. A range of 10-30 ug/mL  may be an effective concentration for many patients. However, some  are best treated at concentrations outside of this range. Acetaminophen concentrations >150 ug/mL at 4 hours after ingestion  and >50 ug/mL at 12 hours after ingestion are often associated with  toxic reactions.  Performed at [facility], [address] [place], [place] [address]    Alcohol, Ethyl (B) 02/14/2023 <10  <10 mg/dL Final   Comment: (NOTE) Lowest detectable limit for serum alcohol is 10 mg/dL.  For medical purposes only. Performed at [facility], [address] [place], [place] [address]    Opiates 02/14/2023  POSITIVE (A)  NONE DETECTED Final   Cocaine 02/14/2023 POSITIVE (A)  NONE DETECTED Final   Benzodiazepines 02/14/2023 POSITIVE (A)  NONE DETECTED Final   Amphetamines 02/14/2023 NONE DETECTED  NONE DETECTED Final   Tetrahydrocannabinol 02/14/2023 NONE DETECTED  NONE DETECTED Final   Barbiturates 02/14/2023 NONE DETECTED  NONE DETECTED Final   Comment: (NOTE) DRUG SCREEN FOR MEDICAL PURPOSES ONLY.  IF CONFIRMATION IS NEEDED FOR ANY PURPOSE, NOTIFY LAB WITHIN 5 DAYS.  LOWEST DETECTABLE LIMITS FOR URINE DRUG SCREEN Drug Class                     Cutoff (ng/mL) Amphetamine and metabolites    1000 Barbiturate and metabolites    200 Benzodiazepine                 200 Opiates and metabolites        300 Cocaine and metabolites        300 THC                            50 Performed at [facility], [address] [place], [place] [address]    WBC 02/14/2023 8.9  4.0 - 10.5 K/uL Final   RBC 02/14/2023 5.34  4.22 - 5.81 MIL/uL Final   Hemoglobin 02/14/2023 14.8  13.0 - 17.0 g/dL Final   HCT 60/45/4098 44.3  39.0 - 52.0 % Final   MCV 02/14/2023 83.0  80.0 - 100.0 fL Final   MCH 02/14/2023 27.7  26.0 - 34.0 pg Final   MCHC 02/14/2023 33.4  30.0 - 36.0 g/dL Final   RDW 11/91/4782 13.6  11.5 - 15.5 % Final   Platelets 02/14/2023 509 (H)  150 - 400 K/uL Final   nRBC 02/14/2023 0.0  0.0 - 0.2 % Final   Neutrophils Relative % 02/14/2023 62  % Final   Neutro Abs 02/14/2023 5.6  1.7 - 7.7 K/uL Final   Lymphocytes Relative 02/14/2023 22  % Final   Lymphs Abs 02/14/2023 2.0  0.7 - 4.0 K/uL Final   Monocytes Relative 02/14/2023 11  % Final   Monocytes Absolute 02/14/2023  1.0  0.1 - 1.0 K/uL Final   Eosinophils Relative 02/14/2023 4  % Final   Eosinophils Absolute 02/14/2023 0.3  0.0 - 0.5 K/uL Final   Basophils Relative 02/14/2023 1  % Final   Basophils Absolute 02/14/2023 0.1  0.0 - 0.1 K/uL Final   Immature Granulocytes 02/14/2023 0  % Final   Abs Immature Granulocytes 02/14/2023 0.04  0.00 - 0.07 K/uL Final   Performed at [facility], [address], [place], [place] [address]   Sodium 02/14/2023 142  135 - 145 mmol/L Final   Potassium 02/14/2023 3.8  3.5 - 5.1 mmol/L Final   Chloride 02/14/2023 104  98 - 111 mmol/L Final   BUN 02/14/2023 12  6 - 20 mg/dL Final   Creatinine, Ser 02/14/2023 0.90  0.61 - 1.24 mg/dL Final   Glucose, Bld 30/86/5784 87  70 - 99 mg/dL Final   Glucose reference range applies only to samples taken after fasting for at least 8 hours.   Calcium, Ion 02/14/2023 1.12 (L)  1.15 - 1.40 mmol/L Final   TCO2 02/14/2023 23  22 - 32 mmol/L Final   Hemoglobin 02/14/2023 14.6  13.0 - 17.0 g/dL Final   HCT 69/62/9528 43.0  39.0 - 52.0 % Final   Sodium 02/14/2023 138  135 - 145 mmol/L Final   Potassium 02/14/2023 3.7  3.5 - 5.1 mmol/L Final   Chloride 02/14/2023 104  98 - 111 mmol/L Final   CO2 02/14/2023 22  22 - 32 mmol/L Final   Glucose, Bld 02/14/2023 90  70 - 99 mg/dL Final   Glucose reference range applies only to samples taken after fasting for at least 8 hours.   BUN 02/14/2023 13  6 - 20 mg/dL Final   Creatinine, Ser 02/14/2023 0.86  0.61 - 1.24 mg/dL Final   Calcium 16/04/9603 8.9  8.9 - 10.3 mg/dL Final   GFR, Estimated 02/14/2023 >60  >60 mL/min Final   Comment: (NOTE) Calculated using the CKD-EPI Creatinine Equation (2021)    Anion gap 02/14/2023 12  5 - 15 Final   Performed at [facility], [address], [place], [place] [address]   Total Protein 02/14/2023 8.6 (H)  6.5 - 8.1 g/dL Final   Albumin 11/91/4782 3.4 (L)  3.5 - 5.0 g/dL Final   AST 95/62/1308 144 (H)  15 - 41 U/L Final    ALT 02/14/2023 90 (H)  0 - 44 U/L Final   Alkaline Phosphatase 02/14/2023 94  38 - 126 U/L Final   Total Bilirubin 02/14/2023 1.1  0.3 - 1.2 mg/dL Final   Bilirubin, Direct 02/14/2023 0.5 (H)  0.0 - 0.2 mg/dL Final   Indirect Bilirubin 02/14/2023 0.6  0.3 - 0.9 mg/dL Final   Performed at [facility], [address], [place], [place] [address]   Color, Urine 02/14/2023 AMBER (A)  YELLOW Final   BIOCHEMICALS MAY BE AFFECTED BY COLOR   APPearance 02/14/2023 HAZY (A)  CLEAR Final   Specific Gravity, Urine 02/14/2023 1.019  1.005 - 1.030 Final   pH 02/14/2023 6.0  5.0 - 8.0 Final   Glucose, UA 02/14/2023 NEGATIVE  NEGATIVE mg/dL Final   Hgb urine dipstick 02/14/2023 SMALL (A)  NEGATIVE Final   Bilirubin Urine 02/14/2023 SMALL (A)  NEGATIVE Final   Ketones, ur 02/14/2023 5 (A)  NEGATIVE mg/dL Final   Protein, ur 69/62/9528 30 (A)  NEGATIVE mg/dL Final   Nitrite 41/32/4401 NEGATIVE  NEGATIVE Final   Leukocytes,Ua 02/14/2023 SMALL (A)  NEGATIVE Final   RBC / HPF 02/14/2023 21-50  0 - 5 RBC/hpf Final   WBC, UA 02/14/2023 >50  0 - 5 WBC/hpf Final   Bacteria, UA 02/14/2023 RARE (A)  NONE SEEN Final   Squamous Epithelial / HPF 02/14/2023 0-5  0 - 5 /HPF Final   Mucus 02/14/2023 PRESENT   Final   Hyaline Casts, UA 02/14/2023 PRESENT   Final   Performed at [facility], [address] [place], [place] [address]   pH, Ven 02/14/2023 7.4  7.25 - 7.43 Final   pCO2, Ven 02/14/2023 40 (L)  44 - 60 mmHg Final   pO2, Ven 02/14/2023 56 (H)  32 - 45 mmHg Final   Bicarbonate 02/14/2023 24.8  20.0 - 28.0 mmol/L Final   Acid-Base Excess 02/14/2023 0.0  0.0 - 2.0 mmol/L Final   O2 Saturation 02/14/2023 87.7  % Final   Patient temperature 02/14/2023 37.0   Final   Performed at [facility], [address], [place], [place] [address]   Magnesium 02/14/2023 2.2  1.7 - 2.4 mg/dL Final   Performed at [facility], [address], [place], [place] [address]   Ammonia 02/14/2023 35  9 - 35 umol/L Final   Performed at  [facility], [address] [place], [place] [address]   Lactic Acid, Venous 02/14/2023 1.6  0.5 - 1.9 mmol/L Final   Performed at [facility], [address] [place], [place] [address]   Lactic Acid, Venous 02/14/2023 1.3  0.5 - 1.9 mmol/L Final   Performed at [facility], [address], [place], [place] [address]   Troponin I (High Sensitivity) 02/14/2023 5  <18 ng/L Final   Comment: (NOTE) Elevated high sensitivity troponin I (hsTnI) values and significant  changes across serial measurements may suggest ACS but many other  chronic and acute conditions are known to elevate hsTnI results.  Refer to the "Links" section for chest pain algorithms and additional  guidance. Performed at [facility], [address] [place], [place] [address]    Troponin I (High Sensitivity) 02/14/2023 6  <18 ng/L Final   Comment: (NOTE) Elevated high sensitivity troponin I (hsTnI) values and significant  changes across serial measurements may suggest ACS but many other  chronic and acute conditions are known to elevate hsTnI results.  Refer to the "Links" section for chest pain algorithms and additional  guidance. Performed at [facility], [address] [place], [place] [address]    HIV Screen 4th Generation wRfx 02/14/2023 Non Reactive  Non Reactive Final   Performed at [facility], [address] [place], [place] [address]   Sodium 02/15/2023 135  135 - 145 mmol/L Final   Potassium 02/15/2023 4.2  3.5 - 5.1 mmol/L Final   Comment: HEMOLYSIS AT THIS LEVEL MAY AFFECT RESULT DELTA CHECK NOTED    Chloride 02/15/2023 105  98 - 111 mmol/L Final   CO2 02/15/2023 22  22 - 32 mmol/L Final   Glucose, Bld 02/15/2023 96  70 - 99 mg/dL Final   Glucose reference range applies only to samples taken after fasting for  at least 8 hours.   BUN 02/15/2023 11  6 - 20 mg/dL Final   Creatinine, Ser 02/15/2023 0.83  0.61 - 1.24 mg/dL Final   Calcium 05/08/2535 8.0 (L)  8.9 - 10.3 mg/dL Final   GFR, Estimated 02/15/2023 >60  >60 mL/min Final   Comment: (NOTE) Calculated using the CKD-EPI Creatinine Equation (2021)    Anion gap 02/15/2023 8  5 - 15 Final   Performed at [facility], [address] [place], [place] [address]   WBC 02/15/2023 30.5 (H)  4.0 - 10.5 K/uL Final   RBC 02/15/2023 4.82  4.22 - 5.81 MIL/uL Final   Hemoglobin 02/15/2023 13.4  13.0 - 17.0 g/dL Final   HCT 47/42/5956 39.4  39.0 - 52.0 % Final   MCV 02/15/2023 81.7  80.0 - 100.0 fL Final   MCH 02/15/2023 27.8  26.0 - 34.0 pg Final   MCHC 02/15/2023 34.0  30.0 - 36.0 g/dL Final   RDW 38/75/6433 13.9  11.5 - 15.5 % Final   Platelets 02/15/2023 423 (H)  150 - 400 K/uL Final   nRBC 02/15/2023 0.1  0.0 - 0.2 % Final   Performed at [facility], [address], [place], [place] [address]   Glucose-Capillary 02/15/2023 113 (H)  70 - 99 mg/dL Final   Glucose reference range applies only to samples taken after fasting for at least 8 hours.   FIO2 02/15/2023 36  % Final   O2 Content 02/15/2023 4.0  L/min Final   Delivery systems 02/15/2023 NASAL CANNULA   Final  pH, Arterial 02/15/2023 7.41  7.35 - 7.45 Final   pCO2 arterial 02/15/2023 40  32 - 48 mmHg Final   pO2, Arterial 02/15/2023 71 (L)  83 - 108 mmHg Final   Bicarbonate 02/15/2023 25.4  20.0 - 28.0 mmol/L Final   Acid-Base Excess 02/15/2023 0.7  0.0 - 2.0 mmol/L Final   O2 Saturation 02/15/2023 96.8  % Final   Patient temperature 02/15/2023 36.7   Final   Collection site 02/15/2023 LEFT BRACHIAL   Final   Drawn by 02/15/2023 [address]   Final   Allens test (pass/fail) 02/15/2023 PASS  PASS Final   Performed at [facility], [address] [place], [place] [address]   Sodium 02/17/2023 140  135 - 145 mmol/L Final   Potassium  02/17/2023 3.4 (L)  3.5 - 5.1 mmol/L Final   Chloride 02/17/2023 111  98 - 111 mmol/L Final   CO2 02/17/2023 21 (L)  22 - 32 mmol/L Final   Glucose, Bld 02/17/2023 95  70 - 99 mg/dL Final   Glucose reference range applies only to samples taken after fasting for at least 8 hours.   BUN 02/17/2023 6  6 - 20 mg/dL Final   Creatinine, Ser 02/17/2023 0.73  0.61 - 1.24 mg/dL Final   Calcium 09/81/1914 8.3 (L)  8.9 - 10.3 mg/dL Final   Total Protein 78/29/5621 7.2  6.5 - 8.1 g/dL Final   Albumin 30/86/5784 2.7 (L)  3.5 - 5.0 g/dL Final   AST 69/62/9528 76 (H)  15 - 41 U/L Final   ALT 02/17/2023 52 (H)  0 - 44 U/L Final   Alkaline Phosphatase 02/17/2023 84  38 - 126 U/L Final   Total Bilirubin 02/17/2023 0.3  0.3 - 1.2 mg/dL Final   GFR, Estimated 02/17/2023 >60  >60 mL/min Final   Comment: (NOTE) Calculated using the CKD-EPI Creatinine Equation (2021)    Anion gap 02/17/2023 8  5 - 15 Final   Performed at [facility], [address] [place], [place] [address]   WBC 02/17/2023 13.4 (H)  4.0 - 10.5 K/uL Final   RBC 02/17/2023 5.08  4.22 - 5.81 MIL/uL Final   Hemoglobin 02/17/2023 13.9  13.0 - 17.0 g/dL Final   HCT 40/04/2724 44.0  39.0 - 52.0 % Final   MCV 02/17/2023 86.6  80.0 - 100.0 fL Final   MCH 02/17/2023 27.4  26.0 - 34.0 pg Final   MCHC 02/17/2023 31.6  30.0 - 36.0 g/dL Final   RDW 36/64/4034 14.4  11.5 - 15.5 % Final   Platelets 02/17/2023 422 (H)  150 - 400 K/uL Final   nRBC 02/17/2023 0.0  0.0 - 0.2 % Final   Performed at [facility], [address] [place], [place] [address]   Sodium 02/18/2023 135  135 - 145 mmol/L Final   Potassium 02/18/2023 3.8  3.5 - 5.1 mmol/L Final   Chloride 02/18/2023 101  98 - 111 mmol/L Final   CO2 02/18/2023 24  22 - 32 mmol/L Final   Glucose, Bld 02/18/2023 114 (H)  70 - 99 mg/dL Final   Glucose reference range applies only to samples taken after fasting for at least 8 hours.   BUN 02/18/2023 6  6 - 20  mg/dL Final   Creatinine, Ser 02/18/2023 0.74  0.61 - 1.24 mg/dL Final   Calcium 56/38/7564 8.4 (L)  8.9 - 10.3 mg/dL Final   Total Protein 33/29/5188 7.8  6.5 - 8.1 g/dL Final  Albumin 02/18/2023 2.6 (L)  3.5 - 5.0 g/dL Final   AST 04/54/0981 78 (H)  15 - 41 U/L Final   ALT 02/18/2023 55 (H)  0 - 44 U/L Final   Alkaline Phosphatase 02/18/2023 87  38 - 126 U/L Final   Total Bilirubin 02/18/2023 0.6  0.3 - 1.2 mg/dL Final   GFR, Estimated 02/18/2023 >60  >60 mL/min Final   Comment: (NOTE) Calculated using the CKD-EPI Creatinine Equation (2021)    Anion gap 02/18/2023 10  5 - 15 Final   Performed at [facility], [address], [place], [place] [address]   WBC 02/18/2023 17.6 (H)  4.0 - 10.5 K/uL Final   RBC 02/18/2023 4.74  4.22 - 5.81 MIL/uL Final   Hemoglobin 02/18/2023 13.0  13.0 - 17.0 g/dL Final   HCT 82/95/6213 39.6  39.0 - 52.0 % Final   MCV 02/18/2023 83.5  80.0 - 100.0 fL Final   MCH 02/18/2023 27.4  26.0 - 34.0 pg Final   MCHC 02/18/2023 32.8  30.0 - 36.0 g/dL Final   RDW 08/65/7846 14.2  11.5 - 15.5 % Final   Platelets 02/18/2023 464 (H)  150 - 400 K/uL Final   nRBC 02/18/2023 0.0  0.0 - 0.2 % Final   Performed at [facility], [address] [place], [place] [address]    Allergies: Carisoprodol  Medications:  Facility Ordered Medications  Medication   acetaminophen (TYLENOL) tablet 650 mg   alum & mag hydroxide-simeth (MAALOX/MYLANTA) 200-200-20 MG/5ML suspension 30 mL   magnesium hydroxide (MILK OF MAGNESIA) suspension 30 mL   OLANZapine zydis (ZYPREXA) disintegrating tablet 5 mg   And   ziprasidone (GEODON) injection 20 mg   PTA Medications  Medication Sig   baclofen (LIORESAL) 10 MG tablet Take 10 mg by mouth 3 (three) times daily.   sulfamethoxazole-trimethoprim (BACTRIM DS) 800-160 MG tablet Take 1 tablet by mouth every 12 (twelve) hours for 28 days.   ARIPiprazole (ABILIFY) 5 MG tablet Take 1 tablet (5 mg total) by  mouth daily.   DULoxetine (CYMBALTA) 30 MG capsule Take 1 capsule (30 mg total) by mouth 2 (two) times daily.   diclofenac Sodium (VOLTAREN) 1 % GEL Apply 2 grams topically 4 (four) times daily.      Medical Decision Making  Will send out to [facility] for higher level of care Recommendations  Sending to [facility] for higher level of care,    Sindy Guadeloupe, NP 06/21/23  8:35 PM

## 2023-06-22 ENCOUNTER — Encounter (HOSPITAL_COMMUNITY): Payer: Self-pay | Admitting: Adult Health

## 2023-06-22 ENCOUNTER — Inpatient Hospital Stay (HOSPITAL_COMMUNITY)
Admission: AD | Admit: 2023-06-22 | Discharge: 2023-06-30 | DRG: 896 | Disposition: A | Discharge status: Home / Self Care | Payer: Medicare HMO | Source: Intra-hospital | Attending: Psychiatry | Admitting: Psychiatry

## 2023-06-22 DIAGNOSIS — E559 Vitamin D deficiency, unspecified: Secondary | ICD-10-CM | POA: Diagnosis present

## 2023-06-22 DIAGNOSIS — F1910 Other psychoactive substance abuse, uncomplicated: Secondary | ICD-10-CM | POA: Diagnosis present

## 2023-06-22 DIAGNOSIS — Z9151 Personal history of suicidal behavior: Secondary | ICD-10-CM

## 2023-06-22 DIAGNOSIS — Z5941 Food insecurity: Secondary | ICD-10-CM | POA: Diagnosis not present

## 2023-06-22 DIAGNOSIS — F1424 Cocaine dependence with cocaine-induced mood disorder: Principal | ICD-10-CM | POA: Diagnosis present

## 2023-06-22 DIAGNOSIS — Z79891 Long term (current) use of opiate analgesic: Secondary | ICD-10-CM

## 2023-06-22 DIAGNOSIS — Z5900 Homelessness unspecified: Secondary | ICD-10-CM | POA: Diagnosis not present

## 2023-06-22 DIAGNOSIS — F419 Anxiety disorder, unspecified: Secondary | ICD-10-CM | POA: Diagnosis present

## 2023-06-22 DIAGNOSIS — Z23 Encounter for immunization: Secondary | ICD-10-CM

## 2023-06-22 DIAGNOSIS — R519 Headache, unspecified: Secondary | ICD-10-CM | POA: Diagnosis not present

## 2023-06-22 DIAGNOSIS — R45851 Suicidal ideations: Secondary | ICD-10-CM | POA: Diagnosis present

## 2023-06-22 DIAGNOSIS — Z5982 Transportation insecurity: Secondary | ICD-10-CM

## 2023-06-22 DIAGNOSIS — F514 Sleep terrors [night terrors]: Secondary | ICD-10-CM | POA: Diagnosis present

## 2023-06-22 DIAGNOSIS — S14105S Unspecified injury at C5 level of cervical spinal cord, sequela: Secondary | ICD-10-CM

## 2023-06-22 DIAGNOSIS — F14251 Cocaine dependence with cocaine-induced psychotic disorder with hallucinations: Secondary | ICD-10-CM | POA: Diagnosis present

## 2023-06-22 DIAGNOSIS — F1494 Cocaine use, unspecified with cocaine-induced mood disorder: Secondary | ICD-10-CM | POA: Insufficient documentation

## 2023-06-22 DIAGNOSIS — G8254 Quadriplegia, C5-C7 incomplete: Secondary | ICD-10-CM | POA: Diagnosis present

## 2023-06-22 DIAGNOSIS — F332 Major depressive disorder, recurrent severe without psychotic features: Secondary | ICD-10-CM | POA: Diagnosis present

## 2023-06-22 DIAGNOSIS — Z9081 Acquired absence of spleen: Secondary | ICD-10-CM | POA: Diagnosis not present

## 2023-06-22 DIAGNOSIS — F333 Major depressive disorder, recurrent, severe with psychotic symptoms: Secondary | ICD-10-CM | POA: Diagnosis not present

## 2023-06-22 DIAGNOSIS — M549 Dorsalgia, unspecified: Secondary | ICD-10-CM | POA: Diagnosis present

## 2023-06-22 DIAGNOSIS — Z79899 Other long term (current) drug therapy: Secondary | ICD-10-CM | POA: Diagnosis not present

## 2023-06-22 DIAGNOSIS — R442 Other hallucinations: Secondary | ICD-10-CM | POA: Diagnosis not present

## 2023-06-22 DIAGNOSIS — Z9079 Acquired absence of other genital organ(s): Secondary | ICD-10-CM | POA: Diagnosis not present

## 2023-06-22 LAB — RAPID URINE DRUG SCREEN, HOSP PERFORMED
Amphetamines: NOT DETECTED
Barbiturates: NOT DETECTED
Benzodiazepines: NOT DETECTED
Cocaine: POSITIVE — AB
Opiates: NOT DETECTED
Tetrahydrocannabinol: POSITIVE — AB

## 2023-06-22 MED ORDER — ACETAMINOPHEN 325 MG PO TABS
650.0000 mg | ORAL_TABLET | Freq: Once | ORAL | Status: AC
  Administered 2023-06-22: 650 mg via ORAL
  Filled 2023-06-22: qty 2

## 2023-06-22 MED ORDER — ACETAMINOPHEN 325 MG PO TABS
650.0000 mg | ORAL_TABLET | Freq: Four times a day (QID) | ORAL | Status: DC | PRN
  Administered 2023-06-22 – 2023-06-29 (×9): 650 mg via ORAL
  Filled 2023-06-22 (×9): qty 2

## 2023-06-22 MED ORDER — DULOXETINE HCL 30 MG PO CPEP
30.0000 mg | ORAL_CAPSULE | Freq: Two times a day (BID) | ORAL | Status: DC
  Administered 2023-06-22 – 2023-06-25 (×6): 30 mg via ORAL
  Filled 2023-06-22 (×9): qty 1

## 2023-06-22 MED ORDER — DIPHENHYDRAMINE HCL 50 MG/ML IJ SOLN: 50.0000 mg | Freq: Three times a day (TID) | INTRAMUSCULAR | Status: DC | PRN

## 2023-06-22 MED ORDER — OXYCODONE-ACETAMINOPHEN 5-325 MG PO TABS
1.0000 | ORAL_TABLET | Freq: Once | ORAL | Status: AC
  Administered 2023-06-22: 1 via ORAL
  Filled 2023-06-22: qty 1

## 2023-06-22 MED ORDER — ALUM & MAG HYDROXIDE-SIMETH 200-200-20 MG/5ML PO SUSP: 30.0000 mL | ORAL | Status: DC | PRN

## 2023-06-22 MED ORDER — ACETAMINOPHEN 325 MG PO TABS
650.0000 mg | ORAL_TABLET | Freq: Once | ORAL | Status: AC
  Administered 2023-06-22: 650 mg via ORAL

## 2023-06-22 MED ORDER — HYDROXYZINE HCL 25 MG PO TABS
25.0000 mg | ORAL_TABLET | ORAL | Status: AC
  Administered 2023-06-22: 25 mg via ORAL
  Filled 2023-06-22 (×2): qty 1

## 2023-06-22 MED ORDER — MAGNESIUM HYDROXIDE 400 MG/5ML PO SUSP: 30.0000 mL | Freq: Every day | ORAL | Status: DC | PRN

## 2023-06-22 MED ORDER — LORAZEPAM 2 MG/ML IJ SOLN: 2.0000 mg | Freq: Three times a day (TID) | INTRAMUSCULAR | Status: DC | PRN

## 2023-06-22 MED ORDER — ARIPIPRAZOLE 5 MG PO TABS
5.0000 mg | ORAL_TABLET | Freq: Every day | ORAL | Status: DC
  Administered 2023-06-22 – 2023-06-23 (×2): 5 mg via ORAL
  Filled 2023-06-22 (×5): qty 1

## 2023-06-22 MED ORDER — HALOPERIDOL LACTATE 5 MG/ML IJ SOLN: 5.0000 mg | Freq: Three times a day (TID) | INTRAMUSCULAR | Status: DC | PRN

## 2023-06-22 NOTE — Group Note (Signed)
Date:  06/22/2023 Time:  9:03 PM  Group Topic/Focus:  Wrap-Up Group:   The focus of this group is to help patients review their daily goal of treatment and discuss progress on daily workbooks.    Participation Level:  Did Not Attend  Participation Quality:   N/A  Affect:   N/A  Cognitive:   N/A  Insight: None  Engagement in Group:   N/A  Modes of Intervention:   N/A  Additional Comments:  Patient did not attend wrap up group.   [name] 06/22/2023, 9:03 PM

## 2023-06-22 NOTE — Plan of Care (Signed)
  Problem: Safety: Goal: Periods of time without injury will increase Outcome: Progressing   Problem: Education: Goal: Verbalization of understanding the information provided will improve Outcome: Progressing

## 2023-06-22 NOTE — ED Notes (Signed)
Pt was wanded by security.  

## 2023-06-22 NOTE — Progress Notes (Signed)
Pt has been accepted to [facility] on 06/22/2023 Bed assignment: 403-1  Pt meets inpatient criteria per: Ophelia Shoulder NP  Attending Physician will be: Dr. Sarita Bottom, MD   Report can be called to: Adult unit: [id number]  Pt can arrive after Pre-admit/ DC  Care Team Notified: [facility]  Rona Ravens RN, Roseanne Reno RN, Ophelia Shoulder NP,    [place] Taniyah Ballow LCSW-A   06/22/2023 10:34 AM

## 2023-06-22 NOTE — Progress Notes (Signed)
LCSW Progress Note  [id number]   Mark Porter  06/22/2023  10:10 AM  Description:   Inpatient Psychiatric Referral  Patient was recommended inpatient per: Sindy Guadeloupe NP There are no available beds at [facility], per [facility] Rona Ravens RN. Patient was referred to the following out of network facilities:   Destination  Service Provider Address Phone Fax  [facility] Patient Placement  [facility], [place] [phone]  [facility]  [address] [place] [address] [phone]  [facility] [address]  [address] [place] [address] [phone]  [facility]  [address] [place], [place] [address] [phone]  [facility]  [address], [place] [address] [phone]  [facility]  [address] [place] [address] [phone]  [facility]  [address] [place], [place] [address] [phone]  [facility]  [address], [place] [address] [phone]  [facility]  [address] [place] [address] [phone]  [facility]  [address] [place] [address] [phone]  [facility]  [address], [place] [address] [phone]  [facility]  [address] [place] [address] [phone]  [facility]  [place] [phone]  [facility] EFAX  [address] [place], [place] [phone]  [facility]  [address] [place] [address] [phone]  [facility]  [address] [place],  [place] [address] [phone]  [facility]  [address], [place] [address] [phone]  [facility] EFAX  [place] [phone]      Situation ongoing, CSW to continue following and update chart as more information becomes available.      [place] Yanisa Goodgame, MSW, LCSW  06/22/2023 10:10 AM

## 2023-06-22 NOTE — ED Provider Notes (Signed)
Emergency Medicine Observation Re-evaluation Note  Mark Porter is a 57 y.o. male, seen on rounds today.  Pt initially presented to the ED for complaints of Medical Clearance Currently, the patient is resting. No specific complaints   Physical Exam  BP (!) 122/92   Pulse 78   Temp 98 F (36.7 C) (Oral)   Resp (!) 22   SpO2 95%  Physical Exam General: Well appearing  Cardiac: normal rate  Lungs: Normal WOB  Psych: Calm andcooperative   ED Course / MDM  EKG:EKG Interpretation Date/Time:  Tuesday June 21 2023 22:35:07 EST Ventricular Rate:  89 PR Interval:  137 QRS Duration:  100 QT Interval:  377 QTC Calculation: 459 R Axis:   171  Text Interpretation: Sinus rhythm LAE, consider biatrial enlargement Low voltage, precordial leads Consider right ventricular hypertrophy Confirmed by Nicanor Alcon, Mark Porter ([id number]) on 06/22/2023 8:55:27 AM  I have reviewed the labs performed to date as well as medications administered while in observation.  Recent changes in the last 24 hours include; accepted to [facility].   Plan  Current plan is for inpatient psych; awaiting bed assignment at [facility].    Coral Spikes, DO 06/22/23 1315

## 2023-06-22 NOTE — Progress Notes (Signed)
Pt stated when he came to [facility] he did not have his book bag ( and there is no documentation that the book bag was at [facility]), per ED notes it was documented at [facility] , they were notified and stated they would check on it and get back to [place].

## 2023-06-22 NOTE — Progress Notes (Signed)
   06/22/23 2100  Psych Admission Type (Psych Patients Only)  Admission Status Voluntary  Psychosocial Assessment  Patient Complaints Depression  Eye Contact Fair  Facial Expression Sad  Affect Sad  Speech Soft  Interaction Cautious;Assertive  Motor Activity Other (Comment) (wheelchair)  Appearance/Hygiene In scrubs  Behavior Characteristics Cooperative  Mood Sad  Aggressive Behavior  Effect No apparent injury  Thought Process  Coherency WDL  Content WDL  Delusions WDL  Perception Hallucinations  Hallucination Auditory;Visual  Judgment Impaired  Confusion WDL  Danger to Self  Current suicidal ideation? Passive  Agreement Not to Harm Self Yes  Description of Agreement verbal contract for safety  Danger to Others  Danger to Others None reported or observed
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
## 2023-06-22 NOTE — Plan of Care (Signed)
°  Problem: Education: °Goal: Emotional status will improve °Outcome: Progressing °Goal: Mental status will improve °Outcome: Progressing °  °Problem: Activity: °Goal: Interest or engagement in activities will improve °Outcome: Progressing °  °

## 2023-06-22 NOTE — ED Notes (Signed)
Pt has been sleeping and appears in no distress.  Pt woke up and is asking for additional coke and something for pain.  Both given to pt, see MAR

## 2023-06-23 DIAGNOSIS — F333 Major depressive disorder, recurrent, severe with psychotic symptoms: Secondary | ICD-10-CM | POA: Diagnosis not present

## 2023-06-23 DIAGNOSIS — F332 Major depressive disorder, recurrent severe without psychotic features: Principal | ICD-10-CM | POA: Insufficient documentation

## 2023-06-23 DIAGNOSIS — F14251 Cocaine dependence with cocaine-induced psychotic disorder with hallucinations: Secondary | ICD-10-CM | POA: Insufficient documentation

## 2023-06-23 LAB — COMPREHENSIVE METABOLIC PANEL
ALT: 72 U/L — ABNORMAL HIGH (ref 0–44)
AST: 64 U/L — ABNORMAL HIGH (ref 15–41)
Albumin: 3.5 g/dL (ref 3.5–5.0)
Alkaline Phosphatase: 122 U/L (ref 38–126)
Anion gap: 9 (ref 5–15)
BUN: 13 mg/dL (ref 6–20)
CO2: 20 mmol/L — ABNORMAL LOW (ref 22–32)
Calcium: 8.7 mg/dL — ABNORMAL LOW (ref 8.9–10.3)
Chloride: 106 mmol/L (ref 98–111)
Creatinine, Ser: 0.94 mg/dL (ref 0.61–1.24)
GFR, Estimated: >60 mL/min (ref >60)
Glucose, Bld: 131 mg/dL — ABNORMAL HIGH (ref 70–99)
Potassium: 3.6 mmol/L (ref 3.5–5.1)
Sodium: 135 mmol/L (ref 135–145)
Total Bilirubin: 0.4 mg/dL (ref <1.2)
Total Protein: 7.8 g/dL (ref 6.5–8.1)

## 2023-06-23 LAB — LIPID PANEL
Cholesterol: 156 mg/dL (ref 0–200)
HDL: 43 mg/dL (ref >40)
LDL Cholesterol: 88 mg/dL (ref 0–99)
Total CHOL/HDL Ratio: 3.6 {ratio}
Triglycerides: 124 mg/dL (ref <150)
VLDL: 25 mg/dL (ref 0–40)

## 2023-06-23 LAB — TSH: TSH: 1.092 u[IU]/mL (ref 0.350–4.500)

## 2023-06-23 LAB — HEMOGLOBIN A1C
Hgb A1c MFr Bld: 5.5 % (ref 4.8–5.6)
Mean Plasma Glucose: 111.15 mg/dL

## 2023-06-23 LAB — VITAMIN D 25 HYDROXY (VIT D DEFICIENCY, FRACTURES): Vit D, 25-Hydroxy: 22.01 ng/mL — ABNORMAL LOW (ref 30–100)

## 2023-06-23 LAB — FOLATE: Folate: 9.9 ng/mL (ref >5.9)

## 2023-06-23 LAB — VITAMIN B12: Vitamin B-12: 235 pg/mL (ref 180–914)

## 2023-06-23 MED ORDER — ARIPIPRAZOLE 10 MG PO TABS
10.0000 mg | ORAL_TABLET | Freq: Every day | ORAL | Status: DC
  Administered 2023-06-24 – 2023-06-30 (×7): 10 mg via ORAL
  Filled 2023-06-23 (×9): qty 1

## 2023-06-23 MED ORDER — INFLUENZA VIRUS VACC SPLIT PF (FLUZONE) 0.5 ML IM SUSY
0.5000 mL | PREFILLED_SYRINGE | INTRAMUSCULAR | Status: AC
  Administered 2023-06-24: 0.5 mL via INTRAMUSCULAR
  Filled 2023-06-23: qty 0.5

## 2023-06-23 MED ORDER — TRAZODONE HCL 50 MG PO TABS
50.0000 mg | ORAL_TABLET | Freq: Every evening | ORAL | Status: DC | PRN
  Administered 2023-06-23 – 2023-06-29 (×5): 50 mg via ORAL
  Filled 2023-06-23 (×6): qty 1

## 2023-06-23 NOTE — Psychosocial Assessment (Signed)
.  PSA 1st attempt  CSW attempted to complete PSA, Consents and ROI 12.12.24 @1053 .  CSW explained her role, pt declines at this time to speak with a Child psychotherapist, would like to complete in a few days. CSW made a note on [facility] handoff for CSW to followup with pt.   Steffanie Dunn LCSWA 1052 12.12.24

## 2023-06-23 NOTE — Progress Notes (Signed)
   06/23/23 2000  Psych Admission Type (Psych Patients Only)  Admission Status Voluntary  Psychosocial Assessment  Patient Complaints Anxiety;Depression  Eye Contact Fair  Facial Expression Sad  Affect Sad  Speech Soft  Interaction Cautious;Assertive  Motor Activity Other (Comment) (wheelchair)  Appearance/Hygiene In scrubs  Behavior Characteristics Cooperative  Mood Depressed;Anxious  Aggressive Behavior  Effect No apparent injury  Thought Process  Coherency WDL  Content WDL  Delusions WDL  Perception Hallucinations  Hallucination Auditory;Visual  Judgment Impaired  Confusion WDL  Danger to Self  Current suicidal ideation? Passive  Agreement Not to Harm Self Yes  Description of Agreement verbal contract for safety  Danger to Others  Danger to Others None reported or observed

## 2023-06-23 NOTE — Progress Notes (Signed)
   06/23/23 0925  Psych Admission Type (Psych Patients Only)  Admission Status Voluntary  Psychosocial Assessment  Patient Complaints Anxiety;Depression  Eye Contact Brief  Facial Expression Flat;Sad  Affect Anxious;Depressed  Speech Soft  Interaction Assertive  Motor Activity Slow  Appearance/Hygiene In scrubs  Behavior Characteristics Cooperative;Appropriate to situation  Mood Depressed;Anxious  Aggressive Behavior  Effect No apparent injury  Thought Process  Coherency WDL  Content WDL  Delusions None reported or observed  Perception Hallucinations  Hallucination Auditory (Pt reports AH" bossing me around")  Judgment Impaired  Confusion WDL  Danger to Self  Current suicidal ideation? Passive  Agreement Not to Harm Self Yes  Description of Agreement Verbal  Danger to Others  Danger to Others None reported or observed

## 2023-06-23 NOTE — Group Note (Signed)
LCSW Group Therapy Note  Group Date: 06/23/2023 Start Time: 1100 End Time: 1200   Type of Therapy and Topic:  Group Therapy - Healthy vs Unhealthy Coping Skills  Participation Level:  Did Not Attend   Description of Group The focus of this group was to determine what unhealthy coping techniques typically are used by group members and what healthy coping techniques would be helpful in coping with various problems. Patients were guided in becoming aware of the differences between healthy and unhealthy coping techniques. Patients were asked to identify 2-3 healthy coping skills they would like to learn to use more effectively.  Therapeutic Goals Patients learned that coping is what human beings do all day long to deal with various situations in their lives Patients defined and discussed healthy vs unhealthy coping techniques Patients identified their preferred coping techniques and identified whether these were healthy or unhealthy Patients determined 2-3 healthy coping skills they would like to become more familiar with and use more often. Patients provided support and ideas to each other   Summary of Patient Progress:  Did not attend   Therapeutic Modalities Cognitive Behavioral Therapy Motivational Interviewing  Kathi Der, LCSWA 06/23/2023  3:06 PM
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
## 2023-06-23 NOTE — Plan of Care (Signed)
  Problem: Education: Goal: Emotional status will improve Outcome: Progressing Goal: Mental status will improve Outcome: Progressing   Problem: Activity: Goal: Interest or engagement in activities will improve Outcome: Progressing Goal: Sleeping patterns will improve Outcome: Progressing

## 2023-06-23 NOTE — Plan of Care (Signed)
  Problem: Education: Goal: Emotional status will improve Outcome: Progressing   Problem: Activity: Goal: Sleeping patterns will improve Outcome: Progressing   Problem: Safety: Goal: Periods of time without injury will increase Outcome: Progressing   Problem: Coping: Goal: Ability to demonstrate self-control will improve Outcome: Progressing

## 2023-06-24 ENCOUNTER — Encounter (HOSPITAL_COMMUNITY): Payer: Self-pay | Admitting: Adult Health

## 2023-06-24 ENCOUNTER — Encounter (HOSPITAL_COMMUNITY): Payer: Self-pay

## 2023-06-24 DIAGNOSIS — E559 Vitamin D deficiency, unspecified: Secondary | ICD-10-CM

## 2023-06-24 DIAGNOSIS — F333 Major depressive disorder, recurrent, severe with psychotic symptoms: Secondary | ICD-10-CM

## 2023-06-24 HISTORY — DX: Vitamin D deficiency, unspecified: E55.9

## 2023-06-24 LAB — RPR: RPR Ser Ql: NONREACTIVE

## 2023-06-24 LAB — GLUCOSE, CAPILLARY: Glucose-Capillary: 177 mg/dL — ABNORMAL HIGH (ref 70–99)

## 2023-06-24 MED ORDER — VITAMIN D3 25 MCG PO TABS
2000.0000 [IU] | ORAL_TABLET | Freq: Every day | ORAL | Status: DC
  Administered 2023-06-24 – 2023-06-30 (×7): 2000 [IU] via ORAL
  Filled 2023-06-24 (×11): qty 2

## 2023-06-24 NOTE — Plan of Care (Signed)
  Problem: Education: Goal: Knowledge of [facility] Education information/materials will improve Outcome: Not Progressing Goal: Emotional status will improve Outcome: Not Progressing Goal: Mental status will improve Outcome: Not Progressing Goal: Verbalization of understanding the information provided will improve Outcome: Not Progressing   Problem: Activity: Goal: Interest or engagement in activities will improve Outcome: Not Progressing Goal: Sleeping patterns will improve Outcome: Not Progressing   Problem: Physical Regulation: Goal: Ability to maintain clinical measurements within normal limits will improve Outcome: Progressing   Problem: Safety: Goal: Periods of time without injury will increase Outcome: Progressing

## 2023-06-24 NOTE — BH IP Treatment Plan (Signed)
Interdisciplinary Treatment and Diagnostic Plan Update  06/24/2023 Time of Session: 11:15 am Mark Porter MRN: [id number]  Principal Diagnosis: Major depressive disorder, recurrent episode, severe (HCC)  Secondary Diagnoses: Principal Problem:   Major depressive disorder, recurrent episode, severe (HCC) Active Problems:   Polysubstance abuse (HCC)   Suicidal ideation   Cocaine dependence with hallucinations (HCC)   Current Medications:  Current Facility-Administered Medications  Medication Dose Route Frequency Provider Last Rate Last Admin   acetaminophen (TYLENOL) tablet 650 mg  650 mg Oral Q6H PRN Chales Abrahams, NP   650 mg at 06/23/23 2105   alum & mag hydroxide-simeth (MAALOX/MYLANTA) 200-200-20 MG/5ML suspension 30 mL  30 mL Oral Q4H PRN Chales Abrahams, NP       ARIPiprazole (ABILIFY) tablet 10 mg  10 mg Oral Daily Bennett, Christal H, NP   10 mg at 06/24/23 0748   haloperidol lactate (HALDOL) injection 5 mg  5 mg Intramuscular TID PRN Chales Abrahams, NP       And   diphenhydrAMINE (BENADRYL) injection 50 mg  50 mg Intramuscular TID PRN Chales Abrahams, NP       And   LORazepam (ATIVAN) injection 2 mg  2 mg Intramuscular TID PRN Ophelia Shoulder E, NP       DULoxetine (CYMBALTA) DR capsule 30 mg  30 mg Oral BID Ophelia Shoulder E, NP   30 mg at 06/24/23 0748   magnesium hydroxide (MILK OF MAGNESIA) suspension 30 mL  30 mL Oral Daily PRN Chales Abrahams, NP       traZODone (DESYREL) tablet 50 mg  50 mg Oral QHS PRN Bennett, Christal H, NP   50 mg at 06/23/23 2105   vitamin D3 (CHOLECALCIFEROL) tablet 2,000 Units  2,000 Units Oral Daily Golda Acre, MD   2,000 Units at 06/24/23 1038   PTA Medications: Medications Prior to Admission  Medication Sig Dispense Refill Last Dose/Taking   ARIPiprazole (ABILIFY) 5 MG tablet Take 1 tablet (5 mg total) by mouth daily. (Patient not taking: Reported on 06/23/2023) 30 tablet 0 Not Taking   DULoxetine (CYMBALTA) 30 MG capsule Take 1  capsule (30 mg total) by mouth 2 (two) times daily. (Patient not taking: Reported on 06/23/2023) 60 capsule 0 Not Taking   oxyCODONE (OXY IR/ROXICODONE) 5 MG immediate release tablet Take 10 mg by mouth in the morning, at noon, and at bedtime. (Patient not taking: Reported on 06/23/2023)   Not Taking    Patient Stressors:    Patient Strengths:    Treatment Modalities: Medication Management, Group therapy, Case management,  1 to 1 session with clinician, Psychoeducation, Recreational therapy.   Physician Treatment Plan for Primary Diagnosis: Major depressive disorder, recurrent episode, severe (HCC) Long Term Goal(s): Improvement in symptoms so as ready for discharge   Short Term Goals: Ability to identify changes in lifestyle to reduce recurrence of condition will improve Ability to verbalize feelings will improve Ability to disclose and discuss suicidal ideas Ability to demonstrate self-control will improve Ability to identify and develop effective coping behaviors will improve Ability to maintain clinical measurements within normal limits will improve Compliance with prescribed medications will improve Ability to identify triggers associated with substance abuse/mental health issues will improve  Medication Management: Evaluate patient's response, side effects, and tolerance of medication regimen.  Therapeutic Interventions: 1 to 1 sessions, Unit Group sessions and Medication administration.  Evaluation of Outcomes: Not Progressing  Physician Treatment Plan for Secondary Diagnosis: Principal Problem:   Major depressive  disorder, recurrent episode, severe (HCC) Active Problems:   Polysubstance abuse (HCC)   Suicidal ideation   Cocaine dependence with hallucinations (HCC)  Long Term Goal(s): Improvement in symptoms so as ready for discharge   Short Term Goals: Ability to identify changes in lifestyle to reduce recurrence of condition will improve Ability to verbalize feelings  will improve Ability to disclose and discuss suicidal ideas Ability to demonstrate self-control will improve Ability to identify and develop effective coping behaviors will improve Ability to maintain clinical measurements within normal limits will improve Compliance with prescribed medications will improve Ability to identify triggers associated with substance abuse/mental health issues will improve     Medication Management: Evaluate patient's response, side effects, and tolerance of medication regimen.  Therapeutic Interventions: 1 to 1 sessions, Unit Group sessions and Medication administration.  Evaluation of Outcomes: Not Progressing   RN Treatment Plan for Primary Diagnosis: Major depressive disorder, recurrent episode, severe (HCC) Long Term Goal(s): Knowledge of disease and therapeutic regimen to maintain health will improve  Short Term Goals: Ability to remain free from injury will improve, Ability to verbalize frustration and anger appropriately will improve, Ability to demonstrate self-control, Ability to participate in decision making will improve, Ability to verbalize feelings will improve, Ability to disclose and discuss suicidal ideas, Ability to identify and develop effective coping behaviors will improve, and Compliance with prescribed medications will improve  Medication Management: RN will administer medications as ordered by provider, will assess and evaluate patient's response and provide education to patient for prescribed medication. RN will report any adverse and/or side effects to prescribing provider.  Therapeutic Interventions: 1 on 1 counseling sessions, Psychoeducation, Medication administration, Evaluate responses to treatment, Monitor vital signs and CBGs as ordered, Perform/monitor CIWA, COWS, AIMS and Fall Risk screenings as ordered, Perform wound care treatments as ordered.  Evaluation of Outcomes: Not Progressing   LCSW Treatment Plan for Primary  Diagnosis: Major depressive disorder, recurrent episode, severe (HCC) Long Term Goal(s): Safe transition to appropriate next level of care at discharge, Engage patient in therapeutic group addressing interpersonal concerns.  Short Term Goals: Engage patient in aftercare planning with referrals and resources, Increase social support, Increase ability to appropriately verbalize feelings, Increase emotional regulation, and Increase skills for wellness and recovery  Therapeutic Interventions: Assess for all discharge needs, 1 to 1 time with Social worker, Explore available resources and support systems, Assess for adequacy in community support network, Educate family and significant other(s) on suicide prevention, Complete Psychosocial Assessment, Interpersonal group therapy.  Evaluation of Outcomes: Not Progressing   Progress in Treatment: Attending groups: Yes. Participating in groups: Yes. Taking medication as prescribed: Yes. Toleration medication: Yes. Family/Significant other contact made: No, will contact:  pt has not consented to speak with anyone Patient understands diagnosis: Yes. Discussing patient identified problems/goals with staff: Yes. Medical problems stabilized or resolved: Yes. Denies suicidal/homicidal ideation: Yes. Issues/concerns per patient self-inventory: No. Other: none reported  New problem(s) identified: No, Describe:  none reported  New Short Term/Long Term Goal(s): medication stabilization, elimination of SI thoughts, development of comprehensive mental wellness plan.    Patient Goals:  " I would like to work not hearing or seeing things, I do not want to have suicidal thoughts and a stable living environment"  Discharge Plan or Barriers: Patient recently admitted. CSW will continue to follow and assess for appropriate referrals and possible discharge planning.    Reason for Continuation of Hospitalization: Anxiety Delusions  Depression Suicidal  ideation  Estimated Length of Stay: 5-7  days  Last 3 [place] Suicide Severity Risk Score: Flowsheet Row Admission (Current) [facility] from [facility]21/2023 in [facility] ED to Hosp-Admission (Discharged) from 05/25/2023 in [facility]  C-SSRS RISK CATEGORY High Risk High Risk No Risk       Last PHQ 2/9 Scores:     No data to display          Scribe for Treatment Team: Kathrynn Humble 06/24/2023 3:21 PM

## 2023-06-24 NOTE — Progress Notes (Signed)
Pt awake and alert. Visible in milieu, denies SI, HI or AVH. Pt attending groups and maintaining appropriate boundaries.

## 2023-06-24 NOTE — Group Note (Signed)
Recreation Therapy Group Note   Group Topic:Stress Management  Group Date: 06/24/2023 Start Time: 0947 End Time: 1009 Facilitators: Jenavieve Freda-McCall, LRT,CTRS Location: [address] Dayroom   Group Topic: Stress Management   Goal Area(s) Addresses:  Patient will actively participate in stress management techniques presented during session.  Patient will successfully identify benefit of practicing stress management post d/c.   Behavioral Response: Appropriate  Intervention: Relaxation exercise with ambient sound and script   Group Description: Guided Imagery. LRT provided education, instruction, and demonstration on practice of visualization via guided imagery. Patient was asked to participate in the technique introduced during session. LRT debriefed including topics of mindfulness, stress management and specific scenarios each patient could use these techniques. Patients were given suggestions of ways to access scripts post d/c and encouraged to explore Youtube and other apps available on smartphones, tablets, and computers.  Education: Stress Management, Discharge Planning.   Education Outcome: Acknowledges education   Affect/Mood: N/A   Participation Level: Did not attend    Clinical Observations/Individualized Feedback:     Plan: Continue to engage patient in RT group sessions 2-3x/week.   Rand Etchison-McCall, LRT,CTRS 06/24/2023 12:36 PM
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
## 2023-06-24 NOTE — [facility] Group Notes (Signed)
Adult Psychoeducational Group Note  Date:  06/24/2023 Time:  10:55 AM  Group Topic/Focus:  Goals Group:   The focus of this group is to help patients establish daily goals to achieve during treatment and discuss how the patient can incorporate goal setting into their daily lives to aide in recovery.  Participation Level:  Did Not Attend  Participation Quality:  na  Affect:  na  Cognitive:  na  Insight: na  Engagement in Group:  na  Modes of Intervention:  na  Additional Comments:  Pt ddi not attend group  Burnett Sheng 06/24/2023, 10:55 AM

## 2023-06-24 NOTE — Plan of Care (Signed)
  Problem: Education: Goal: Emotional status will improve Outcome: Progressing Goal: Mental status will improve Outcome: Progressing   Problem: Activity: Goal: Sleeping patterns will improve Outcome: Progressing   Problem: Safety: Goal: Periods of time without injury will increase Outcome: Progressing   

## 2023-06-25 DIAGNOSIS — F332 Major depressive disorder, recurrent severe without psychotic features: Secondary | ICD-10-CM | POA: Diagnosis not present

## 2023-06-25 MED ORDER — VENLAFAXINE HCL ER 37.5 MG PO CP24
37.5000 mg | ORAL_CAPSULE | Freq: Every day | ORAL | Status: DC
  Administered 2023-06-26 – 2023-06-27 (×2): 37.5 mg via ORAL
  Filled 2023-06-25 (×4): qty 1

## 2023-06-25 NOTE — Progress Notes (Signed)
   06/25/23 1300  Psych Admission Type (Psych Patients Only)  Admission Status Voluntary  Psychosocial Assessment  Patient Complaints Depression  Eye Contact Fair  Facial Expression Sad  Affect Sad  Speech Logical/coherent  Interaction Minimal  Motor Activity Slow  Appearance/Hygiene In scrubs;Disheveled  Behavior Characteristics Cooperative;Guarded  Mood Depressed  Aggressive Behavior  Effect No apparent injury  Thought Process  Coherency WDL  Content WDL  Delusions WDL  Perception Hallucinations  Hallucination None reported or observed  Judgment Impaired  Confusion WDL  Danger to Self  Current suicidal ideation? Passive  Agreement Not to Harm Self Yes  Description of Agreement agreed to contact staff beforea acting on harmful thoughts  Danger to Others  Danger to Others None reported or observed
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
## 2023-06-25 NOTE — BHH Group Notes (Signed)
Pt did not attend goals group. 
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
## 2023-06-25 NOTE — BHH Group Notes (Signed)
BHH Group Notes:  (Nursing)  Date:  06/25/2023  Time: 1400  Type of Therapy:  Psychoeducational Skills  Participation Level:  Did Not Attend    Shela Nevin 06/25/2023, 3:53 PM

## 2023-06-25 NOTE — Progress Notes (Signed)
   06/25/23 2300  Psych Admission Type (Psych Patients Only)  Admission Status Voluntary  Psychosocial Assessment  Patient Complaints Depression  Eye Contact Fair  Facial Expression Anxious  Affect Sad  Speech Logical/coherent  Interaction Minimal  Motor Activity Slow  Appearance/Hygiene Disheveled  Behavior Characteristics Cooperative  Mood Depressed  Thought Process  Coherency WDL  Content WDL  Delusions None reported or observed  Perception WDL  Hallucination None reported or observed  Judgment Poor  Confusion None  Danger to Self  Current suicidal ideation? Denies  Self-Injurious Behavior No self-injurious ideation or behavior indicators observed or expressed   Agreement Not to Harm Self Yes  Description of Agreement verbal  Danger to Others  Danger to Others None reported or observed

## 2023-06-25 NOTE — BHH Group Notes (Signed)
Pt did not attend in the group activity

## 2023-06-25 NOTE — Plan of Care (Signed)
  Problem: Education: Goal: Emotional status will improve Outcome: Not Progressing Goal: Mental status will improve Outcome: Not Progressing   Problem: Activity: Goal: Interest or engagement in activities will improve Outcome: Not Progressing   

## 2023-06-26 DIAGNOSIS — F332 Major depressive disorder, recurrent severe without psychotic features: Secondary | ICD-10-CM | POA: Diagnosis not present

## 2023-06-26 LAB — VITAMIN B1: Vitamin B1 (Thiamine): 124.1 nmol/L (ref 66.5–200.0)

## 2023-06-26 MED ORDER — CYCLOBENZAPRINE HCL 10 MG PO TABS
5.0000 mg | ORAL_TABLET | Freq: Three times a day (TID) | ORAL | Status: DC | PRN
  Administered 2023-06-26 – 2023-06-29 (×6): 5 mg via ORAL
  Filled 2023-06-26 (×6): qty 1

## 2023-06-26 MED ORDER — IBUPROFEN 400 MG PO TABS
400.0000 mg | ORAL_TABLET | Freq: Four times a day (QID) | ORAL | Status: DC | PRN
  Administered 2023-06-26 – 2023-06-27 (×4): 400 mg via ORAL
  Filled 2023-06-26 (×4): qty 1

## 2023-06-26 NOTE — Progress Notes (Signed)
Mark Porter did not attend wrap up group.
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
## 2023-06-26 NOTE — Progress Notes (Signed)
Assumed care at about 08:00.  Resting in bed, awake, reports chronic back pain, prn Tylenol given with am meds. No falls. He uses a wheelchair for ambulation,he has remained self isolative to his room all day. Reports chronic auditory and visual hallucinations, non command in nature.Denied HI/SI at the time of my assesment.

## 2023-06-26 NOTE — Group Note (Unsigned)
Date:  06/26/2023 Time:  9:36 PM  Group Topic/Focus:  Wrap-Up Group:   The focus of this group is to help patients review their daily goal of treatment and discuss progress on daily workbooks.     Participation Level:  {BHH PARTICIPATION UEAVW:09811}  Participation Quality:  {BHH PARTICIPATION QUALITY:22265}  Affect:  {BHH AFFECT:22266}  Cognitive:  {BHH COGNITIVE:22267}  Insight: {BHH Insight2:20797}  Engagement in Group:  {BHH ENGAGEMENT IN BJYNW:29562}  Modes of Intervention:  {BHH MODES OF INTERVENTION:22269}  Additional Comments:  ***  [name] 06/26/2023, 9:36 PM

## 2023-06-26 NOTE — BHH Group Notes (Signed)
Type of Therapy and Topic:  Group Therapy: Mindful vs Mind Full  Participation Level: Did Not Attend   Description of Group:   In this group, patients shared and discussed the importance of acknowledging the elements in their lives for when they are mindful and mind full and how this can positively impact their mood.  The group discussed how being mindful of their surroundings can benefit the decisions they make and how it can affect others. The group also discussed how being mind full can impact the way decisions are ineffective. An exercise was done as a group in which a list was made of mindfulness items in order to encourage participants to consider other potential positives in their lives.  Therapeutic Goals: Patients will identify one or more item for which they are mindfulness living through their day and who it can affect:  people, experiences, things, places, skills, and other. Patients will discuss how it is possible to apply a mindful mind to an event in their life. Patients will explore other possible items of a mindful vs mind full that they could remember.    Summary of Patient Progress:  NA.  Therapeutic Modalities:   Solution-Focused Therapy

## 2023-06-26 NOTE — BHH Group Notes (Signed)
Pt did not attend the group activity

## 2023-06-26 NOTE — Plan of Care (Signed)
  Problem: Education: Goal: Knowledge of [facility] Education information/materials will improve Outcome: Progressing Goal: Emotional status will improve Outcome: Progressing Goal: Mental status will improve Outcome: Progressing Goal: Verbalization of understanding the information provided will improve Outcome: Progressing   

## 2023-06-26 NOTE — BHH Group Notes (Signed)
Pt did not attend music therapy

## 2023-06-26 NOTE — BHH Group Notes (Signed)
B[facility] Group Notes:  (Nursing)  Date:  06/26/2023  Time:  1400  Type of Therapy:  Psychoeducational Skills  Participation Level:  Did Not Attend   Shela Nevin 06/26/2023, 4:47 PM

## 2023-06-26 NOTE — BHH Group Notes (Signed)
Pt did not attend goals group. 

## 2023-06-27 DIAGNOSIS — F332 Major depressive disorder, recurrent severe without psychotic features: Secondary | ICD-10-CM | POA: Diagnosis not present

## 2023-06-27 MED ORDER — VENLAFAXINE HCL ER 75 MG PO CP24
75.0000 mg | ORAL_CAPSULE | Freq: Every day | ORAL | Status: DC
  Administered 2023-06-28 – 2023-06-30 (×3): 75 mg via ORAL
  Filled 2023-06-27 (×5): qty 1

## 2023-06-27 NOTE — BHH Group Notes (Signed)
B[facility] Group Notes:  (Nursing/MHT/Case Management/Adjunct)  Date:  06/27/2023  Time:  9:08 PM  Type of Therapy:   Wrap-up group  Participation Level:  Active  Participation Quality:  Appropriate  Affect:  Appropriate  Cognitive:  Appropriate  Insight:  Appropriate  Engagement in Group:  Engaged  Modes of Intervention:  Education  Summary of Progress/Problems: Goal to be D/C. Rated day 3/10.  Noah Delaine 06/27/2023, 9:08 PM

## 2023-06-27 NOTE — Progress Notes (Signed)
   06/26/23 2300  Psych Admission Type (Psych Patients Only)  Admission Status Voluntary  Psychosocial Assessment  Patient Complaints Depression  Eye Contact Fair  Facial Expression Anxious  Affect Sad  Speech Logical/coherent  Interaction Minimal  Motor Activity Slow  Appearance/Hygiene Disheveled  Behavior Characteristics Cooperative  Mood Depressed  Thought Process  Coherency WDL  Content WDL  Delusions None reported or observed  Perception WDL  Hallucination None reported or observed  Judgment WDL  Confusion None  Danger to Self  Current suicidal ideation? Denies  Self-Injurious Behavior No self-injurious ideation or behavior indicators observed or expressed   Agreement Not to Harm Self Yes  Description of Agreement Verbal  Danger to Others  Danger to Others None reported or observed

## 2023-06-27 NOTE — Plan of Care (Signed)
  Problem: Education: Goal: Emotional status will improve Outcome: Progressing Goal: Mental status will improve Outcome: Progressing Goal: Verbalization of understanding the information provided will improve Outcome: Progressing   Problem: Activity: Goal: Interest or engagement in activities will improve Outcome: Progressing Goal: Sleeping patterns will improve Outcome: Progressing   Problem: Coping: Goal: Ability to verbalize frustrations and anger appropriately will improve Outcome: Progressing Goal: Ability to demonstrate self-control will improve Outcome: Progressing   Problem: Safety: Goal: Periods of time without injury will increase Outcome: Progressing
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
## 2023-06-27 NOTE — Group Note (Signed)
Date:  06/27/2023 Time:  9:33 AM  Group Topic/Focus:  Orientation/Goals Group: Pt did not attend group     Shiela Bruns D Lelend Heinecke 06/27/2023, 9:33 AM

## 2023-06-27 NOTE — Progress Notes (Addendum)
D. Pt has been calm and cooperative, but continues to isolate for much of the shift,. Pt does seem to be more 'active' later in the shift, observed going down to the dining room for dinner. Pt given prn ibuprofen and flexeril for complaints of chronic pain, rated 8/10.  Pt continues to endorse passive SI with no plan- contracts for safety. Pt reported AH have lessened, and were now 'muffled'.   A. . Pt given and educated on medications. Pt supported emotionally and encouraged to express concerns and ask questions.   R. Pt remains safe with 15 minute checks. Will continue POC.    06/27/23 0900  Psych Admission Type (Psych Patients Only)  Admission Status Voluntary  Psychosocial Assessment  Patient Complaints Depression  Eye Contact Fair  Facial Expression Sad  Affect Depressed  Speech Logical/coherent  Interaction Minimal  Motor Activity Slow  Appearance/Hygiene Disheveled  Behavior Characteristics Cooperative;Appropriate to situation  Mood Depressed  Thought Process  Coherency WDL  Content WDL  Delusions None reported or observed  Perception WDL  Hallucination None reported or observed  Judgment WDL  Confusion None  Danger to Self  Current suicidal ideation? Passive  Self-Injurious Behavior No self-injurious ideation or behavior indicators observed or expressed   Agreement Not to Harm Self Yes  Description of Agreement agreed to contact staff before acting on harmful thoughts  Danger to Others  Danger to Others None reported or observed

## 2023-06-27 NOTE — [facility] Group Notes (Signed)

## 2023-06-27 NOTE — Progress Notes (Signed)
Patient care was assumed at 1930. Patient seen in room lying in bed awake. Alert and orient, denies HI and VH. States today "was okay." Reports passive SI with no plan, also reports voices telling him "to hurt myself." PRN Trazodone given for sleep,Ibuprofen and Flexeril given for pain, was effective, see MAR. Patient has slept through the night, respirations regular, even, and unlabored.Noted patient able to reposition self through the night. No apparent signs of distress note. Will continue plan of care.

## 2023-06-27 NOTE — Plan of Care (Signed)
  Problem: Health Behavior/Discharge Planning: Goal: Compliance with treatment plan for underlying cause of condition will improve Outcome: Progressing   Problem: Safety: Goal: Periods of time without injury will increase Outcome: Progressing   

## 2023-06-27 NOTE — Group Note (Signed)
Recreation Therapy Group Note   Group Topic:Stress Management  Group Date: 06/27/2023 Start Time: 0931 End Time: 0951 Facilitators: Lakendra Helling-McCall, LRT,CTRS Location: [address] Dayroom   Group Topic: Stress Management  Goal Area(s) Addresses:  Patient will identify positive stress management techniques. Patient will identify benefits of using stress management post d/c.  Group Description: Meditation. LRT engaged with patients about the benefits of meditation. LRT then played a meditation for patients that focused on self renewal and building confidence in ones self.    Education:  Stress Management, Discharge Planning.   Education Outcome: Acknowledges Education   Affect/Mood: N/A   Participation Level: Did not attend    Clinical Observations/Individualized Feedback:     Plan: Continue to engage patient in RT group sessions 2-3x/week.   Weaver Tweed-McCall, LRT,CTRS 06/27/2023 1:20 PM

## 2023-06-28 DIAGNOSIS — F332 Major depressive disorder, recurrent severe without psychotic features: Secondary | ICD-10-CM | POA: Diagnosis not present

## 2023-06-28 MED ORDER — IBUPROFEN 600 MG PO TABS
600.0000 mg | ORAL_TABLET | Freq: Three times a day (TID) | ORAL | Status: DC
  Administered 2023-06-28 – 2023-06-30 (×6): 600 mg via ORAL
  Filled 2023-06-28 (×15): qty 1

## 2023-06-28 NOTE — Group Note (Signed)
Recreation Therapy Group Note   Group Topic:Animal Assisted Therapy   Group Date: 06/28/2023 Start Time: 0950 End Time: 1030 Facilitators: Mozell Hardacre-McCall, LRT,CTRS Location: [address] Dayroom   Animal-Assisted Activity (AAA) Program Checklist/Progress Notes Patient Eligibility Criteria Checklist & Daily Group note for Rec Tx Intervention  AAA/T Program Assumption of Risk Form signed by Patient/ or Parent Legal Guardian Yes  Patient understands his/her participation is voluntary Yes  Education: Charity fundraiser, Appropriate Animal Interaction   Education Outcome: Acknowledges education.    Affect/Mood: N/A   Participation Level: Did not attend    Clinical Observations/Individualized Feedback:     Plan: Continue to engage patient in RT group sessions 2-3x/week.   Akilah Cureton-McCall, LRT,CTRS 06/28/2023 1:58 PM

## 2023-06-28 NOTE — Progress Notes (Signed)
     06/28/2023       2:52 PM   Mark Porter   Type of Note: Substance Use Treatment  Spoke with patient this afternoon who is stating he is interested in going to residential treatment. Information faxed over to [facility] this afternoon [phone]).   Signed:  Ahmet Schank, LCSW-A 06/28/2023  2:52 PM

## 2023-06-28 NOTE — [facility] Suicide Risk Assessment (Signed)
[facility] INPATIENT:  Family/Significant Other Suicide Prevention Education  Suicide Prevention Education:  Patient Refusal for Family/Significant Other Suicide Prevention Education: The patient Mark Porter has refused to provide written consent for family/significant other to be provided Family/Significant Other Suicide Prevention Education during admission and/or prior to discharge.  Physician notified.  Kathi Der 06/28/2023, 3:52 PM

## 2023-06-28 NOTE — Care Management Important Message (Signed)
Patient informed of right to appeal discharge, provided phone number to [facility]. Patient expressed no interest in appealing discharge at this time. CSW will continue to monitor situation.

## 2023-06-28 NOTE — Group Note (Signed)
LCSW Group Therapy Note  Group Date: 06/28/2023 Start Time: 1100 End Time: 1200   Type of Therapy and Topic:  Group Therapy - Healthy vs Unhealthy Coping Skills  Participation Level:  Did Not Attend   Description of Group The focus of this group was to determine what unhealthy coping techniques typically are used by group members and what healthy coping techniques would be helpful in coping with various problems. Patients were guided in becoming aware of the differences between healthy and unhealthy coping techniques. Patients were asked to identify 2-3 healthy coping skills they would like to learn to use more effectively.  Therapeutic Goals Patients learned that coping is what human beings do all day long to deal with various situations in their lives Patients defined and discussed healthy vs unhealthy coping techniques Patients identified their preferred coping techniques and identified whether these were healthy or unhealthy Patients determined 2-3 healthy coping skills they would like to become more familiar with and use more often. Patients provided support and ideas to each other   Summary of Patient Progress:  Did not attend.   Therapeutic Modalities Cognitive Behavioral Therapy Motivational Interviewing  Marinda Elk, [place] 06/28/2023  1:38 PM

## 2023-06-28 NOTE — Plan of Care (Signed)
  Problem: Education: Goal: Emotional status will improve Outcome: Progressing Goal: Mental status will improve Outcome: Progressing   

## 2023-06-28 NOTE — Group Note (Unsigned)
Date:  06/29/2023 Time:  4:35 AM  Group Topic/Focus:  Wrap-Up Group:   The focus of this group is to help patients review their daily goal of treatment and discuss progress on daily workbooks.    Participation Level:  Did Not Attend  Participation Quality:   N/A  Affect:   N/A  Cognitive:   N/A  Insight: None  Engagement in Group:   N/A  Modes of Intervention:   N.A  Additional Comments:  Patient did not attend wrap up group.   [name] 06/29/2023, 4:35 AM

## 2023-06-28 NOTE — Progress Notes (Signed)
   06/27/23 2100  Psych Admission Type (Psych Patients Only)  Admission Status Voluntary  Psychosocial Assessment  Patient Complaints Depression  Eye Contact Fair  Facial Expression Anxious  Affect Sad  Speech Logical/coherent  Interaction Minimal  Motor Activity Slow  Appearance/Hygiene Disheveled  Behavior Characteristics Cooperative;Appropriate to situation  Mood Depressed  Thought Process  Coherency WDL  Content WDL  Delusions None reported or observed  Perception WDL  Hallucination None reported or observed  Judgment WDL  Confusion None  Danger to Self  Current suicidal ideation? Denies  Self-Injurious Behavior No self-injurious ideation or behavior indicators observed or expressed   Agreement Not to Harm Self Yes  Description of Agreement Verbal  Danger to Others  Danger to Others None reported or observed
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
## 2023-06-29 ENCOUNTER — Encounter (HOSPITAL_COMMUNITY): Payer: Self-pay

## 2023-06-29 DIAGNOSIS — F332 Major depressive disorder, recurrent severe without psychotic features: Secondary | ICD-10-CM | POA: Diagnosis not present

## 2023-06-29 NOTE — Group Note (Signed)
Date:  06/29/2023 Time:  10:59 AM  Group Topic/Focus:  Developing a Wellness Toolbox:   The focus of this group is to help patients develop a "wellness toolbox" with skills and strategies to promote recovery upon discharge. Goals Group:   The focus of this group is to help patients establish daily goals to achieve during treatment and discuss how the patient can incorporate goal setting into their daily lives to aide in recovery.    Participation Level:  Did Not Attend  Participation Quality:   n/a  Affect:   n/a  Cognitive:   n/a  Insight: None  Engagement in Group:   n/a  Modes of Intervention:   n/a  Additional Comments:  Pt did not attend Group  Elise Gladden E Tyjai Charbonnet 06/29/2023, 10:59 AM
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
## 2023-06-29 NOTE — Group Note (Signed)
Recreation Therapy Group Note   Group Topic:Other  Group Date: 06/29/2023 Start Time: 1400 End Time: 1435 Facilitators: Anetria Harwick-McCall, LRT,CTRS Location: [address] Dayroom   Activity Description/Intervention: Therapeutic Drumming. Patients with peers and staff were given the opportunity to engage in a leader facilitated [facility] Group Empowerment Drumming Circle with staff from the [employer], in partnership with [employer]. Teaching laboratory technician and trained [employer], Theodoro Doing leading with LRT observing and documenting intervention and pt response. This evidenced-based practice targets 7 areas of health and wellbeing in the human experience including: stress-reduction, exercise, self-expression, camaraderie/support, nurturing, spirituality, and music-making (leisure).   Goal Area(s) Addresses:  Patient will engage in pro-social way in music group.  Patient will follow directions of drum leader on the first prompt. Patient will demonstrate no behavioral issues during group.  Patient will identify if a reduction in stress level occurs as a result of participation in therapeutic drum circle.    Education: Leisure exposure, Pharmacologist, Musical expression, Discharge Planning   Affect/Mood: N/A   Participation Level: Did not attend    Clinical Observations/Individualized Feedback:      Plan: Continue to engage patient in RT group sessions 2-3x/week.   Sion Reinders-McCall, LRT,CTRS 06/29/2023 3:48 PM

## 2023-06-29 NOTE — Progress Notes (Addendum)
     06/29/2023       1:14 PM   Taden Silverio Lay   Type of Note: [employer] with admissions regarding patient going to facility at discharge. Admissions reports that they did not receive fax however this writer was able to provide information regarding patient to open file. CSW to refax information this afternoon [phone]). Pt's inquiry/referral number is [phone].  [facility] will call this writer back this afternoon to discuss admissions. Pt will then complete phone screening. Will continue to assist.  Signed:  Elpidio Thielen, LCSW-A 06/29/2023  1:14 PM

## 2023-06-29 NOTE — Plan of Care (Signed)
°  Problem: Education: Goal: Emotional status will improve Outcome: Progressing Goal: Mental status will improve Outcome: Progressing Goal: Verbalization of understanding the information provided will improve Outcome: Progressing   Problem: Activity: Goal: Interest or engagement in activities will improve Outcome: Progressing Goal: Sleeping patterns will improve Outcome: Progressing   Problem: Coping: Goal: Ability to demonstrate self-control will improve Outcome: Progressing   Problem: Safety: Goal: Periods of time without injury will increase Outcome: Progressing

## 2023-06-29 NOTE — [facility] Group Notes (Signed)

## 2023-06-29 NOTE — Progress Notes (Signed)
   06/29/23 0920  Psych Admission Type (Psych Patients Only)  Admission Status Voluntary  Psychosocial Assessment  Patient Complaints Depression;Anxiety  Eye Contact Fair  Facial Expression Anxious  Affect Anxious;Depressed  Speech Logical/coherent  Interaction Minimal  Motor Activity Slow  Appearance/Hygiene Disheveled  Behavior Characteristics Cooperative  Mood Depressed;Anxious  Thought Process  Coherency WDL  Content WDL  Delusions None reported or observed  Perception WDL  Hallucination None reported or observed  Judgment Impaired  Confusion None  Danger to Self  Current suicidal ideation? Denies  Agreement Not to Harm Self Yes  Description of Agreement Verbal  Danger to Others  Danger to Others None reported or observed

## 2023-06-29 NOTE — BH IP Treatment Plan (Addendum)
Interdisciplinary Treatment and Diagnostic Plan Update  06/29/2023 Time of Session: 11:20AM- UPDATE Mark Porter MRN: [id number]  Principal Diagnosis: Major depressive disorder, recurrent episode, severe (HCC)  Secondary Diagnoses: Principal Problem:   Major depressive disorder, recurrent episode, severe (HCC) Active Problems:   Polysubstance abuse (HCC)   Suicidal ideation   Cocaine dependence with hallucinations (HCC)   Current Medications:  Current Facility-Administered Medications  Medication Dose Route Frequency Provider Last Rate Last Admin   acetaminophen (TYLENOL) tablet 650 mg  650 mg Oral Q6H PRN Chales Abrahams, NP   650 mg at 06/28/23 2113   alum & mag hydroxide-simeth (MAALOX/MYLANTA) 200-200-20 MG/5ML suspension 30 mL  30 mL Oral Q4H PRN Chales Abrahams, NP       ARIPiprazole (ABILIFY) tablet 10 mg  10 mg Oral Daily Bennett, Christal H, NP   10 mg at 06/29/23 0820   cyclobenzaprine (FLEXERIL) tablet 5 mg  5 mg Oral Q8H PRN Lauro Franklin, MD   5 mg at 06/28/23 2113   haloperidol lactate (HALDOL) injection 5 mg  5 mg Intramuscular TID PRN Chales Abrahams, NP       And   diphenhydrAMINE (BENADRYL) injection 50 mg  50 mg Intramuscular TID PRN Chales Abrahams, NP       And   LORazepam (ATIVAN) injection 2 mg  2 mg Intramuscular TID PRN Ophelia Shoulder E, NP       ibuprofen (ADVIL) tablet 600 mg  600 mg Oral TID Cecilie Lowers, [name]   600 mg at 06/29/23 1242   magnesium hydroxide (MILK OF MAGNESIA) suspension 30 mL  30 mL Oral Daily PRN Chales Abrahams, NP       traZODone (DESYREL) tablet 50 mg  50 mg Oral QHS PRN Bennett, Christal H, NP   50 mg at 06/28/23 2113   venlafaxine XR (EFFEXOR-XR) 24 hr capsule 75 mg  75 mg Oral Q breakfast Ntuen, Jesusita Oka, [name]   75 mg at 06/29/23 0820   vitamin D3 (CHOLECALCIFEROL) tablet 2,000 Units  2,000 Units Oral Daily Golda Acre, MD   2,000 Units at 06/29/23 0820   PTA Medications: Medications Prior to Admission  Medication Sig  Dispense Refill Last Dose/Taking   ARIPiprazole (ABILIFY) 5 MG tablet Take 1 tablet (5 mg total) by mouth daily. (Patient not taking: Reported on 06/23/2023) 30 tablet 0 Not Taking   DULoxetine (CYMBALTA) 30 MG capsule Take 1 capsule (30 mg total) by mouth 2 (two) times daily. (Patient not taking: Reported on 06/23/2023) 60 capsule 0 Not Taking   oxyCODONE (OXY IR/ROXICODONE) 5 MG immediate release tablet Take 10 mg by mouth in the morning, at noon, and at bedtime. (Patient not taking: Reported on 06/23/2023)   Not Taking    Patient Stressors:    Patient Strengths:    Treatment Modalities: Medication Management, Group therapy, Case management,  1 to 1 session with clinician, Psychoeducation, Recreational therapy.   Physician Treatment Plan for Primary Diagnosis: Major depressive disorder, recurrent episode, severe (HCC) Long Term Goal(s): Improvement in symptoms so as ready for discharge   Short Term Goals: Ability to identify changes in lifestyle to reduce recurrence of condition will improve Ability to verbalize feelings will improve Ability to disclose and discuss suicidal ideas Ability to demonstrate self-control will improve Ability to identify and develop effective coping behaviors will improve Ability to maintain clinical measurements within normal limits will improve Compliance with prescribed medications will improve Ability to identify triggers associated with substance  abuse/mental health issues will improve  Medication Management: Evaluate patient's response, side effects, and tolerance of medication regimen.  Therapeutic Interventions: 1 to 1 sessions, Unit Group sessions and Medication administration.  Evaluation of Outcomes: Progressing  Physician Treatment Plan for Secondary Diagnosis: Principal Problem:   Major depressive disorder, recurrent episode, severe (HCC) Active Problems:   Polysubstance abuse (HCC)   Suicidal ideation   Cocaine dependence with  hallucinations (HCC)  Long Term Goal(s): Improvement in symptoms so as ready for discharge   Short Term Goals: Ability to identify changes in lifestyle to reduce recurrence of condition will improve Ability to verbalize feelings will improve Ability to disclose and discuss suicidal ideas Ability to demonstrate self-control will improve Ability to identify and develop effective coping behaviors will improve Ability to maintain clinical measurements within normal limits will improve Compliance with prescribed medications will improve Ability to identify triggers associated with substance abuse/mental health issues will improve     Medication Management: Evaluate patient's response, side effects, and tolerance of medication regimen.  Therapeutic Interventions: 1 to 1 sessions, Unit Group sessions and Medication administration.  Evaluation of Outcomes: Progressing   RN Treatment Plan for Primary Diagnosis: Major depressive disorder, recurrent episode, severe (HCC) Long Term Goal(s): Knowledge of disease and therapeutic regimen to maintain health will improve  Short Term Goals: Ability to remain free from injury will improve, Ability to verbalize frustration and anger appropriately will improve, Ability to participate in decision making will improve, Ability to disclose and discuss suicidal ideas, and Compliance with prescribed medications will improve  Medication Management: RN will administer medications as ordered by provider, will assess and evaluate patient's response and provide education to patient for prescribed medication. RN will report any adverse and/or side effects to prescribing provider.  Therapeutic Interventions: 1 on 1 counseling sessions, Psychoeducation, Medication administration, Evaluate responses to treatment, Monitor vital signs and CBGs as ordered, Perform/monitor CIWA, COWS, AIMS and Fall Risk screenings as ordered, Perform wound care treatments as  ordered.  Evaluation of Outcomes: Progressing   LCSW Treatment Plan for Primary Diagnosis: Major depressive disorder, recurrent episode, severe (HCC) Long Term Goal(s): Safe transition to appropriate next level of care at discharge, Engage patient in therapeutic group addressing interpersonal concerns.  Short Term Goals: Engage patient in aftercare planning with referrals and resources, Increase social support, Facilitate acceptance of mental health diagnosis and concerns, Facilitate patient progression through stages of change regarding substance use diagnoses and concerns, and Identify triggers associated with mental health/substance abuse issues  Therapeutic Interventions: Assess for all discharge needs, 1 to 1 time with Social worker, Explore available resources and support systems, Assess for adequacy in community support network, Educate family and significant other(s) on suicide prevention, Complete Psychosocial Assessment, Interpersonal group therapy.  Evaluation of Outcomes: Progressing   Progress in Treatment: Attending groups: Yes. Participating in groups: Yes. Taking medication as prescribed: Yes. Toleration medication: Yes. Family/Significant other contact made: No, will contact:  pt has not consented to speak with anyone Patient understands diagnosis: Yes. Discussing patient identified problems/goals with staff: Yes. Medical problems stabilized or resolved: Yes. Denies suicidal/homicidal ideation: Yes. Issues/concerns per patient self-inventory: No. Other: none reported   New problem(s) identified: No, Describe:  none reported   New Short Term/Long Term Goal(s): medication stabilization, elimination of SI thoughts, development of comprehensive mental wellness plan.      Patient Goals:  " I would like to work not hearing or seeing things, I do not want to have suicidal thoughts and a stable living  environment"   Discharge Plan or Barriers: Patient recently admitted.  CSW will continue to follow and assess for appropriate referrals and possible discharge planning.      Reason for Continuation of Hospitalization: Anxiety Delusions  Depression Suicidal ideation   Estimated Length of Stay: 2-3 days  Last 3 [place] Suicide Severity Risk Score: Flowsheet Row [facility] (Current[facility] from 06/21/2023 in [place] [facility] to Hosp-Admission (Discharged) from 05/25/2023 in [place] [facility]TH FLOOR PROGRESSIVE CARE AND UROLOGY  C-SSRS RISK CATEGORY High Risk High Risk No Risk       Last PHQ 2/9 Scores:     No data to display          Scribe for Treatment Team: Kathi Der, Theresia Majors 06/29/2023 12:48 PM

## 2023-06-29 NOTE — Plan of Care (Signed)
  Problem: Education: Goal: Mental status will improve Outcome: Progressing   Problem: Education: Goal: Emotional status will improve Outcome: Progressing   Problem: Activity: Goal: Sleeping patterns will improve Outcome: Progressing   Problem: Coping: Goal: Ability to verbalize frustrations and anger appropriately will improve Outcome: Progressing Goal: Ability to demonstrate self-control will improve Outcome: Progressing   Problem: Safety: Goal: Periods of time without injury will increase Outcome: Progressing

## 2023-06-29 NOTE — [facility] Group Notes (Signed)
Psychoeducational Group Note  Date:  06/29/2023 Time:  2000  Group Topic/Focus:  Wrap up group  Participation Level: Did Not Attend  Participation Quality:  Not Applicable  Affect:  Not Applicable  Cognitive:  Not Applicable  Insight:  Not Applicable  Engagement in Group: Not Applicable  Additional Comments:  Did not attend.  Marcille Buffy 06/29/2023, 9:30 PM

## 2023-06-29 NOTE — Plan of Care (Signed)
  Problem: Education: Goal: Knowledge of [facility] Education information/materials will improve Outcome: Progressing Goal: Emotional status will improve Outcome: Progressing Goal: Mental status will improve Outcome: Progressing Goal: Verbalization of understanding the information provided will improve Outcome: Progressing   

## 2023-06-30 ENCOUNTER — Other Ambulatory Visit (HOSPITAL_COMMUNITY): Payer: Self-pay

## 2023-06-30 DIAGNOSIS — F332 Major depressive disorder, recurrent severe without psychotic features: Secondary | ICD-10-CM | POA: Diagnosis not present

## 2023-06-30 MED ORDER — CYCLOBENZAPRINE HCL 5 MG PO TABS: 5.0000 mg | ORAL_TABLET | Freq: Three times a day (TID) | ORAL | 0 refills | Status: DC | PRN

## 2023-06-30 MED ORDER — TRAZODONE HCL 50 MG PO TABS: 50.0000 mg | ORAL_TABLET | Freq: Every evening | ORAL | 0 refills | Status: DC | PRN

## 2023-06-30 MED ORDER — ARIPIPRAZOLE 10 MG PO TABS: 10.0000 mg | ORAL_TABLET | Freq: Every day | ORAL | 0 refills | Status: DC

## 2023-06-30 MED ORDER — VITAMIN D3 25 MCG PO TABS
2000.0000 [IU] | ORAL_TABLET | Freq: Every day | ORAL | 0 refills | Status: DC
  Filled 2023-06-30: qty 100, 50d supply, fill #0

## 2023-06-30 MED ORDER — IBUPROFEN 600 MG PO TABS
600.0000 mg | ORAL_TABLET | Freq: Three times a day (TID) | ORAL | 0 refills | Status: DC
  Filled 2023-06-30: qty 30, 10d supply, fill #0

## 2023-06-30 MED ORDER — VENLAFAXINE HCL ER 75 MG PO CP24: 75.0000 mg | ORAL_CAPSULE | Freq: Every day | ORAL | 0 refills | Status: DC

## 2023-06-30 NOTE — Transportation (Signed)
06/30/2023  Mark Porter DOB: [birth year] MRN: [id number]   RIDER WAIVER AND RELEASE OF LIABILITY  For the purposes of helping with transportation needs, [facility] partners with outside transportation providers (taxi companies, [place], Catering manager.) to give [employer] patients or other approved people the choice of on-demand rides [employer]") to our buildings for non-emergency visits.  By using [employer], I, the person signing this document, on behalf of myself and/or any legal minors (in my care using the [employer]), agree:  Science writer given to me are supplied by independent, outside transportation providers who do not work for, or have any affiliation with, [employer]. [facility] is not a transportation company. [facility] has no control over the quality or safety of the rides I get using [employer]. [facility] has no control over whether any outside ride will happen on time or not. [facility] gives no guarantee on the reliability, quality, safety, or availability on any rides, or that no mistakes will happen. I know and accept that traveling by vehicle (car, truck, SVU, Zenaida Niece, bus, taxi, etc.) has risks of serious injuries such as disability, being paralyzed, and death. I know and agree the risk of using [employer] is mine alone, and not [employer]. Transport Services are provided "as is" and as are available. The transportation providers are in charge for all inspections and care of the vehicles used to provide these rides. I agree not to take legal action against [facility], its agents, employees, officers, directors, representatives, insurers, attorneys, assigns, successors, subsidiaries, and affiliates at any time for any reasons related directly or indirectly to using [employer]. I also agree not to take legal action against [facility] or its affiliates for any injury, death, or damage to property caused by or related to using  [employer]. I have read this Waiver and Release of Liability, and I understand the terms used in it and their legal meaning. This Waiver is freely and voluntarily given with the understanding that my right (or any legal minors) to legal action against [facility] relating to [employer] is knowingly given up to use these services.   I attest that I read the Ride Waiver and Release of Liability to Mark Porter, gave Mr. Afridi the opportunity to ask questions and answered the questions asked (if any). I affirm that Mark Porter then provided consent for assistance with transportation.
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
## 2023-06-30 NOTE — Progress Notes (Signed)
     06/30/2023       9:12 AM   Mark Porter   Type of Note: [employer] and spoke with Denny Peon in admissions this morning, pt's clinical information is still being reviewed. Explain that patient is discharging today, Denny Peon requests that pt gives them a phone call this afternoon or tomorrow regarding admission status. Information will be given to pt in discharge follow up.  Signed:  Iliza Blankenbeckler, LCSW-A 06/30/2023  9:12 AM

## 2023-06-30 NOTE — Group Note (Signed)
Date:  06/30/2023 Time:  11:22 AM  Group Topic/Focus:  Goals Group:   The focus of this group is to help patients establish daily goals to achieve during treatment and discuss how the patient can incorporate goal setting into their daily lives to aide in recovery. Orientation:   The focus of this group is to educate the patient on the purpose and policies of crisis stabilization and provide a format to answer questions about their admission.  The group details unit policies and expectations of patients while admitted.    Participation Level:  Did Not Attend   Arnoldo Hooker 06/30/2023, 11:22 AM

## 2023-06-30 NOTE — Plan of Care (Signed)
  Problem: Education: Goal: Knowledge of [facility] Education information/materials will improve Outcome: Progressing Goal: Emotional status will improve Outcome: Progressing   

## 2023-06-30 NOTE — [facility] Suicide Risk Assessment (Signed)
Suicide Risk Assessment  Discharge Assessment    [facility] Discharge Suicide Risk Assessment   Principal Problem: Major depressive disorder, recurrent episode, severe (HCC) Discharge Diagnoses: Principal Problem:   Major depressive disorder, recurrent episode, severe (HCC) Active Problems:   Polysubstance abuse (HCC)   Suicidal ideation   Cocaine dependence with hallucinations (HCC)  Reason for admission: Mark Porter is a 57 year old Caucasian male with history of polysubstance abuse, including opioids, and prior OD. The patient was brought to the [facility] by family where he reported he has been experiencing depression, auditory hallucinations, and SI with a plan to overdose on prescription medication, seeking safe housing instead. He was previously living with a friend, but is currently homeless.   Total Time spent with patient: 30 minutes  Musculoskeletal: Strength & Muscle Tone: within normal limits Gait & Station: normal Patient leans: N/A  Psychiatric Specialty Exam  Presentation  General Appearance:  Disheveled  Eye Contact: Fair  Speech: Clear and Coherent  Speech Volume: Normal  Handedness: Right  Mood and Affect  Mood: Anxious; Depressed; Dysphoric  Duration of Depression Symptoms: Greater than two weeks  Affect: Constricted  Thought Process  Thought Processes: Coherent  Descriptions of Associations:Intact  Orientation:Full (Time, Place and Person)  Thought Content:Logical  History of Schizophrenia/Schizoaffective disorder:No  Duration of Psychotic Symptoms:Greater than six months  Hallucinations:Hallucinations: None Description of Auditory Hallucinations: "Voices are slowing down" Description of Visual Hallucinations: Denies  Ideas of Reference:None  Suicidal Thoughts:Suicidal Thoughts: No SI Passive Intent and/or Plan: -- (Not applicable today)  Homicidal Thoughts:Homicidal Thoughts: No  Sensorium  Memory: Immediate Fair; Recent  Fair  Judgment: Fair  Insight: Fair  Art therapist  Concentration: Fair  Attention Span: Fair  Recall: [employer] of Knowledge: Fair  Language: Fair  Psychomotor Activity  Psychomotor Activity: Psychomotor Activity: Normal (Ambulating per wheelchair)  Assets  Assets: Resilience; Communication Skills  Sleep  Sleep: Sleep: Good Number of Hours of Sleep: 7.5  Physical Exam: Physical Exam Vitals and nursing note reviewed.  HENT:     Head: Normocephalic.     Nose: Nose normal.     Mouth/Throat:     Mouth: Mucous membranes are moist.  Eyes:     Extraocular Movements: Extraocular movements intact.  Cardiovascular:     Rate and Rhythm: Normal rate.     Pulses: Normal pulses.  Pulmonary:     Effort: Pulmonary effort is normal.  Abdominal:     Comments: Deferred  Genitourinary:    Comments: Deferred Musculoskeletal:        General: Normal range of motion.     Cervical back: Normal range of motion.  Skin:    General: Skin is warm.  Neurological:     General: No focal deficit present.     Mental Status: He is alert and oriented to person, place, and time.  Psychiatric:        Mood and Affect: Mood normal.        Behavior: Behavior normal.        Thought Content: Thought content normal.    Review of Systems  Constitutional:  Negative for chills and fever.  HENT:  Negative for sore throat.   Eyes:  Negative for blurred vision.  Respiratory:  Negative for cough, sputum production, shortness of breath and wheezing.   Cardiovascular:  Negative for chest pain and palpitations.  Gastrointestinal:  Negative for abdominal pain, constipation, diarrhea, heartburn, nausea and vomiting.  Genitourinary:  Negative for dysuria, frequency and urgency.  Musculoskeletal:  Negative for falls.  Skin:  Negative for itching and rash.  Neurological:  Negative for dizziness, tingling, tremors, sensory change and headaches.  Endo/Heme/Allergies:        See allergy  listing  Psychiatric/Behavioral:  Positive for depression (Stable with medication). The patient is nervous/anxious (Improved with medication).    Blood pressure 110/80, pulse 80, temperature (!) 97.4 F (36.3 C), temperature source Oral, resp. rate 18, height 5\' 7"  (1.702 m), weight 70.8 kg, SpO2 97%. Body mass index is 24.43 kg/m.  Mental Status Per Nursing Assessment::   On Admission:  Suicidal ideation indicated by patient  Demographic Factors:  Male, Caucasian, Low socioeconomic status, and Unemployed  Loss Factors: Decrease in vocational status and Financial problems/change in socioeconomic status  Historical Factors: Impulsivity  Risk Reduction Factors:   Positive social support, Positive therapeutic relationship, and Positive coping skills or problem solving skills  Continued Clinical Symptoms:  Depression:   Recent sense of peace/wellbeing Alcohol/Substance Abuse/Dependencies More than one psychiatric diagnosis Previous Psychiatric Diagnoses and Treatments Medical Diagnoses and Treatments/Surgeries  Cognitive Features That Contribute To Risk:  Polarized thinking    Suicide Risk:  Mild:  Suicidal ideation of limited frequency, intensity, duration, and specificity.  There are no identifiable plans, no associated intent, mild dysphoria and related symptoms, good self-control (both objective and subjective assessment), few other risk factors, and identifiable protective factors, including available and accessible social support.   Follow-up Information     [facility] Follow up.   Why: You may also call this provider personally, to schedule an appointment for therapy services. Contact information: [address] [place], [place] [address] [phone]         [facility] Follow up on 07/07/2023.   Why: You have a hospital follow up appointment for therapy and medication management services on 07/07/23 at 8:30 am.  This  will be a Virtual telehealth appt. Contact information: [address]  [address] [place] [address] [phone]         [facility] Call.   Why: Your information has been sent to this facility for substance use treatment. Please call this provider on Thursday afternoon or Friday morning regarding your admission status. Contact information: [address] [place] [address] [phone]                Plan Of Care/Follow-up recommendations:  Discharge Recommendations:  The patient is being discharged to a shelter. Patient is to take his discharge medications as ordered.  See follow up above. We recommend that he participates in individual therapy to target uncontrollable agitation and substance abuse.  We recommend that he participates in therapy to target personal conflict, to improve communication skills and conflict resolution skills. Patient is to initiate/implement a contingency based behavioral model to address his behavior. We recommend that he gets AIMS scale, height, weight, blood pressure, fasting lipid panel, fasting blood sugar in three months from discharge if he's on atypical antipsychotics.  Patient will benefit from monitoring of recurrent suicidal ideation since patient is on antidepressant medication. The patient should abstain from all illicit substances and alcohol. If the patient's symptoms worsen or do not continue to improve or if the patient becomes actively suicidal or homicidal then it is recommended that the patient return to the closest hospital emergency room or call 911 for further evaluation and treatment. National Suicide Prevention Lifeline 1800-SUICIDE or 251 764 9096. Please follow up with your primary medical doctor for all  other medical needs.  The patient has been educated on the possible side effects to medications and she/her guardian is to contact a medical professional and inform outpatient provider of any new  side effects of medication. He is to take regular diet and activity as tolerated.  Will benefit from moderate daily exercise. Patient and Family was educated about removing/locking any firearms, medications or dangerous products from the home.  Activity:  As tolerated Diet:  Regular Diet  Cecilie Lowers, [name] 06/30/2023, 1:41 PM

## 2023-06-30 NOTE — Progress Notes (Signed)
Discharge Note:  Patient discharged home via taxi service.  Patient denied SI and HI. Denied A/V hallucinations. Suicide prevention information given and discussed with patient who stated they understood and had no questions. Patient stated they received all their belongings, clothing, toiletries, misc items, etc. Patient stated they appreciated all assistance received from [facility] staff. All required discharge information given to patient.

## 2023-06-30 NOTE — Progress Notes (Signed)
   06/29/23 2100  Psych Admission Type (Psych Patients Only)  Admission Status Voluntary  Psychosocial Assessment  Patient Complaints Anxiety;Depression  Eye Contact Fair  Facial Expression Anxious  Affect Anxious;Depressed  Speech Logical/coherent  Interaction Minimal  Motor Activity Slow  Appearance/Hygiene Disheveled  Behavior Characteristics Cooperative  Mood Depressed;Anxious  Thought Process  Coherency WDL  Content WDL  Delusions None reported or observed  Perception WDL  Hallucination None reported or observed  Judgment Impaired  Confusion None  Danger to Self  Current suicidal ideation? Denies  Agreement Not to Harm Self Yes  Description of Agreement Verbal  Danger to Others  Danger to Others None reported or observed

## 2023-06-30 NOTE — Progress Notes (Signed)
  [facility] Adult Case Management Discharge Plan :  Will you be returning to the same living situation after discharge:  No. Pt will be discharging to the [facility] At discharge, do you have transportation home?: Yes,  CSW to arrange [employer] taxi for 1:00PM Do you have the ability to pay for your medications: Yes,  pt has [facility]  Release of information consent forms completed and in the chart;  Patient's signature needed at discharge.  Patient to Follow up at:  Follow-up Information     [facility] Follow up.   Why: You may also call this provider personally, to schedule an appointment for therapy services. Contact information: [address] [place], [place] [address] [phone]         [facility] Follow up on 07/07/2023.   Why: You have a hospital follow up appointment for therapy and medication management services on 07/07/23 at 8:30 am.  This will be a Virtual telehealth appt. Contact information: [address]  [address] [place] [address] [phone]         [facility] Call.   Why: Your information has been sent to this facility for substance use treatment. Please call this provider on Thursday afternoon or Friday morning regarding your admission status. Contact information: [address] [place] [address] [phone]                 Next level of care provider has access to [facility] Link:no  Safety Planning and Suicide Prevention discussed: No. Pt declined consents.     Has patient been referred to the Quitline?: Patient does not use tobacco/nicotine products  Patient has been referred for addiction treatment: Yes, referral information given but appointment not made Pt information was sent to [facility], as of this morning clinicals were still being reviewed. Pt will call this facility this afternoon or tomorrow morning regarding admission. (list  facility).  Kathi Der, LCSWA 06/30/2023, 9:30 AM

## 2023-07-04 ENCOUNTER — Encounter (HOSPITAL_COMMUNITY): Payer: Self-pay

## 2023-07-04 ENCOUNTER — Other Ambulatory Visit: Payer: Self-pay

## 2023-07-04 ENCOUNTER — Inpatient Hospital Stay (HOSPITAL_COMMUNITY)
Admission: EM | Admit: 2023-07-04 | Discharge: 2023-07-13 | DRG: 480 | Disposition: A | Discharge status: Skilled Nursing Facility | Payer: Medicare HMO | Attending: Student | Admitting: Student

## 2023-07-04 ENCOUNTER — Emergency Department (HOSPITAL_COMMUNITY): Payer: Medicare HMO

## 2023-07-04 DIAGNOSIS — S7290XA Unspecified fracture of unspecified femur, initial encounter for closed fracture: Secondary | ICD-10-CM | POA: Diagnosis present

## 2023-07-04 DIAGNOSIS — Z993 Dependence on wheelchair: Secondary | ICD-10-CM

## 2023-07-04 DIAGNOSIS — G8929 Other chronic pain: Secondary | ICD-10-CM | POA: Diagnosis present

## 2023-07-04 DIAGNOSIS — K5900 Constipation, unspecified: Secondary | ICD-10-CM | POA: Diagnosis present

## 2023-07-04 DIAGNOSIS — F1590 Other stimulant use, unspecified, uncomplicated: Secondary | ICD-10-CM | POA: Diagnosis not present

## 2023-07-04 DIAGNOSIS — G8254 Quadriplegia, C5-C7 incomplete: Secondary | ICD-10-CM | POA: Diagnosis present

## 2023-07-04 DIAGNOSIS — W050XXA Fall from non-moving wheelchair, initial encounter: Secondary | ICD-10-CM | POA: Diagnosis present

## 2023-07-04 DIAGNOSIS — E871 Hypo-osmolality and hyponatremia: Secondary | ICD-10-CM | POA: Diagnosis present

## 2023-07-04 DIAGNOSIS — F17290 Nicotine dependence, other tobacco product, uncomplicated: Secondary | ICD-10-CM | POA: Diagnosis present

## 2023-07-04 DIAGNOSIS — Z5941 Food insecurity: Secondary | ICD-10-CM | POA: Diagnosis not present

## 2023-07-04 DIAGNOSIS — N390 Urinary tract infection, site not specified: Secondary | ICD-10-CM | POA: Diagnosis present

## 2023-07-04 DIAGNOSIS — Z79899 Other long term (current) drug therapy: Secondary | ICD-10-CM | POA: Diagnosis not present

## 2023-07-04 DIAGNOSIS — E0781 Sick-euthyroid syndrome: Secondary | ICD-10-CM | POA: Diagnosis present

## 2023-07-04 DIAGNOSIS — S72001A Fracture of unspecified part of neck of right femur, initial encounter for closed fracture: Secondary | ICD-10-CM | POA: Diagnosis not present

## 2023-07-04 DIAGNOSIS — Z91148 Patient's other noncompliance with medication regimen for other reason: Secondary | ICD-10-CM

## 2023-07-04 DIAGNOSIS — M810 Age-related osteoporosis without current pathological fracture: Secondary | ICD-10-CM

## 2023-07-04 DIAGNOSIS — Z599 Problem related to housing and economic circumstances, unspecified: Secondary | ICD-10-CM

## 2023-07-04 DIAGNOSIS — Z9081 Acquired absence of spleen: Secondary | ICD-10-CM

## 2023-07-04 DIAGNOSIS — Z5982 Transportation insecurity: Secondary | ICD-10-CM | POA: Diagnosis not present

## 2023-07-04 DIAGNOSIS — Z9079 Acquired absence of other genital organ(s): Secondary | ICD-10-CM

## 2023-07-04 DIAGNOSIS — Y92002 Bathroom of unspecified non-institutional (private) residence single-family (private) house as the place of occurrence of the external cause: Secondary | ICD-10-CM | POA: Diagnosis not present

## 2023-07-04 DIAGNOSIS — F329 Major depressive disorder, single episode, unspecified: Secondary | ICD-10-CM | POA: Diagnosis present

## 2023-07-04 DIAGNOSIS — S72009A Fracture of unspecified part of neck of unspecified femur, initial encounter for closed fracture: Secondary | ICD-10-CM

## 2023-07-04 DIAGNOSIS — F333 Major depressive disorder, recurrent, severe with psychotic symptoms: Secondary | ICD-10-CM | POA: Diagnosis present

## 2023-07-04 DIAGNOSIS — S72091A Other fracture of head and neck of right femur, initial encounter for closed fracture: Secondary | ICD-10-CM | POA: Diagnosis present

## 2023-07-04 DIAGNOSIS — Z5900 Homelessness unspecified: Secondary | ICD-10-CM | POA: Diagnosis not present

## 2023-07-04 DIAGNOSIS — M8000XA Age-related osteoporosis with current pathological fracture, unspecified site, initial encounter for fracture: Secondary | ICD-10-CM | POA: Diagnosis not present

## 2023-07-04 DIAGNOSIS — F339 Major depressive disorder, recurrent, unspecified: Secondary | ICD-10-CM | POA: Diagnosis not present

## 2023-07-04 LAB — CBC WITH DIFFERENTIAL/PLATELET
Abs Immature Granulocytes: 0.03 10*3/uL (ref 0.00–0.07)
Basophils Absolute: 0.1 10*3/uL (ref 0.0–0.1)
Basophils Relative: 1 %
Eosinophils Absolute: 0.2 10*3/uL (ref 0.0–0.5)
Eosinophils Relative: 2 %
HCT: 40.3 % (ref 39.0–52.0)
Hemoglobin: 13.2 g/dL (ref 13.0–17.0)
Immature Granulocytes: 0 %
Lymphocytes Relative: 26 %
Lymphs Abs: 3 10*3/uL (ref 0.7–4.0)
MCH: 28.3 pg (ref 26.0–34.0)
MCHC: 32.8 g/dL (ref 30.0–36.0)
MCV: 86.5 fL (ref 80.0–100.0)
Monocytes Absolute: 1.6 10*3/uL — ABNORMAL HIGH (ref 0.1–1.0)
Monocytes Relative: 14 %
Neutro Abs: 6.9 10*3/uL (ref 1.7–7.7)
Neutrophils Relative %: 57 %
Platelets: 455 10*3/uL — ABNORMAL HIGH (ref 150–400)
RBC: 4.66 MIL/uL (ref 4.22–5.81)
RDW: 14.2 % (ref 11.5–15.5)
WBC: 11.8 10*3/uL — ABNORMAL HIGH (ref 4.0–10.5)
nRBC: 0 % (ref 0.0–0.2)

## 2023-07-04 LAB — PROTIME-INR
INR: 1 (ref 0.8–1.2)
Prothrombin Time: 13.4 s (ref 11.4–15.2)

## 2023-07-04 LAB — BASIC METABOLIC PANEL
Anion gap: 6 (ref 5–15)
BUN: 13 mg/dL (ref 6–20)
CO2: 25 mmol/L (ref 22–32)
Calcium: 8.6 mg/dL — ABNORMAL LOW (ref 8.9–10.3)
Chloride: 103 mmol/L (ref 98–111)
Creatinine, Ser: 0.5 mg/dL — ABNORMAL LOW (ref 0.61–1.24)
GFR, Estimated: >60 mL/min (ref >60)
Glucose, Bld: 92 mg/dL (ref 70–99)
Potassium: 3.6 mmol/L (ref 3.5–5.1)
Sodium: 134 mmol/L — ABNORMAL LOW (ref 135–145)

## 2023-07-04 LAB — TYPE AND SCREEN
ABO/RH(D): A POS
Antibody Screen: NEGATIVE

## 2023-07-04 LAB — URINALYSIS, W/ REFLEX TO CULTURE (INFECTION SUSPECTED)
Bilirubin Urine: NEGATIVE
Glucose, UA: NEGATIVE mg/dL
Ketones, ur: NEGATIVE mg/dL
Nitrite: NEGATIVE
Protein, ur: NEGATIVE mg/dL
RBC / HPF: >50 RBC/hpf (ref 0–5)
Specific Gravity, Urine: 1.018 (ref 1.005–1.030)
WBC, UA: >50 WBC/hpf (ref 0–5)
pH: 7 (ref 5.0–8.0)

## 2023-07-04 LAB — APTT: aPTT: 40 s — ABNORMAL HIGH (ref 24–36)

## 2023-07-04 LAB — ABO/RH: ABO/RH(D): A POS

## 2023-07-04 MED ORDER — CELECOXIB 200 MG PO CAPS
200.0000 mg | ORAL_CAPSULE | Freq: Two times a day (BID) | ORAL | Status: AC
  Administered 2023-07-04 – 2023-07-09 (×9): 200 mg via ORAL
  Filled 2023-07-04 (×10): qty 1

## 2023-07-04 MED ORDER — HYDROMORPHONE HCL 2 MG/ML IJ SOLN
1.5000 mg | INTRAMUSCULAR | Status: AC
Start: 2023-07-04 — End: 2023-07-04
  Administered 2023-07-04: 1.5 mg via INTRAVENOUS
  Filled 2023-07-04: qty 1

## 2023-07-04 MED ORDER — HYDROMORPHONE HCL 1 MG/ML IJ SOLN
1.0000 mg | Freq: Once | INTRAMUSCULAR | Status: AC
  Administered 2023-07-04: 1 mg via INTRAVENOUS
  Filled 2023-07-04: qty 1

## 2023-07-04 MED ORDER — OXYCODONE HCL 5 MG PO TABS
5.0000 mg | ORAL_TABLET | ORAL | Status: DC | PRN
  Administered 2023-07-05 (×2): 5 mg via ORAL
  Filled 2023-07-04 (×2): qty 1

## 2023-07-04 MED ORDER — POLYETHYLENE GLYCOL 3350 17 G PO PACK: 17.0000 g | PACK | Freq: Every day | ORAL | Status: DC | PRN

## 2023-07-04 MED ORDER — ACETAMINOPHEN 325 MG PO TABS
650.0000 mg | ORAL_TABLET | ORAL | Status: DC
  Administered 2023-07-04 – 2023-07-05 (×3): 650 mg via ORAL
  Filled 2023-07-04 (×3): qty 2

## 2023-07-04 MED ORDER — SULFAMETHOXAZOLE-TRIMETHOPRIM 800-160 MG PO TABS
1.0000 | ORAL_TABLET | Freq: Two times a day (BID) | ORAL | Status: AC
  Administered 2023-07-05 – 2023-07-09 (×10): 1 via ORAL
  Filled 2023-07-04 (×11): qty 1

## 2023-07-04 MED ORDER — ENOXAPARIN SODIUM 40 MG/0.4ML IJ SOSY
40.0000 mg | PREFILLED_SYRINGE | Freq: Every day | INTRAMUSCULAR | Status: DC
  Administered 2023-07-04 – 2023-07-12 (×9): 40 mg via SUBCUTANEOUS
  Filled 2023-07-04 (×9): qty 0.4

## 2023-07-04 MED ORDER — HYDROMORPHONE HCL 1 MG/ML IJ SOLN
0.5000 mg | INTRAMUSCULAR | Status: DC | PRN
  Administered 2023-07-04 – 2023-07-05 (×4): 0.5 mg via INTRAVENOUS
  Filled 2023-07-04 (×5): qty 0.5

## 2023-07-04 MED ORDER — OXYCODONE HCL ER 10 MG PO T12A
10.0000 mg | EXTENDED_RELEASE_TABLET | Freq: Every day | ORAL | Status: DC
  Administered 2023-07-04: 10 mg via ORAL
  Filled 2023-07-04: qty 1

## 2023-07-04 MED ORDER — SODIUM CHLORIDE 0.9% FLUSH
3.0000 mL | Freq: Two times a day (BID) | INTRAVENOUS | Status: DC
  Administered 2023-07-04 – 2023-07-12 (×17): 3 mL via INTRAVENOUS

## 2023-07-04 MED ORDER — SULFAMETHOXAZOLE-TRIMETHOPRIM 800-160 MG PO TABS
1.0000 | ORAL_TABLET | Freq: Once | ORAL | Status: AC
  Administered 2023-07-04: 1 via ORAL
  Filled 2023-07-04: qty 1

## 2023-07-04 MED ORDER — SENNOSIDES-DOCUSATE SODIUM 8.6-50 MG PO TABS
2.0000 | ORAL_TABLET | Freq: Two times a day (BID) | ORAL | Status: DC
  Administered 2023-07-05 – 2023-07-11 (×11): 2 via ORAL
  Filled 2023-07-04 (×16): qty 2
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
## 2023-07-04 NOTE — Assessment & Plan Note (Signed)
Chronic diagnosis . Patient not taking any meds. Currenlty not suicidal. I wil request IP psych eval in AM

## 2023-07-04 NOTE — ED Notes (Signed)
The room is still being cleaned.

## 2023-07-04 NOTE — Assessment & Plan Note (Signed)
Dr. Eulah Pont engaged by the ER provider, verbally I am told that the plan is to perform an ORIF for the patient tomorrow.  We will treat the patient with Lovenox for DVT prophylaxis.  Patient reports uncontrolled pain with only partial response to 1 mg of Dilaudid.  At this time I will give the patient 1.5 mg of Dilaudid.  I will start the patient on multimodal pain regimen including standing acetaminophen and Celebrex.  Patient will get OxyContin 10 mg at night for 2 nights.  And get 5 mg of oxycodone p.o. every 3 hours as needed for moderate pain.  For breakthrough pain or severe pain I will order half a milligram of Dilaudid every 1 hours as needed.  Discussed with patient to seek prompt pain control before pain gets to uncontrolled level if possible.  Bowel regimen ordered for constipation prophylaxis
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
## 2023-07-04 NOTE — ED Provider Notes (Signed)
[place] EMERGENCY DEPARTMENT AT [facility] Provider Note   CSN: [id number] Arrival date & time: 07/04/23  1359     History  No chief complaint on file.   Mark Porter is a 57 y.o. male.  HPI 57 year old male presents with a fall and right hip pain.  He has been wheelchair-bound for many years and he was transferring yesterday and fell and injured his right hip.  Primarily hurting in his posterior hip.  Friend was able to get him up and transfer him to the bed.  He is having a lot of spasms and severe pain.  He did not injure his back and has no new weakness or numbness.  No other injuries.  Home Medications Prior to Admission medications   Medication Sig Start Date End Date Taking? Authorizing Provider  ARIPiprazole (ABILIFY) 10 MG tablet Take 1 tablet (10 mg total) by mouth daily. 07/01/23   Mark Porter, Mark Oka, Porter  cyclobenzaprine (FLEXERIL) 5 MG tablet Take 1 tablet (5 mg total) by mouth every 8 (eight) hours as needed for muscle spasms. 06/30/23   Mark Porter, Mark Oka, Porter  ibuprofen (ADVIL) 600 MG tablet Take 1 tablet (600 mg total) by mouth 3 (three) times daily. 06/30/23   Mark Lowers, Porter  traZODone (DESYREL) 50 MG tablet Take 1 tablet (50 mg total) by mouth at bedtime as needed for sleep. 06/30/23   Mark Porter, Mark Oka, Porter  venlafaxine XR (EFFEXOR-XR) 75 MG 24 hr capsule Take 1 capsule (75 mg total) by mouth daily with breakfast. 07/01/23   Mark Porter, Mark Oka, Porter  vitamin D3 (CHOLECALCIFEROL) 25 MCG tablet Take 2 tablets (2,000 Units total) by mouth daily. 07/01/23   Mark Lowers, Porter      Allergies    Carisoprodol    Review of Systems   Review of Systems  Musculoskeletal:  Positive for arthralgias. Negative for back pain.    Physical Exam Updated Vital Signs BP 112/73 (BP Location: Left Arm)   Pulse 86   Temp (!) 97.5 F (36.4 C) (Oral)   Resp 20   Ht 5\' 7"  (1.702 m)   Wt 77.1 kg   SpO2 98%   BMI 26.63 kg/m  Physical Exam Vitals and nursing note reviewed.   Constitutional:      Appearance: He is well-developed.  HENT:     Head: Normocephalic and atraumatic.  Cardiovascular:     Rate and Rhythm: Normal rate and regular rhythm.     Pulses:          Posterior tibial pulses are 2+ on the right side.  Pulmonary:     Effort: Pulmonary effort is normal.  Musculoskeletal:     Lumbar back: No tenderness.     Right hip: Tenderness present. Decreased range of motion.     Right knee: Tenderness present.     Right lower leg: No tenderness.     Comments: Patient is holding his knee and hip in some flexion  Skin:    General: Skin is warm and dry.  Neurological:     Mental Status: He is alert.     ED Results / Procedures / Treatments   Labs (all labs ordered are listed, but only abnormal results are displayed) Labs Reviewed  BASIC METABOLIC PANEL  CBC WITH DIFFERENTIAL/PLATELET  PROTIME-INR  URINALYSIS, W/ REFLEX TO CULTURE (INFECTION SUSPECTED)  TYPE AND SCREEN    EKG None  Radiology DG Hip Unilat  With Pelvis 2-3 Views Right Result Date: 07/04/2023  CLINICAL DATA:  Right hip pain after fall EXAM: RIGHT KNEE - COMPLETE 4 VIEW; DG HIP (WITH PELVIS) 2V RIGHT COMPARISON:  None Available. FINDINGS: Acute minimally displaced fracture of the right femoral neck. No intra-articular extension. No additional acute fracture or dislocation is visualized. Overlying bowel gas partially obscures osseous detail of the sacrum. No diastasis. 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 No soft tissue abnormalities. IMPRESSION: Acute minimally displaced fracture of the right femoral neck. Electronically Signed   By: Mark Porter M.D.   On: 07/04/2023 16:34   DG Knee Complete 4 Views Right Result Date: 07/04/2023 CLINICAL DATA:  Right hip pain after fall EXAM: RIGHT KNEE - COMPLETE 4 VIEW; DG HIP (WITH PELVIS) 2V RIGHT COMPARISON:  None Available. FINDINGS: Acute minimally displaced fracture of the right  femoral neck. No intra-articular extension. No additional acute fracture or dislocation is visualized. Overlying bowel gas partially obscures osseous detail of the sacrum. No diastasis. 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 No soft tissue abnormalities. IMPRESSION: Acute minimally displaced fracture of the right femoral neck. Electronically Signed   By: Mark Porter M.D.   On: 07/04/2023 16:34    Procedures Procedures    Medications Ordered in ED Medications  HYDROmorphone (DILAUDID) injection 1 mg (1 mg Intravenous Given 07/04/23 1445)    ED Course/ Medical Decision Making/ A&P                                Medical Decision Making Amount and/or Complexity of Data Reviewed Labs: ordered. Radiology: ordered and independent interpretation performed.    Details: Femoral neck fracture.  Risk Prescription drug management. Decision regarding hospitalization.   Patient is found to have a right-sided femoral neck fracture.  He was given IV Dilaudid for pain.  He is also complaining of some dysuria, states he was recently treated for an infection but is having dysuria for about the last week or so.  Labs and urinalysis pending.  Orthopedics consulted, Dr. Eulah Porter will plan on taking him to the OR tomorrow at [facility]. Will need hospitalist admission after labs. Care transferred to Dr. Suezanne Porter.        Final Clinical Impression(s) / ED Diagnoses Final diagnoses:  Closed fracture of neck of right femur, initial encounter [facility])    Rx / DC Orders ED Discharge Orders     None         Mark Loveless, MD 07/04/23 1721

## 2023-07-04 NOTE — ED Provider Notes (Signed)
   ED Course / MDM   Clinical Course as of 07/04/23 1852  Mon Jul 04, 2023  1712 Received sign out from Dr. Criss Alvine pending labs. Right femoral neck fracture. Dr. Eulah Pont consulted.  [WS]  1839 Urinalysis have signs of a urinary infection.  He was hospitalized in November for urine infection and discharged on 4 weeks of Bactrim.  He does report some ongoing dysuria.  Will give additional dose of Bactrim.  He reports he has not finished his 4-week course.  Urine culture also sent in case there is alternative urinary pathogen present.  Denies any fevers or systemic infectious symptoms. [WS]  1852 Discussed with Dr. Maryjean Ka who will admit the patient. [WS]    Clinical Course User Index [WS] Lonell Grandchild, MD   Medical Decision Making Amount and/or Complexity of Data Reviewed Labs: ordered. Radiology: ordered.  Risk Prescription drug management. Decision regarding hospitalization.         Lonell Grandchild, MD 07/04/23 [phone]

## 2023-07-04 NOTE — ED Notes (Signed)
Patient transported to X-ray 

## 2023-07-04 NOTE — Assessment & Plan Note (Signed)
Patient only 57 years old and had seating height fall complicated by right femoral neck fracture.  Will check TSH and vitamin D level in the morning.  Suspect disuse atrophy/osteoporosis of the bone.

## 2023-07-04 NOTE — Assessment & Plan Note (Signed)
Per hisotry patinet was recently diagnosed with UTI about 5 days ago given complaitn of dysuria. However, has been missing a lot of his abx doses that he was prescribed. Still having dysuria. Urine culture drawn by ER attending. Started on bactirm. C/w same.

## 2023-07-04 NOTE — ED Triage Notes (Signed)
Pt BIB [facility] from home. Pt presents with R sided hip pain after having a fall yesterday. EMS report pt denies head injury, LOC, or blood thinners.   EMS Vitals  112/72 HR 94

## 2023-07-05 ENCOUNTER — Inpatient Hospital Stay (HOSPITAL_COMMUNITY): Payer: Medicare HMO | Admitting: Anesthesiology

## 2023-07-05 ENCOUNTER — Other Ambulatory Visit: Payer: Self-pay

## 2023-07-05 ENCOUNTER — Inpatient Hospital Stay (HOSPITAL_COMMUNITY): Payer: Medicare HMO

## 2023-07-05 ENCOUNTER — Encounter (HOSPITAL_COMMUNITY)
Admission: EM | Disposition: A | Discharge status: Skilled Nursing Facility | Payer: Self-pay | Source: Home / Self Care | Attending: Student

## 2023-07-05 DIAGNOSIS — F1590 Other stimulant use, unspecified, uncomplicated: Secondary | ICD-10-CM | POA: Diagnosis not present

## 2023-07-05 DIAGNOSIS — F333 Major depressive disorder, recurrent, severe with psychotic symptoms: Secondary | ICD-10-CM

## 2023-07-05 DIAGNOSIS — M8000XA Age-related osteoporosis with current pathological fracture, unspecified site, initial encounter for fracture: Secondary | ICD-10-CM

## 2023-07-05 DIAGNOSIS — N390 Urinary tract infection, site not specified: Secondary | ICD-10-CM

## 2023-07-05 DIAGNOSIS — S72001A Fracture of unspecified part of neck of right femur, initial encounter for closed fracture: Secondary | ICD-10-CM

## 2023-07-05 DIAGNOSIS — G8254 Quadriplegia, C5-C7 incomplete: Secondary | ICD-10-CM | POA: Diagnosis not present

## 2023-07-05 HISTORY — PX: HIP PINNING,CANNULATED: SHX1758

## 2023-07-05 LAB — HEPATIC FUNCTION PANEL
ALT: 119 U/L — ABNORMAL HIGH (ref 0–44)
AST: 78 U/L — ABNORMAL HIGH (ref 15–41)
Albumin: 3.2 g/dL — ABNORMAL LOW (ref 3.5–5.0)
Alkaline Phosphatase: 129 U/L — ABNORMAL HIGH (ref 38–126)
Bilirubin, Direct: 0.2 mg/dL (ref 0.0–0.2)
Indirect Bilirubin: 0.5 mg/dL (ref 0.3–0.9)
Total Bilirubin: 0.7 mg/dL (ref <1.2)
Total Protein: 7.3 g/dL (ref 6.5–8.1)

## 2023-07-05 LAB — SURGICAL PCR SCREEN
MRSA, PCR: NEGATIVE
Staphylococcus aureus: NEGATIVE

## 2023-07-05 LAB — URINE CULTURE: Culture: <10000 — AB

## 2023-07-05 LAB — PROTIME-INR
INR: 1 (ref 0.8–1.2)
Prothrombin Time: 13.4 s (ref 11.4–15.2)

## 2023-07-05 LAB — CBC
HCT: 37.5 % — ABNORMAL LOW (ref 39.0–52.0)
Hemoglobin: 12.2 g/dL — ABNORMAL LOW (ref 13.0–17.0)
MCH: 28.2 pg (ref 26.0–34.0)
MCHC: 32.5 g/dL (ref 30.0–36.0)
MCV: 86.6 fL (ref 80.0–100.0)
Platelets: 427 10*3/uL — ABNORMAL HIGH (ref 150–400)
RBC: 4.33 MIL/uL (ref 4.22–5.81)
RDW: 14 % (ref 11.5–15.5)
WBC: 9.3 10*3/uL (ref 4.0–10.5)
nRBC: 0 % (ref 0.0–0.2)

## 2023-07-05 LAB — BASIC METABOLIC PANEL
Anion gap: 8 (ref 5–15)
BUN: 11 mg/dL (ref 6–20)
CO2: 24 mmol/L (ref 22–32)
Calcium: 8.5 mg/dL — ABNORMAL LOW (ref 8.9–10.3)
Chloride: 101 mmol/L (ref 98–111)
Creatinine, Ser: 0.71 mg/dL (ref 0.61–1.24)
GFR, Estimated: >60 mL/min (ref >60)
Glucose, Bld: 111 mg/dL — ABNORMAL HIGH (ref 70–99)
Potassium: 3.7 mmol/L (ref 3.5–5.1)
Sodium: 133 mmol/L — ABNORMAL LOW (ref 135–145)

## 2023-07-05 LAB — TSH: TSH: 7.562 u[IU]/mL — ABNORMAL HIGH (ref 0.350–4.500)

## 2023-07-05 LAB — VITAMIN D 25 HYDROXY (VIT D DEFICIENCY, FRACTURES): Vit D, 25-Hydroxy: 30.73 ng/mL (ref 30–100)

## 2023-07-05 SURGERY — FIXATION, FEMUR, NECK, PERCUTANEOUS, USING SCREW
Anesthesia: General | Site: Hip | Laterality: Right

## 2023-07-05 MED ORDER — ONDANSETRON HCL 4 MG PO TABS: 4.0000 mg | ORAL_TABLET | Freq: Four times a day (QID) | ORAL | Status: DC | PRN

## 2023-07-05 MED ORDER — LIDOCAINE HCL (CARDIAC) PF 100 MG/5ML IV SOSY
PREFILLED_SYRINGE | INTRAVENOUS | Status: DC | PRN
  Administered 2023-07-05: 40 mg via INTRAVENOUS

## 2023-07-05 MED ORDER — ROCURONIUM BROMIDE 10 MG/ML (PF) SYRINGE
PREFILLED_SYRINGE | INTRAVENOUS | Status: AC
  Filled 2023-07-05: qty 10

## 2023-07-05 MED ORDER — LACTATED RINGERS IV SOLN: INTRAVENOUS | Status: DC | PRN

## 2023-07-05 MED ORDER — FENTANYL CITRATE (PF) 100 MCG/2ML IJ SOLN
INTRAMUSCULAR | Status: AC
  Filled 2023-07-05: qty 2

## 2023-07-05 MED ORDER — TRANEXAMIC ACID-NACL 1000-0.7 MG/100ML-% IV SOLN
1000.0000 mg | Freq: Once | INTRAVENOUS | Status: AC
  Administered 2023-07-05: 1000 mg via INTRAVENOUS

## 2023-07-05 MED ORDER — PHENYLEPHRINE 80 MCG/ML (10ML) SYRINGE FOR IV PUSH (FOR BLOOD PRESSURE SUPPORT)
PREFILLED_SYRINGE | INTRAVENOUS | Status: AC
  Filled 2023-07-05: qty 10

## 2023-07-05 MED ORDER — ROCURONIUM BROMIDE 100 MG/10ML IV SOLN
INTRAVENOUS | Status: DC | PRN
  Administered 2023-07-05: 50 mg via INTRAVENOUS

## 2023-07-05 MED ORDER — SUGAMMADEX SODIUM 200 MG/2ML IV SOLN
INTRAVENOUS | Status: DC | PRN
  Administered 2023-07-05: 200 mg via INTRAVENOUS

## 2023-07-05 MED ORDER — OXYCODONE HCL 5 MG PO TABS
5.0000 mg | ORAL_TABLET | Freq: Once | ORAL | Status: AC | PRN
  Administered 2023-07-05: 5 mg via ORAL

## 2023-07-05 MED ORDER — ONDANSETRON HCL 4 MG/2ML IJ SOLN
INTRAMUSCULAR | Status: AC
Start: 2023-07-05 — End: ?
  Filled 2023-07-05: qty 2

## 2023-07-05 MED ORDER — PHENOL 1.4 % MT LIQD: 1.0000 | OROMUCOSAL | Status: DC | PRN

## 2023-07-05 MED ORDER — PANTOPRAZOLE SODIUM 40 MG PO TBEC
40.0000 mg | DELAYED_RELEASE_TABLET | Freq: Every day | ORAL | Status: DC
  Administered 2023-07-05 – 2023-07-13 (×9): 40 mg via ORAL
  Filled 2023-07-05 (×9): qty 1

## 2023-07-05 MED ORDER — ARIPIPRAZOLE 10 MG PO TABS
10.0000 mg | ORAL_TABLET | Freq: Every day | ORAL | Status: DC
  Administered 2023-07-05 – 2023-07-12 (×8): 10 mg via ORAL
  Filled 2023-07-05 (×8): qty 1

## 2023-07-05 MED ORDER — DEXAMETHASONE SODIUM PHOSPHATE 4 MG/ML IJ SOLN
INTRAMUSCULAR | Status: DC | PRN
  Administered 2023-07-05: 8 mg via INTRAVENOUS

## 2023-07-05 MED ORDER — CEFAZOLIN SODIUM-DEXTROSE 2-4 GM/100ML-% IV SOLN
2.0000 g | INTRAVENOUS | Status: AC
  Administered 2023-07-05: 2 g via INTRAVENOUS
  Filled 2023-07-05: qty 100

## 2023-07-05 MED ORDER — MIDAZOLAM HCL 5 MG/5ML IJ SOLN
INTRAMUSCULAR | Status: DC | PRN
  Administered 2023-07-05: 2 mg via INTRAVENOUS

## 2023-07-05 MED ORDER — DEXAMETHASONE SODIUM PHOSPHATE 10 MG/ML IJ SOLN: 8.0000 mg | Freq: Once | INTRAMUSCULAR | Status: DC

## 2023-07-05 MED ORDER — ACETAMINOPHEN 500 MG PO TABS
1000.0000 mg | ORAL_TABLET | Freq: Once | ORAL | Status: AC
  Administered 2023-07-05: 1000 mg via ORAL
  Filled 2023-07-05: qty 2

## 2023-07-05 MED ORDER — DEXAMETHASONE SODIUM PHOSPHATE 10 MG/ML IJ SOLN
INTRAMUSCULAR | Status: AC
  Filled 2023-07-05: qty 1

## 2023-07-05 MED ORDER — ACETAMINOPHEN 325 MG PO TABS: 325.0000 mg | ORAL_TABLET | Freq: Four times a day (QID) | ORAL | Status: DC | PRN

## 2023-07-05 MED ORDER — TRAZODONE HCL 50 MG PO TABS
50.0000 mg | ORAL_TABLET | Freq: Every evening | ORAL | Status: DC | PRN
  Administered 2023-07-05 – 2023-07-12 (×6): 50 mg via ORAL
  Filled 2023-07-05 (×6): qty 1

## 2023-07-05 MED ORDER — MIDAZOLAM HCL 2 MG/2ML IJ SOLN
INTRAMUSCULAR | Status: AC
Start: 2023-07-05 — End: ?
  Filled 2023-07-05: qty 2

## 2023-07-05 MED ORDER — ONDANSETRON HCL 4 MG/2ML IJ SOLN
INTRAMUSCULAR | Status: DC | PRN
  Administered 2023-07-05: 4 mg via INTRAVENOUS

## 2023-07-05 MED ORDER — DOCUSATE SODIUM 100 MG PO CAPS
100.0000 mg | ORAL_CAPSULE | Freq: Two times a day (BID) | ORAL | Status: DC
  Administered 2023-07-05 – 2023-07-07 (×5): 100 mg via ORAL
  Filled 2023-07-05 (×6): qty 1

## 2023-07-05 MED ORDER — METHOCARBAMOL 1000 MG/10ML IJ SOLN: 500.0000 mg | Freq: Four times a day (QID) | INTRAMUSCULAR | Status: DC | PRN

## 2023-07-05 MED ORDER — ONDANSETRON HCL 4 MG/2ML IJ SOLN: 4.0000 mg | Freq: Four times a day (QID) | INTRAMUSCULAR | Status: DC | PRN

## 2023-07-05 MED ORDER — VENLAFAXINE HCL ER 75 MG PO CP24
75.0000 mg | ORAL_CAPSULE | Freq: Every day | ORAL | Status: DC
  Administered 2023-07-06 – 2023-07-13 (×8): 75 mg via ORAL
  Filled 2023-07-05 (×8): qty 1

## 2023-07-05 MED ORDER — FENTANYL CITRATE (PF) 100 MCG/2ML IJ SOLN
INTRAMUSCULAR | Status: DC | PRN
  Administered 2023-07-05: 100 ug via INTRAVENOUS

## 2023-07-05 MED ORDER — HYDROMORPHONE HCL 1 MG/ML IJ SOLN
0.5000 mg | INTRAMUSCULAR | Status: DC | PRN
Start: 2023-07-05 — End: 2023-07-09
  Administered 2023-07-05 – 2023-07-07 (×10): 1 mg via INTRAVENOUS
  Administered 2023-07-07 (×3): 0.5 mg via INTRAVENOUS
  Administered 2023-07-07: 1 mg via INTRAVENOUS
  Administered 2023-07-07 – 2023-07-08 (×2): 0.5 mg via INTRAVENOUS
  Administered 2023-07-08: 1 mg via INTRAVENOUS
  Administered 2023-07-08 (×3): 0.5 mg via INTRAVENOUS
  Administered 2023-07-09: 1 mg via INTRAVENOUS
  Administered 2023-07-09: 0.5 mg via INTRAVENOUS
  Filled 2023-07-05 (×22): qty 1

## 2023-07-05 MED ORDER — TRANEXAMIC ACID-NACL 1000-0.7 MG/100ML-% IV SOLN
INTRAVENOUS | Status: AC
  Filled 2023-07-05: qty 100

## 2023-07-05 MED ORDER — POVIDONE-IODINE 10 % EX SWAB: 2.0000 | Freq: Once | CUTANEOUS | Status: DC

## 2023-07-05 MED ORDER — CEFAZOLIN SODIUM-DEXTROSE 2-4 GM/100ML-% IV SOLN
2.0000 g | Freq: Four times a day (QID) | INTRAVENOUS | Status: AC
  Administered 2023-07-05 (×2): 2 g via INTRAVENOUS
  Filled 2023-07-05 (×2): qty 100

## 2023-07-05 MED ORDER — MENTHOL 3 MG MT LOZG: 1.0000 | LOZENGE | OROMUCOSAL | Status: DC | PRN

## 2023-07-05 MED ORDER — OXYCODONE HCL 5 MG PO TABS
ORAL_TABLET | ORAL | Status: AC
  Filled 2023-07-05: qty 1

## 2023-07-05 MED ORDER — FENTANYL CITRATE PF 50 MCG/ML IJ SOSY
PREFILLED_SYRINGE | INTRAMUSCULAR | Status: AC
  Filled 2023-07-05: qty 2

## 2023-07-05 MED ORDER — TRANEXAMIC ACID-NACL 1000-0.7 MG/100ML-% IV SOLN
1000.0000 mg | INTRAVENOUS | Status: AC
  Administered 2023-07-05: 1000 mg via INTRAVENOUS
  Filled 2023-07-05: qty 100

## 2023-07-05 MED ORDER — OXYCODONE HCL 5 MG/5ML PO SOLN: 5.0000 mg | Freq: Once | ORAL | Status: AC | PRN

## 2023-07-05 MED ORDER — FENTANYL CITRATE PF 50 MCG/ML IJ SOSY
25.0000 ug | PREFILLED_SYRINGE | INTRAMUSCULAR | Status: DC | PRN
  Administered 2023-07-05 (×2): 50 ug via INTRAVENOUS

## 2023-07-05 MED ORDER — OXYCODONE HCL 5 MG PO TABS
10.0000 mg | ORAL_TABLET | ORAL | Status: DC | PRN
  Administered 2023-07-05 – 2023-07-07 (×10): 10 mg via ORAL
  Filled 2023-07-05 (×11): qty 2

## 2023-07-05 MED ORDER — OXYCODONE HCL 5 MG PO TABS: 5.0000 mg | ORAL_TABLET | ORAL | Status: DC | PRN

## 2023-07-05 MED ORDER — PROPOFOL 10 MG/ML IV BOLUS
INTRAVENOUS | Status: DC | PRN
  Administered 2023-07-05: 200 mg via INTRAVENOUS

## 2023-07-05 MED ORDER — ALUM & MAG HYDROXIDE-SIMETH 200-200-20 MG/5ML PO SUSP
30.0000 mL | ORAL | Status: DC | PRN
  Administered 2023-07-06 – 2023-07-07 (×3): 30 mL via ORAL
  Filled 2023-07-05 (×3): qty 30

## 2023-07-05 MED ORDER — METOCLOPRAMIDE HCL 5 MG PO TABS
5.0000 mg | ORAL_TABLET | Freq: Three times a day (TID) | ORAL | Status: DC | PRN
Start: 2023-07-05 — End: 2023-07-13

## 2023-07-05 MED ORDER — METHOCARBAMOL 500 MG PO TABS
500.0000 mg | ORAL_TABLET | Freq: Four times a day (QID) | ORAL | Status: DC | PRN
  Administered 2023-07-05 – 2023-07-13 (×18): 500 mg via ORAL
  Filled 2023-07-05 (×18): qty 1

## 2023-07-05 MED ORDER — ACETAMINOPHEN 500 MG PO TABS
1000.0000 mg | ORAL_TABLET | Freq: Four times a day (QID) | ORAL | Status: AC
  Administered 2023-07-05 – 2023-07-06 (×3): 1000 mg via ORAL
  Filled 2023-07-05 (×3): qty 2

## 2023-07-05 MED ORDER — ACETAMINOPHEN 325 MG PO TABS: 650.0000 mg | ORAL_TABLET | Freq: Four times a day (QID) | ORAL | Status: DC | PRN

## 2023-07-05 MED ORDER — METOCLOPRAMIDE HCL 5 MG/ML IJ SOLN: 5.0000 mg | Freq: Three times a day (TID) | INTRAMUSCULAR | Status: DC | PRN

## 2023-07-05 MED ORDER — PHENYLEPHRINE HCL (PRESSORS) 10 MG/ML IV SOLN
INTRAVENOUS | Status: DC | PRN
  Administered 2023-07-05: 160 ug via INTRAVENOUS
  Administered 2023-07-05: 200 ug via INTRAVENOUS
  Administered 2023-07-05: 160 ug via INTRAVENOUS

## 2023-07-05 SURGICAL SUPPLY — 30 items
BAG COUNTER SPONGE SURGICOUNT (BAG) ×1 IMPLANT
BIT DRILL 4.9 CANNULATED (BIT) ×1
BIT DRILL CANN QC 4.9 LRG (BIT) IMPLANT
BNDG COHESIVE 4X5 TAN STRL LF (GAUZE/BANDAGES/DRESSINGS) ×1 IMPLANT
BNDG GAUZE DERMACEA FLUFF 4 (GAUZE/BANDAGES/DRESSINGS) IMPLANT
COVER PERINEAL POST (MISCELLANEOUS) ×1 IMPLANT
COVER SURGICAL LIGHT HANDLE (MISCELLANEOUS) ×1 IMPLANT
DRAPE STERI IOBAN 125X83 (DRAPES) ×1 IMPLANT
DRESSING MEPILEX FLEX 4X4 (GAUZE/BANDAGES/DRESSINGS) ×1 IMPLANT
DRSG MEPILEX FLEX 4X4 (GAUZE/BANDAGES/DRESSINGS) ×1
DURAPREP 26ML APPLICATOR (WOUND CARE) ×1 IMPLANT
ELECT REM PT RETURN 15FT ADLT (MISCELLANEOUS) ×1 IMPLANT
GLOVE BIO SURGEON STRL SZ7.5 (GLOVE) ×2 IMPLANT
GLOVE BIOGEL PI IND STRL 7.5 (GLOVE) ×1 IMPLANT
GLOVE BIOGEL PI IND STRL 8 (GLOVE) ×1 IMPLANT
GOWN STRL REUS W/ TWL LRG LVL3 (GOWN DISPOSABLE) ×1 IMPLANT
GUIDEWIRE ASNIS 3.2 NONCAL (WIRE) IMPLANT
KIT BASIN OR (CUSTOM PROCEDURE TRAY) ×1 IMPLANT
KIT TURNOVER KIT A (KITS) IMPLANT
MANIFOLD NEPTUNE II (INSTRUMENTS) ×1 IMPLANT
NS IRRIG 1000ML POUR BTL (IV SOLUTION) ×1 IMPLANT
PACK GENERAL/GYN (CUSTOM PROCEDURE TRAY) ×1 IMPLANT
PAD ARMBOARD 7.5X6 YLW CONV (MISCELLANEOUS) ×2 IMPLANT
SCREW ASNIS 85MM (Screw) IMPLANT
SCREW ASNIS 90MM (Screw) IMPLANT
STRIP CLOSURE SKIN 1/2X4 (GAUZE/BANDAGES/DRESSINGS) ×1 IMPLANT
SUT VIC AB 2-0 CT1 TAPERPNT 27 (SUTURE) ×1 IMPLANT
SUT VIC AB 4-0 PS2 18 (SUTURE) ×1 IMPLANT
TOWEL OR 17X26 10 PK STRL BLUE (TOWEL DISPOSABLE) ×1 IMPLANT
WATER STERILE IRR 1000ML POUR (IV SOLUTION) ×2 IMPLANT

## 2023-07-05 NOTE — Anesthesia Postprocedure Evaluation (Signed)
Anesthesia Post Note  Patient: Mark Porter  Procedure(s) Performed: PERCUTANEOUS FIXATION OF FEMORAL NECK (Right: Hip)     Patient location during evaluation: PACU Anesthesia Type: General Level of consciousness: awake and alert Pain management: pain level controlled Vital Signs Assessment: post-procedure vital signs reviewed and stable Respiratory status: spontaneous breathing, nonlabored ventilation, respiratory function stable and patient connected to nasal cannula oxygen Cardiovascular status: blood pressure returned to baseline and stable Postop Assessment: no apparent nausea or vomiting Anesthetic complications: no   No notable events documented.  Last Vitals:  Vitals:   07/05/23 1145 07/05/23 1225  BP: 110/72 (!) 146/73  Pulse: 79 94  Resp: 17 18  Temp:    SpO2: 92% 95%    Last Pain:  Vitals:   07/05/23 1243  TempSrc:   PainSc: 5                  Atisha Hamidi S

## 2023-07-05 NOTE — Anesthesia Procedure Notes (Signed)
Procedure Name: Intubation Date/Time: 07/05/2023 9:43 AM  Performed by: Floydene Flock, CRNAPre-anesthesia Checklist: Patient identified, Emergency Drugs available, Suction available, Patient being monitored and Timeout performed Patient Re-evaluated:Patient Re-evaluated prior to induction Oxygen Delivery Method: Circle system utilized Preoxygenation: Pre-oxygenation with 100% oxygen Induction Type: IV induction Ventilation: Mask ventilation without difficulty Laryngoscope Size: Mac and 3 Grade View: Grade I Tube type: Oral Tube size: 7.5 mm Number of attempts: 1 Airway Equipment and Method: Stylet Placement Confirmation: ETT inserted through vocal cords under direct vision and positive ETCO2 Secured at: 22 cm Tube secured with: Tape Dental Injury: Teeth and Oropharynx as per pre-operative assessment

## 2023-07-05 NOTE — Discharge Instructions (Signed)
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
## 2023-07-05 NOTE — Plan of Care (Signed)
  Problem: Activity: Goal: Risk for activity intolerance will decrease Outcome: Progressing   Problem: Pain Management: Goal: General experience of comfort will improve Outcome: Progressing   Problem: Safety: Goal: Ability to remain free from injury will improve Outcome: Progressing

## 2023-07-05 NOTE — Interval H&P Note (Signed)
History and Physical Interval Note:  07/05/2023 8:50 AM  Mark Porter  has presented today for surgery, with the diagnosis of right hip fracture.  The various methods of treatment have been discussed with the patient and family. After consideration of risks, benefits and other options for treatment, the patient has consented to  Procedure(s): PERCUTANEOUS FIXATION OF FEMORAL NECK (Right) as a surgical intervention.  The patient's history has been reviewed, patient examined, no change in status, stable for surgery.  I have reviewed the patient's chart and labs.  Questions were answered to the patient's satisfaction.     Sheral Apley

## 2023-07-05 NOTE — Plan of Care (Signed)

## 2023-07-05 NOTE — Transfer of Care (Signed)
Immediate Anesthesia Transfer of Care Note  Patient: Mark Porter  Procedure(s) Performed: PERCUTANEOUS FIXATION OF FEMORAL NECK (Right: Hip)  Patient Location: PACU  Anesthesia Type:General  Level of Consciousness: awake, alert , and oriented  Airway & Oxygen Therapy: Patient Spontanous Breathing and Patient connected to face mask oxygen  Post-op Assessment: Report given to RN and Post -op Vital signs reviewed and stable  Post vital signs: Reviewed and stable  Last Vitals:  Vitals Value Taken Time  BP 162/79   Temp    Pulse 86 07/05/23 1033  Resp 22 07/05/23 1034  SpO2 96 % 07/05/23 1033  Vitals shown include unfiled device data.  Last Pain:  Vitals:   07/05/23 0745  TempSrc:   PainSc: 6       Patients Stated Pain Goal: 2 (07/05/23 0715)  Complications: No notable events documented.
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
## 2023-07-05 NOTE — Op Note (Signed)
07/04/2023 - 07/05/2023  10:18 AM  PATIENT:  Mark Porter    PRE-OPERATIVE DIAGNOSIS:  right hip fracture  POST-OPERATIVE DIAGNOSIS:  Same  PROCEDURE:  PERCUTANEOUS FIXATION OF FEMORAL NECK  SURGEON:  Sheral Apley, MD  ASSISTANT: Levester Fresh, PA-C, she was present and scrubbed throughout the case, critical for completion in a timely fashion, and for retraction, instrumentation, and closure.   ANESTHESIA:   General  PREOPERATIVE INDICATIONS:  [name] is a  57 y.o. male who fell and was found to have a diagnosis of right hip fracture who elected for surgical management.    The risks benefits and alternatives were discussed with the patient preoperatively including but not limited to the risks of infection, bleeding, nerve injury, cardiopulmonary complications, blood clots, malunion, nonunion, avascular necrosis, the need for revision surgery, the potential for conversion to hemiarthroplasty, among others, and the patient was willing to proceed.  OPERATIVE IMPLANTS: 6.5 mm cannulated screws x3  OPERATIVE FINDINGS: appropirate fem neck fracture for the below treatment.   OPERATIVE PROCEDURE: The patient was brought to the operating room and placed in supine position. IV antibiotics were given. General anesthesia administered. Foley was also given. The patient was placed on the fracture table. The operative extremity was positioned, without any significant reduction maneuver and was prepped and draped in usual sterile fashion.  Time out was performed.  Small incisions were made distal to the greater trochanter, and 3 guidewires were introduced Into an inverted triangle configuration. The lengths were measured. The reduction was slightly valgus, and near-anatomic. I opened the cortex with a cannulated drill, and then placed the screws into position. Satisfactory fixation was achieved. I sequentially tightened the screws by hand.  I performed a live fluoroscopic exam and no  screw penetrance was noted. All threads crossed the fracture site.   The wounds were irrigated copiously, and repaired with Vicryl with Steri-Strips and sterile gauze. There no complications and the patient tolerated the procedure well.  The patient will be weightbearing as tolerated, VTE prophylaxis will be: chemical and mobilization

## 2023-07-05 NOTE — Anesthesia Preprocedure Evaluation (Signed)
Anesthesia Evaluation  Patient identified by MRN, date of birth, ID band Patient awake    Reviewed: Allergy & Precautions, H&P , NPO status , Patient's Chart, lab work & pertinent test results  Airway Mallampati: II   Neck ROM: full    Dental   Pulmonary neg pulmonary ROS   breath sounds clear to auscultation       Cardiovascular negative cardio ROS  Rhythm:regular Rate:Normal     Neuro/Psych  PSYCHIATRIC DISORDERS  Depression    Cervical spinal cord injury. Incomplete quadriplegia.    GI/Hepatic   Endo/Other    Renal/GU      Musculoskeletal   Abdominal   Peds  Hematology   Anesthesia Other Findings   Reproductive/Obstetrics                             Anesthesia Physical Anesthesia Plan  ASA: 3  Anesthesia Plan: General   Post-op Pain Management:    Induction: Intravenous  PONV Risk Score and Plan: 2 and Ondansetron, Dexamethasone, Midazolam and Treatment may vary due to age or medical condition  Airway Management Planned: Oral ETT  Additional Equipment:   Intra-op Plan:   Post-operative Plan: Extubation in OR  Informed Consent: I have reviewed the patients History and Physical, chart, labs and discussed the procedure including the risks, benefits and alternatives for the proposed anesthesia with the patient or authorized representative who has indicated his/her understanding and acceptance.     Dental advisory given  Plan Discussed with: CRNA, Anesthesiologist and Surgeon  Anesthesia Plan Comments:        Anesthesia Quick Evaluation

## 2023-07-06 DIAGNOSIS — S72001A Fracture of unspecified part of neck of right femur, initial encounter for closed fracture: Secondary | ICD-10-CM | POA: Diagnosis not present

## 2023-07-06 DIAGNOSIS — G8254 Quadriplegia, C5-C7 incomplete: Secondary | ICD-10-CM | POA: Diagnosis not present

## 2023-07-06 DIAGNOSIS — F333 Major depressive disorder, recurrent, severe with psychotic symptoms: Secondary | ICD-10-CM | POA: Diagnosis not present

## 2023-07-06 DIAGNOSIS — N390 Urinary tract infection, site not specified: Secondary | ICD-10-CM | POA: Diagnosis not present

## 2023-07-06 LAB — CBC
HCT: 35.5 % — ABNORMAL LOW (ref 39.0–52.0)
Hemoglobin: 11.9 g/dL — ABNORMAL LOW (ref 13.0–17.0)
MCH: 29.2 pg (ref 26.0–34.0)
MCHC: 33.5 g/dL (ref 30.0–36.0)
MCV: 87 fL (ref 80.0–100.0)
Platelets: 483 10*3/uL — ABNORMAL HIGH (ref 150–400)
RBC: 4.08 MIL/uL — ABNORMAL LOW (ref 4.22–5.81)
RDW: 13.7 % (ref 11.5–15.5)
WBC: 18.3 10*3/uL — ABNORMAL HIGH (ref 4.0–10.5)
nRBC: 0 % (ref 0.0–0.2)

## 2023-07-06 LAB — COMPREHENSIVE METABOLIC PANEL
ALT: 98 U/L — ABNORMAL HIGH (ref 0–44)
AST: 62 U/L — ABNORMAL HIGH (ref 15–41)
Albumin: 3.3 g/dL — ABNORMAL LOW (ref 3.5–5.0)
Alkaline Phosphatase: 123 U/L (ref 38–126)
Anion gap: 9 (ref 5–15)
BUN: 16 mg/dL (ref 6–20)
CO2: 23 mmol/L (ref 22–32)
Calcium: 8.4 mg/dL — ABNORMAL LOW (ref 8.9–10.3)
Chloride: 98 mmol/L (ref 98–111)
Creatinine, Ser: 0.91 mg/dL (ref 0.61–1.24)
GFR, Estimated: >60 mL/min (ref >60)
Glucose, Bld: 143 mg/dL — ABNORMAL HIGH (ref 70–99)
Potassium: 4.1 mmol/L (ref 3.5–5.1)
Sodium: 130 mmol/L — ABNORMAL LOW (ref 135–145)
Total Bilirubin: 0.5 mg/dL (ref <1.2)
Total Protein: 7.6 g/dL (ref 6.5–8.1)

## 2023-07-06 MED ORDER — POLYETHYLENE GLYCOL 3350 17 G PO PACK
17.0000 g | PACK | Freq: Every day | ORAL | Status: DC
  Administered 2023-07-07: 17 g via ORAL
  Filled 2023-07-06 (×2): qty 1
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
## 2023-07-06 NOTE — Plan of Care (Signed)
  Problem: Pain Management: Goal: General experience of comfort will improve Outcome: Progressing   Problem: Safety: Goal: Ability to remain free from injury will improve Outcome: Progressing

## 2023-07-06 NOTE — Progress Notes (Signed)
Orthopaedic Trauma Service (OTS)  1 Day Post-Op Procedure(s) (LRB): PERCUTANEOUS FIXATION OF FEMORAL NECK (Right)  Subjective: Patient reports pain as  mild now but can be moderate to severe with mobilization; was sleeping comfortably .    Objective: Current Vitals Blood pressure (!) 120/50, pulse 87, temperature (!) 97.5 F (36.4 C), temperature source Oral, resp. rate 15, height 5\' 7"  (1.702 m), weight 77.1 kg, SpO2 92%. Vital signs in last 24 hours: Temp:  [97.5 F (36.4 C)-98.6 F (37 C)] 97.5 F (36.4 C) (12/25 0546) Pulse Rate:  [76-95] 87 (12/25 0546) Resp:  [12-18] 15 (12/25 0546) BP: (96-162)/(50-84) 120/50 (12/25 0546) SpO2:  [91 %-100 %] 92 % (12/25 0546)  Intake/Output from previous day: 12/24 0701 - 12/25 0700 In: 960 [P.O.:360; I.V.:300; IV Piggyback:300] Out: 1200 [Urine:1200]  LABS Recent Labs    07/04/23 1706 07/05/23 0329 07/06/23 0328  HGB 13.2 12.2* 11.9*   Recent Labs    07/05/23 0329 07/06/23 0328  WBC 9.3 18.3*  RBC 4.33 4.08*  HCT 37.5* 35.5*  PLT 427* 483*   Recent Labs    07/05/23 0329 07/06/23 0328  NA 133* 130*  K 3.7 4.1  CL 101 98  CO2 24 23  BUN 11 16  CREATININE 0.71 0.91  GLUCOSE 111* 143*  CALCIUM 8.5* 8.4*   Recent Labs    07/04/23 1706 07/05/23 0329  INR 1.0 1.0     Physical Exam Alert and very pleasant RLE Dressing intact, clean, dry--pristine  Edema/ swelling controlled  Sens: DPN, SPN, TN intact  Motor: EHL, FHL, and lessor toe ext and flex all intact grossly (baseline)  Brisk cap refill, warm to touch, DP 2+  Assessment/Plan: 1 Day Post-Op Procedure(s) (LRB): PERCUTANEOUS FIXATION OF FEMORAL NECK (Right) 1. PT/OT  2. DVT proph Lovenox 3. F/u 8-14 days with Dr. Blima Ledger, MD [facility], [facility] [phone]
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
## 2023-07-07 ENCOUNTER — Encounter (HOSPITAL_COMMUNITY): Payer: Self-pay | Admitting: Orthopedic Surgery

## 2023-07-07 DIAGNOSIS — G8254 Quadriplegia, C5-C7 incomplete: Secondary | ICD-10-CM | POA: Diagnosis not present

## 2023-07-07 DIAGNOSIS — S72001A Fracture of unspecified part of neck of right femur, initial encounter for closed fracture: Secondary | ICD-10-CM | POA: Diagnosis not present

## 2023-07-07 DIAGNOSIS — N390 Urinary tract infection, site not specified: Secondary | ICD-10-CM | POA: Diagnosis not present

## 2023-07-07 LAB — COMPREHENSIVE METABOLIC PANEL
ALT: 64 U/L — ABNORMAL HIGH (ref 0–44)
AST: 40 U/L (ref 15–41)
Albumin: 3 g/dL — ABNORMAL LOW (ref 3.5–5.0)
Alkaline Phosphatase: 101 U/L (ref 38–126)
Anion gap: 4 — ABNORMAL LOW (ref 5–15)
BUN: 19 mg/dL (ref 6–20)
CO2: 23 mmol/L (ref 22–32)
Calcium: 8.1 mg/dL — ABNORMAL LOW (ref 8.9–10.3)
Chloride: 106 mmol/L (ref 98–111)
Creatinine, Ser: 0.87 mg/dL (ref 0.61–1.24)
GFR, Estimated: >60 mL/min (ref >60)
Glucose, Bld: 130 mg/dL — ABNORMAL HIGH (ref 70–99)
Potassium: 4.1 mmol/L (ref 3.5–5.1)
Sodium: 133 mmol/L — ABNORMAL LOW (ref 135–145)
Total Bilirubin: 0.4 mg/dL (ref <1.2)
Total Protein: 6.7 g/dL (ref 6.5–8.1)

## 2023-07-07 LAB — CBC
HCT: 34 % — ABNORMAL LOW (ref 39.0–52.0)
Hemoglobin: 11.1 g/dL — ABNORMAL LOW (ref 13.0–17.0)
MCH: 29 pg (ref 26.0–34.0)
MCHC: 32.6 g/dL (ref 30.0–36.0)
MCV: 88.8 fL (ref 80.0–100.0)
Platelets: 403 10*3/uL — ABNORMAL HIGH (ref 150–400)
RBC: 3.83 MIL/uL — ABNORMAL LOW (ref 4.22–5.81)
RDW: 14.2 % (ref 11.5–15.5)
WBC: 16.2 10*3/uL — ABNORMAL HIGH (ref 4.0–10.5)
nRBC: 0 % (ref 0.0–0.2)

## 2023-07-07 LAB — PROCALCITONIN: Procalcitonin: <0.1 ng/mL

## 2023-07-07 MED ORDER — POLYETHYLENE GLYCOL 3350 17 G PO PACK
17.0000 g | PACK | Freq: Two times a day (BID) | ORAL | Status: DC
  Administered 2023-07-07 – 2023-07-10 (×5): 17 g via ORAL
  Filled 2023-07-07 (×11): qty 1

## 2023-07-07 MED ORDER — OXYCODONE HCL 5 MG PO TABS
5.0000 mg | ORAL_TABLET | ORAL | Status: DC | PRN
  Administered 2023-07-07 – 2023-07-11 (×19): 5 mg via ORAL
  Filled 2023-07-07 (×20): qty 1

## 2023-07-07 MED ORDER — HYDROCODONE-ACETAMINOPHEN 10-325 MG PO TABS
1.0000 | ORAL_TABLET | Freq: Four times a day (QID) | ORAL | Status: DC | PRN
  Administered 2023-07-09 – 2023-07-13 (×11): 1 via ORAL
  Filled 2023-07-07 (×11): qty 1

## 2023-07-07 MED ORDER — LACTULOSE 10 GM/15ML PO SOLN: 20.0000 g | Freq: Two times a day (BID) | ORAL | Status: DC | PRN

## 2023-07-07 MED ORDER — OXYCODONE HCL 5 MG PO TABS
5.0000 mg | ORAL_TABLET | ORAL | Status: DC | PRN
  Administered 2023-07-07: 5 mg via ORAL
  Filled 2023-07-07: qty 1

## 2023-07-07 NOTE — Plan of Care (Signed)
  Problem: Nutrition: Goal: Adequate nutrition will be maintained 07/07/2023 0212 by Brett Albino, RN Outcome: Progressing 07/07/2023 0212 by Brett Albino, RN Outcome: Progressing   Problem: Coping: Goal: Level of anxiety will decrease 07/07/2023 0212 by Brett Albino, RN Outcome: Progressing 07/07/2023 0212 by Brett Albino, RN Outcome: Progressing   Problem: Elimination: Goal: Will not experience complications related to urinary retention Outcome: Progressing
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
## 2023-07-07 NOTE — Progress Notes (Signed)
Subjective: Patient reports pain as moderate. Waiting on pain medicine. Tolerating diet.  Urinating.   No CP, SOB.  Worked some with PT yesterday and says he wheeled himself down the hall to a vending machine.  Objective:   VITALS:   Vitals:   07/06/23 1035 07/06/23 1400 07/06/23 2239 07/07/23 0502  BP: 102/71 101/67 94/69 94/64   Pulse: 77 83 76 88  Resp: 18 18 15 15   Temp: (!) 97.5 F (36.4 C) 98 F (36.7 C) 98.3 F (36.8 C) 98 F (36.7 C)  TempSrc:   Oral Oral  SpO2: 92% 92% 94% 94%  Weight:      Height:          Latest Ref Rng & Units 07/07/2023    3:20 AM 07/06/2023    3:28 AM 07/05/2023    3:29 AM  CBC  WBC 4.0 - 10.5 K/uL 16.2  18.3  9.3   Hemoglobin 13.0 - 17.0 g/dL 91.4  78.2  95.6   Hematocrit 39.0 - 52.0 % 34.0  35.5  37.5   Platelets 150 - 400 K/uL 403  483  427       Latest Ref Rng & Units 07/07/2023    3:20 AM 07/06/2023    3:28 AM 07/05/2023    3:29 AM  BMP  Glucose 70 - 99 mg/dL 213  086  578   BUN 6 - 20 mg/dL 19  16  11    Creatinine 0.61 - 1.24 mg/dL 4.69  6.29  5.28   Sodium 135 - 145 mmol/L 133  130  133   Potassium 3.5 - 5.1 mmol/L 4.1  4.1  3.7   Chloride 98 - 111 mmol/L 106  98  101   CO2 22 - 32 mmol/L 23  23  24    Calcium 8.9 - 10.3 mg/dL 8.1  8.4  8.5    Intake/Output      12/25 0701 12/26 0700 12/26 0701 12/27 0700   P.O. 360    I.V. (mL/kg)     IV Piggyback     Total Intake(mL/kg) 360 (4.7)    Urine (mL/kg/hr) 500 (0.3)    Stool     Total Output 500    Net -140            Physical Exam: General: NAD.  Wide awake, listening to loud music in room, comfortable Resp: No increased wob Cardio: regular rate and rhythm ABD soft Neurologically intact MSK Neurovascularly intact Sensation intact distally Intact pulses distally Dorsiflexion/Plantar flexion intact Incision: dressing C/D/I Multiple sores R leg in various stages of healing  Assessment: 2 Days Post-Op  S/P Procedure(s) (LRB): PERCUTANEOUS FIXATION OF  FEMORAL NECK (Right) by Dr. Jewel Baize. Eulah Pont on 07/05/23  Principal Problem:   Femoral fracture (HCC) Active Problems:   C5-C7 incomplete quadriplegia (HCC)   Acute UTI   Major depressive disorder   Osteoporosis   Femoral neck fracture (HCC)   MDD (major depressive disorder), recurrent, severe, with psychosis (HCC)    Plan: Mobilize with therapy as able with WC  Advance diet Incentive Spirometry Elevate and Apply ice  Weightbearing: WBAT RLE but he is non-ambulatory at baseline due to partial paralysis Insicional and dressing care: Dressings left intact until follow-up and Reinforce dressings as needed Orthopedic device(s): None Showering: Keep dressing dry VTE prophylaxis: Lovenox 40mg  qd  while inpatient, can switch to ASA 81mg  bid x 30 days upon d/c , SCDs, ambulation Pain control: PRN, appears to be using a lot  of Dilaudid IV and Oxy 10mg , need to work on decreasing such frequent use of that Follow - up plan: 2 weeks post op in office Contact information:  Margarita Rana MD, Levester Fresh PA-C  Dispo: PT recommends Skilled Nursing Facility/Rehab which patient agrees to. Will await TOC and bed placement. Ready for d/c from an orthopedic standpoint whenever medically ready.      Jenne Pane, PA-C Office [phone] 07/07/2023, 7:11 AM
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
## 2023-07-08 ENCOUNTER — Other Ambulatory Visit (HOSPITAL_COMMUNITY): Payer: Self-pay

## 2023-07-08 ENCOUNTER — Other Ambulatory Visit: Payer: Self-pay

## 2023-07-08 DIAGNOSIS — M810 Age-related osteoporosis without current pathological fracture: Secondary | ICD-10-CM | POA: Diagnosis not present

## 2023-07-08 DIAGNOSIS — G8254 Quadriplegia, C5-C7 incomplete: Secondary | ICD-10-CM | POA: Diagnosis not present

## 2023-07-08 DIAGNOSIS — S72001A Fracture of unspecified part of neck of right femur, initial encounter for closed fracture: Secondary | ICD-10-CM | POA: Diagnosis not present

## 2023-07-08 DIAGNOSIS — N390 Urinary tract infection, site not specified: Secondary | ICD-10-CM | POA: Diagnosis not present

## 2023-07-08 DIAGNOSIS — F333 Major depressive disorder, recurrent, severe with psychotic symptoms: Secondary | ICD-10-CM

## 2023-07-08 LAB — CBC
HCT: 37.7 % — ABNORMAL LOW (ref 39.0–52.0)
Hemoglobin: 12.6 g/dL — ABNORMAL LOW (ref 13.0–17.0)
MCH: 29 pg (ref 26.0–34.0)
MCHC: 33.4 g/dL (ref 30.0–36.0)
MCV: 86.7 fL (ref 80.0–100.0)
Platelets: 475 10*3/uL — ABNORMAL HIGH (ref 150–400)
RBC: 4.35 MIL/uL (ref 4.22–5.81)
RDW: 14.6 % (ref 11.5–15.5)
WBC: 9.7 10*3/uL (ref 4.0–10.5)
nRBC: 0 % (ref 0.0–0.2)

## 2023-07-08 MED ORDER — MAGNESIUM CITRATE PO SOLN: 1.0000 | Freq: Every day | ORAL | Status: DC | PRN

## 2023-07-08 MED ORDER — ACETAMINOPHEN 500 MG PO TABS: ORAL_TABLET | ORAL | Status: AC

## 2023-07-08 MED ORDER — OXYCODONE HCL 5 MG PO TABS: 5.0000 mg | ORAL_TABLET | ORAL | 0 refills | Status: DC | PRN

## 2023-07-08 MED ORDER — PANTOPRAZOLE SODIUM 40 MG PO TBEC
40.0000 mg | DELAYED_RELEASE_TABLET | Freq: Every day | ORAL | 0 refills | Status: AC
  Filled 2023-07-08 (×2): qty 30, 30d supply, fill #0

## 2023-07-08 MED ORDER — MAGNESIUM CITRATE PO SOLN
1.0000 | Freq: Once | ORAL | Status: AC
  Administered 2023-07-08: 1 via ORAL
  Filled 2023-07-08: qty 296

## 2023-07-08 MED ORDER — CEFADROXIL 500 MG PO CAPS: 1000.0000 mg | ORAL_CAPSULE | Freq: Two times a day (BID) | ORAL | Status: AC

## 2023-07-08 MED ORDER — MAGNESIUM CITRATE PO SOLN: 1.0000 | Freq: Every day | ORAL | Status: AC | PRN

## 2023-07-08 MED ORDER — SENNOSIDES-DOCUSATE SODIUM 8.6-50 MG PO TABS: 2.0000 | ORAL_TABLET | Freq: Two times a day (BID) | ORAL | Status: AC

## 2023-07-08 MED ORDER — METHOCARBAMOL 500 MG PO TABS: 500.0000 mg | ORAL_TABLET | Freq: Three times a day (TID) | ORAL | Status: DC | PRN

## 2023-07-08 NOTE — Progress Notes (Signed)
Subjective: Patient reports pain as moderate. Tolerating diet.  Urinating.   No CP, SOB.  Continues to work with PT/OT as able in [facility]. Looking at chart it seems he is still relying way too much on Dilaudid IV. Getting it 5-6x/day along with Oxy.  Objective:   VITALS:   Vitals:   07/07/23 0502 07/07/23 1315 07/07/23 2122 07/08/23 0633  BP: 94/64 93/66 101/65 104/66  Pulse: 88 79 75 77  Resp: 15 16 16 20   Temp: 98 F (36.7 C) 98.2 F (36.8 C) 98 F (36.7 C) 98.2 F (36.8 C)  TempSrc: Oral Oral Oral Oral  SpO2: 94% 93% 96% 96%  Weight:      Height:          Latest Ref Rng & Units 07/08/2023    3:22 AM 07/07/2023    3:20 AM 07/06/2023    3:28 AM  CBC  WBC 4.0 - 10.5 K/uL 9.7  16.2  18.3   Hemoglobin 13.0 - 17.0 g/dL 16.1  09.6  04.5   Hematocrit 39.0 - 52.0 % 37.7  34.0  35.5   Platelets 150 - 400 K/uL 475  403  483       Latest Ref Rng & Units 07/07/2023    3:20 AM 07/06/2023    3:28 AM 07/05/2023    3:29 AM  BMP  Glucose 70 - 99 mg/dL 409  811  914   BUN 6 - 20 mg/dL 19  16  11    Creatinine 0.61 - 1.24 mg/dL 7.82  9.56  2.13   Sodium 135 - 145 mmol/L 133  130  133   Potassium 3.5 - 5.1 mmol/L 4.1  4.1  3.7   Chloride 98 - 111 mmol/L 106  98  101   CO2 22 - 32 mmol/L 23  23  24    Calcium 8.9 - 10.3 mg/dL 8.1  8.4  8.5    Intake/Output      12/26 0701 12/27 0700 12/27 0701 12/28 0700   P.O. 1560    Total Intake(mL/kg) 1560 (20.2)    Urine (mL/kg/hr) 2100 (1.1)    Stool 0    Total Output 2100    Net -540         Urine Occurrence 1 x    Stool Occurrence 0 x       Physical Exam: General: NAD.  Calm, comfortable Resp: No increased wob Cardio: regular rate and rhythm ABD soft Neurologically intact MSK Neurovascularly intact Sensation intact distally Intact pulses distally Dorsiflexion/Plantar flexion intact Incision: dressing C/D/I Multiple sores R leg in various stages of healing  Assessment: 3 Days Post-Op  S/P Procedure(s)  (LRB): PERCUTANEOUS FIXATION OF FEMORAL NECK (Right) by Dr. Jewel Baize. Eulah Pont on 07/05/23  Principal Problem:   Femoral fracture (HCC) Active Problems:   C5-C7 incomplete quadriplegia (HCC)   Acute UTI   Major depressive disorder   Osteoporosis   Femoral neck fracture (HCC)   MDD (major depressive disorder), recurrent, severe, with psychosis (HCC)    Plan: Mobilize with therapy as able with WC  Advance diet Incentive Spirometry Elevate and Apply ice  Weightbearing: WBAT RLE but he is non-ambulatory at baseline due to partial paralysis Insicional and dressing care: Dressings left intact until follow-up and Reinforce dressings as needed Orthopedic device(s): None Showering: Keep dressing dry VTE prophylaxis: Lovenox 40mg  qd  while inpatient, can switch to ASA 81mg  bid x 30 days upon d/c , SCDs, ambulation Pain control: PRN, appears to be  using a lot of Dilaudid IV and Oxy, need to work on continuing to decrease such frequent use of those especially off the IV medicine Follow - up plan: 2 weeks post op in office Contact information:  Margarita Rana MD, Levester Fresh PA-C  Dispo: PT recommends Skilled Nursing Facility/Rehab which patient agrees to. Will await TOC and bed placement. Ready for d/c from an orthopedic standpoint whenever medically ready.   Recommend d/c on ASA 81mg  bid x 30 days for DVT prophylaxis.   Jenne Pane, PA-C Office [phone] 07/08/2023, 1:40 PM

## 2023-07-08 NOTE — TOC Initial Note (Signed)
Transition of Care [facility]) - Initial/Assessment Note    Patient Details  Name: Mark Porter MRN: [id number] Date of Birth: [birth year]  Transition of Care [facility]) CM/SW Contact:    Adrian Prows, RN Phone Number: 07/08/2023, 1:08 PM  Clinical Narrative:                 TOC for d/c planning; spoke w/ pt in room; pt says he has been living w/ different friends until he can find his own place; he identified POC brother Nelson Chimes [phone]); pt says he will need help w/ transportation; he verified he has insurance/PCP; pt says he has money for food and housing when it becomes available; pt agrees to recc SNF; he would like the bed search limited to [facility]; pt says he does not have glasses, dentures, or hearing aids; he has a wheel chair; pt says he does not have problems w/ his skin; he is able to feed himself; pt says he is continent of bowel and bladder; PASRR # [id number]; faxed out for bed offers; ins auth needed.   Expected Discharge Plan: Skilled Nursing Facility Barriers to Discharge: No Barriers Identified   Patient Goals and CMS Choice Patient states their goals for this hospitalization and ongoing recovery are:: SNF CMS Medicare.gov Compare Post Acute Care list provided to:: Patient Choice offered to / list presented to : Patient [place] ownership interest in [facility].provided to:: Patient    Expected Discharge Plan and Services   Discharge Planning Services: CM Consult Post Acute Care Choice: Skilled Nursing Facility Living arrangements for the past 2 months: No permanent address (living with different friends) Expected Discharge Date: 07/08/23                                    Prior Living Arrangements/Services Living arrangements for the past 2 months: No permanent address (living with different friends) Lives with:: Friends Patient language and need for interpreter reviewed:: Yes Do you feel safe going back to the  place where you live?: Yes      Need for Family Participation in Patient Care: Yes (Comment) Care giver support system in place?: Yes (comment) Current home services: DME (wheelchair) Criminal Activity/Legal Involvement Pertinent to Current Situation/Hospitalization: No - Comment as needed  Activities of Daily Living   ADL Screening (condition at time of admission) Independently performs ADLs?: Yes (appropriate for developmental age) Is the patient deaf or have difficulty hearing?: No Does the patient have difficulty seeing, even when wearing glasses/contacts?: No Does the patient have difficulty concentrating, remembering, or making decisions?: No  Permission Sought/Granted Permission sought to share information with : Case Manager Permission granted to share information with : Yes, Verbal Permission Granted  Share Information with NAME: Case Manager     Permission granted to share info w Relationship: Nelson Chimes (brother) [phone]     Emotional Assessment Appearance:: Appears stated age Attitude/Demeanor/Rapport: Gracious Affect (typically observed): Accepting Orientation: : Oriented to Self, Oriented to Place, Oriented to  Time, Oriented to Situation Alcohol / Substance Use: Not Applicable Psych Involvement: No (comment)  Admission diagnosis:  Femoral fracture (HCC) [S72.90XA] Closed fracture of neck of right femur, initial encounter (HCC) [S72.001A] Patient Active Problem List   Diagnosis Date Noted   MDD (major depressive disorder), recurrent, severe, with psychosis (HCC) 07/05/2023   Osteoporosis 07/04/2023   Femoral neck fracture (HCC) 07/04/2023   Femoral fracture (HCC) 07/04/2023  Major depressive disorder, recurrent episode, severe (HCC) 06/23/2023   Cocaine dependence with hallucinations (HCC) 06/23/2023   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 [place] [address] Phone: [phone] Fax: [phone]  [name] [address] [place] [address] Phone: [phone] Fax: [phone]     Social Drivers of Health (SDOH) Social History: SDOH Screenings   Food Insecurity: No Food Insecurity (07/08/2023)  Recent Concern: Food Insecurity - Food Insecurity Present (07/04/2023)  Housing: High Risk (07/08/2023)  Transportation Needs: No Transportation Needs (07/08/2023)  Recent Concern: Transportation Needs - Unmet Transportation Needs (07/04/2023)  Utilities: Not At Risk (07/08/2023)  Recent Concern: Utilities - At Risk (07/04/2023)  Alcohol Screen: Low Risk  (06/22/2023)  Social Connections: Unknown (11/23/2021)   [facility] from [facility], [facility]  Tobacco Use: High Risk (07/04/2023)   SDOH Interventions: Food Insecurity Interventions: Intervention Not Indicated, Inpatient TOC Housing Interventions: Intervention Not Indicated, Inpatient TOC Transportation Interventions: Intervention Not Indicated, Inpatient TOC Utilities Interventions: Intervention Not Indicated, Inpatient TOC   Readmission Risk Interventions    07/08/2023    1:05 PM 07/06/2023   10:14 AM 02/24/2023   11:58 AM  Readmission Risk Prevention Plan  Post Dischage Appt   Complete  Medication Screening   Complete  Transportation Screening Complete Complete Complete  Medication Review [employer]) Complete Complete   PCP or  Specialist appointment within 3-5 days of discharge Complete    HRI or Home Care Consult Complete Complete   SW Recovery Care/Counseling Consult Complete Complete   Palliative Care Screening Not Applicable Not Applicable   Skilled Nursing Facility Complete Complete

## 2023-07-08 NOTE — [place] FL2 (Signed)
[place] MEDICAID FL2 LEVEL OF CARE FORM     IDENTIFICATION  Patient Name: Mark Porter Birthdate: 01/04/1966 Sex: male Admission Date (Current Location): 07/04/2023  [place] Number:  Mark Porter [id number] M Facility and Address:  [facility],  [address], [place] [address]      Provider Number: [id number]  Attending Physician Name and Address:  Almon Hercules, MD  Relative Name and Phone Number:  Mark Porter (brother) [phone]    Current Level of Care: Hospital Recommended Level of Care: Skilled Nursing Facility Prior Approval Number:    Date Approved/Denied:   PASRR Number: [id number]  Discharge Plan: SNF    Current Diagnoses: Patient Active Problem List   Diagnosis Date Noted   MDD (major depressive disorder), recurrent, severe, with psychosis (HCC) 07/05/2023   Osteoporosis 07/04/2023   Femoral neck fracture (HCC) 07/04/2023   Femoral fracture (HCC) 07/04/2023   Major depressive disorder, recurrent episode, severe (HCC) 06/23/2023   Cocaine dependence with hallucinations (HCC) 06/23/2023   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) 06/22/2023   Major depressive disorder 05/30/2023   Cocaine abuse (HCC) 05/28/2023   Acute urinary retention 05/28/2023   Prostate abscess 05/27/2023   Acute UTI 05/26/2023   Suicidal ideation 05/26/2023   Opiate overdose (HCC) 02/24/2023   Polysubstance abuse (HCC) 02/24/2023   Homeless 02/24/2023   Hypotension 02/14/2023   Altered mental status    Agitation    Encephalopathy acute 05/24/2020   C5-C7 incomplete quadriplegia (HCC) 09/10/2016    Orientation RESPIRATION BLADDER Height & Weight     Self, Time, Situation  Normal Continent Weight: 77.1 kg Height:  5\' 7"  (170.2 cm)  BEHAVIORAL SYMPTOMS/MOOD NEUROLOGICAL BOWEL NUTRITION STATUS      Continent Diet (regular diet)  AMBULATORY STATUS COMMUNICATION OF NEEDS Skin   Extensive Assist (chronic incomplete C5-C7  quadraplegia) Verbally Other (Comment) (right hip incision; scratches to bil LEs)                       Personal Care Assistance Level of Assistance  Bathing, Feeding, Dressing Bathing Assistance: Limited assistance Feeding assistance: Independent Dressing Assistance: Limited assistance     Functional Limitations Info  Sight, Hearing, Speech Sight Info: Adequate Hearing Info: Adequate Speech Info: Adequate    SPECIAL CARE FACTORS FREQUENCY  PT (By licensed PT), OT (By licensed OT)     PT Frequency: 5x/week OT Frequency: 5x/week            Contractures Contractures Info: Not present    Additional Factors Info  Code Status, Allergies, Psychotropic Code Status Info: Full Code Allergies Info: Carisoprodol Psychotropic Info: See MAR         Current Medications (07/08/2023):  This is the current hospital active medication list Current Facility-Administered Medications  Medication Dose Route Frequency Provider Last Rate Last Admin   acetaminophen (TYLENOL) tablet 325-650 mg  325-650 mg Oral Q6H PRN Aleen Campi, Meghan M, PA-C       alum & mag hydroxide-simeth (MAALOX/MYLANTA) 200-200-20 MG/5ML suspension 30 mL  30 mL Oral Q4H PRN Levester Fresh M, PA-C   30 mL at 07/07/23 0536   ARIPiprazole (ABILIFY) tablet 10 mg  10 mg Oral QHS Levester Fresh M, PA-C   10 mg at 07/07/23 2051   celecoxib (CELEBREX) capsule 200 mg  200 mg Oral BID Levester Fresh M, PA-C   200 mg at 07/08/23 0952   enoxaparin (LOVENOX) injection 40 mg  40 mg Subcutaneous QHS Levester Fresh M,  PA-C   40 mg at 07/07/23 2253   HYDROcodone-acetaminophen (NORCO) 10-325 MG per tablet 1 tablet  1 tablet Oral Q6H PRN Levester Fresh M, PA-C       HYDROmorphone (DILAUDID) injection 0.5-1 mg  0.5-1 mg Intravenous Q4H PRN Levester Fresh M, PA-C   0.5 mg at 07/08/23 1202   magnesium citrate solution 1 Bottle  1 Bottle Oral Daily PRN Almon Hercules, MD       menthol-cetylpyridinium (CEPACOL) lozenge 3 mg  1 lozenge Oral PRN Gawne,  Meghan M, PA-C       Or   phenol (CHLORASEPTIC) mouth spray 1 spray  1 spray Mouth/Throat PRN Gawne, Lindie Spruce M, PA-C       methocarbamol (ROBAXIN) tablet 500 mg  500 mg Oral Q6H PRN Levester Fresh M, PA-C   500 mg at 07/08/23 0507   Or   methocarbamol (ROBAXIN) injection 500 mg  500 mg Intravenous Q6H PRN Gawne, Meghan M, PA-C       metoCLOPramide (REGLAN) tablet 5-10 mg  5-10 mg Oral Q8H PRN Gawne, Meghan M, PA-C       Or   metoCLOPramide (REGLAN) injection 5-10 mg  5-10 mg Intravenous Q8H PRN Gawne, Meghan M, PA-C       ondansetron (ZOFRAN) tablet 4 mg  4 mg Oral Q6H PRN Gawne, Meghan M, PA-C       Or   ondansetron [facility]) injection 4 mg  4 mg Intravenous Q6H PRN Gawne, Meghan M, PA-C       oxyCODONE (Oxy IR/ROXICODONE) immediate release tablet 5 mg  5 mg Oral Q4H PRN Levester Fresh M, PA-C   5 mg at 07/08/23 0949   pantoprazole (PROTONIX) EC tablet 40 mg  40 mg Oral Daily Levester Fresh M, PA-C   40 mg at 07/08/23 0951   polyethylene glycol (MIRALAX / GLYCOLAX) packet 17 g  17 g Oral BID Rai, Ripudeep K, MD   17 g at 07/07/23 2251   senna-docusate (Senokot-S) tablet 2 tablet  2 tablet Oral BID Jenne Pane, PA-C   2 tablet at 07/08/23 4782   sodium chloride flush (NS) 0.9 % injection 3 mL  3 mL Intravenous Q12H Levester Fresh M, PA-C   3 mL at 07/08/23 1000   sulfamethoxazole-trimethoprim (BACTRIM DS) 800-160 MG per tablet 1 tablet  1 tablet Oral Q12H Levester Fresh M, PA-C   1 tablet at 07/08/23 1058   traZODone (DESYREL) tablet 50 mg  50 mg Oral QHS PRN Levester Fresh M, PA-C   50 mg at 07/06/23 2340   venlafaxine XR (EFFEXOR-XR) 24 hr capsule 75 mg  75 mg Oral Q breakfast Levester Fresh M, PA-C   75 mg at 07/08/23 9562     Discharge Medications: Please see discharge summary for a list of discharge medications.  Relevant Imaging Results:  Relevant Lab Results:   Additional Information SSN: [id number]  Mark Prows, RN
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
## 2023-07-09 ENCOUNTER — Other Ambulatory Visit (HOSPITAL_COMMUNITY): Payer: Self-pay

## 2023-07-09 DIAGNOSIS — S72001A Fracture of unspecified part of neck of right femur, initial encounter for closed fracture: Secondary | ICD-10-CM | POA: Diagnosis not present

## 2023-07-09 DIAGNOSIS — M810 Age-related osteoporosis without current pathological fracture: Secondary | ICD-10-CM | POA: Diagnosis not present

## 2023-07-09 DIAGNOSIS — G8254 Quadriplegia, C5-C7 incomplete: Secondary | ICD-10-CM | POA: Diagnosis not present

## 2023-07-09 DIAGNOSIS — N390 Urinary tract infection, site not specified: Secondary | ICD-10-CM | POA: Diagnosis not present

## 2023-07-09 LAB — CBC
HCT: 37.5 % — ABNORMAL LOW (ref 39.0–52.0)
Hemoglobin: 12.6 g/dL — ABNORMAL LOW (ref 13.0–17.0)
MCH: 29.2 pg (ref 26.0–34.0)
MCHC: 33.6 g/dL (ref 30.0–36.0)
MCV: 86.8 fL (ref 80.0–100.0)
Platelets: 450 10*3/uL — ABNORMAL HIGH (ref 150–400)
RBC: 4.32 MIL/uL (ref 4.22–5.81)
RDW: 14.4 % (ref 11.5–15.5)
WBC: 8.4 10*3/uL (ref 4.0–10.5)
nRBC: 0 % (ref 0.0–0.2)

## 2023-07-09 LAB — RENAL FUNCTION PANEL
Albumin: 3 g/dL — ABNORMAL LOW (ref 3.5–5.0)
Anion gap: 8 (ref 5–15)
BUN: 15 mg/dL (ref 6–20)
CO2: 21 mmol/L — ABNORMAL LOW (ref 22–32)
Calcium: 8.2 mg/dL — ABNORMAL LOW (ref 8.9–10.3)
Chloride: 104 mmol/L (ref 98–111)
Creatinine, Ser: 0.67 mg/dL (ref 0.61–1.24)
GFR, Estimated: >60 mL/min (ref >60)
Glucose, Bld: 139 mg/dL — ABNORMAL HIGH (ref 70–99)
Phosphorus: 4.5 mg/dL (ref 2.5–4.6)
Potassium: 3.8 mmol/L (ref 3.5–5.1)
Sodium: 133 mmol/L — ABNORMAL LOW (ref 135–145)

## 2023-07-09 LAB — MAGNESIUM: Magnesium: 2 mg/dL (ref 1.7–2.4)

## 2023-07-09 LAB — TSH: TSH: 12.331 u[IU]/mL — ABNORMAL HIGH (ref 0.350–4.500)

## 2023-07-09 LAB — T4, FREE: Free T4: 0.79 ng/dL (ref 0.61–1.12)

## 2023-07-09 MED ORDER — HYDROMORPHONE HCL 1 MG/ML IJ SOLN
0.5000 mg | INTRAMUSCULAR | Status: DC | PRN
  Administered 2023-07-09: 0.5 mg via INTRAVENOUS
  Filled 2023-07-09: qty 0.5

## 2023-07-09 MED ORDER — ASPIRIN 81 MG PO TBEC: 81.0000 mg | DELAYED_RELEASE_TABLET | Freq: Two times a day (BID) | ORAL | 0 refills | Status: DC

## 2023-07-09 NOTE — Progress Notes (Signed)
PT Cancellation Note  Patient Details Name: Mark Porter MRN: [id number] DOB: 1966-06-09   Cancelled Treatment:     PT attempted x 3 this date but deferred on am attempts at pt request 2* fatigue.  Deferred this pm with pt sleeping soundly.  RN aware.  Will follow.   Gale Klar 07/09/2023, 3:57 PM
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
## 2023-07-09 NOTE — TOC Progression Note (Signed)
Transition of Care [facility]) - Progression Note    Patient Details  Name: Mark Porter MRN: [id number] Date of Birth: [birth year]  Transition of Care [facility] CM/SW Contact  Carmina Miller, LCSWA Phone Number: 07/09/2023, 11:00 AM  Clinical Narrative:     CSW spoke with pt, provided bed offer, pt agreeable. CSW reached out to [facility] AD Kia, advised pt would like to accept bed offer, waiting on confirmation. CSW spoke with Talbot Grumbling, started insurance auth request (with a start date of 07/11/23), ref # [id number], clinicals faxed to [facility].   Expected Discharge Plan: Skilled Nursing Facility Barriers to Discharge: No Barriers Identified  Expected Discharge Plan and Services   Discharge Planning Services: CM Consult Post Acute Care Choice: Skilled Nursing Facility Living arrangements for the past 2 months: No permanent address (living with different friends) Expected Discharge Date: 07/08/23                                     Social Determinants of Health (SDOH) Interventions SDOH Screenings   Food Insecurity: No Food Insecurity (07/08/2023)  Recent Concern: Food Insecurity - Food Insecurity Present (07/04/2023)  Housing: High Risk (07/08/2023)  Transportation Needs: Unmet Transportation Needs (07/08/2023)  Utilities: Not At Risk (07/08/2023)  Recent Concern: Utilities - At Risk (07/04/2023)  Alcohol Screen: Low Risk  (06/22/2023)  Social Connections: Unknown (11/23/2021)   [facility] from [facility]  Tobacco Use: High Risk (07/04/2023)    Readmission Risk Interventions    07/08/2023    1:05 PM 07/06/2023   10:14 AM 02/24/2023   11:58 AM  Readmission Risk Prevention Plan  Post Dischage Appt   Complete  Medication Screening   Complete  Transportation Screening Complete Complete Complete  Medication Review Oceanographer) Complete Complete   PCP or Specialist appointment within 3-5 days of discharge Complete    HRI or Home Care Consult Complete Complete    SW Recovery Care/Counseling Consult Complete Complete   Palliative Care Screening Not Applicable Not Applicable   Skilled Nursing Facility Complete Complete

## 2023-07-09 NOTE — Progress Notes (Signed)
Subjective: Patient reports pain as moderate. Tolerating diet.  Urinating.   No CP, SOB.  Continues to work with PT/OT as able in [facility]. Looking at chart it seems he is still relying way too much on Dilaudid IV. Getting it 6x/day along with Oxy 4-5x/day. Has to be off IV narcotics before d/c.  Objective:   VITALS:   Vitals:   07/08/23 0633 07/08/23 1349 07/08/23 2222 07/09/23 0612  BP: 104/66 105/72 102/72 100/64  Pulse: 77 76 81 69  Resp: 20 18 18 18   Temp: 98.2 F (36.8 C) 98.1 F (36.7 C) 97.6 F (36.4 C) (!) 97.5 F (36.4 C)  TempSrc: Oral Oral    SpO2: 96% 97% 96% 93%  Weight:      Height:          Latest Ref Rng & Units 07/09/2023    3:43 AM 07/08/2023    3:22 AM 07/07/2023    3:20 AM  CBC  WBC 4.0 - 10.5 K/uL 8.4  9.7  16.2   Hemoglobin 13.0 - 17.0 g/dL 91.4  78.2  95.6   Hematocrit 39.0 - 52.0 % 37.5  37.7  34.0   Platelets 150 - 400 K/uL 450  475  403       Latest Ref Rng & Units 07/09/2023    3:43 AM 07/07/2023    3:20 AM 07/06/2023    3:28 AM  BMP  Glucose 70 - 99 mg/dL 213  086  578   BUN 6 - 20 mg/dL 15  19  16    Creatinine 0.61 - 1.24 mg/dL 4.69  6.29  5.28   Sodium 135 - 145 mmol/L 133  133  130   Potassium 3.5 - 5.1 mmol/L 3.8  4.1  4.1   Chloride 98 - 111 mmol/L 104  106  98   CO2 22 - 32 mmol/L 21  23  23    Calcium 8.9 - 10.3 mg/dL 8.2  8.1  8.4    Intake/Output      12/27 0701 12/28 0700 12/28 0701 12/29 0700   P.O. 380 240   Total Intake(mL/kg) 380 (4.9) 240 (3.1)   Urine (mL/kg/hr) 1350 (0.7)    Stool     Total Output 1350    Net -970 +240           Physical Exam: General: NAD.  Calm, comfortable Resp: No increased wob Cardio: regular rate and rhythm ABD soft Neurologically intact MSK Neurovascularly intact Sensation intact distally Intact pulses distally Dorsiflexion/Plantar flexion intact Incision: dressing C/D/I Multiple sores R leg in various stages of healing  Assessment: 4 Days Post-Op  S/P Procedure(s)  (LRB): PERCUTANEOUS FIXATION OF FEMORAL NECK (Right) by Dr. Jewel Baize. Eulah Pont on 07/05/23  Principal Problem:   Femoral fracture (HCC) Active Problems:   C5-C7 incomplete quadriplegia (HCC)   Acute UTI   Major depressive disorder   Osteoporosis   Femoral neck fracture (HCC)   MDD (major depressive disorder), recurrent, severe, with psychosis (HCC)    Plan: Decrease overuse/abuse of available narcotics Mobilize with therapy as able with WC  Advance diet Incentive Spirometry Elevate and Apply ice  Weightbearing: WBAT RLE but he is non-ambulatory at baseline due to partial paralysis Insicional and dressing care: Dressings left intact until follow-up and Reinforce dressings as needed Orthopedic device(s): None Showering: Keep dressing dry VTE prophylaxis: Lovenox 40mg  qd  while inpatient, can switch to ASA 81mg  bid x 30 days upon d/c , SCDs, ambulation Pain control: PRN, discontinued  the IV Dilaudid as I feel he is abusing the pain medicines and taking advantage of the availability of them since in hospital, has substance abuse history, needs to be off IV pain meds before d/c anyway Follow - up plan: 2 weeks post op in office Contact information:  Margarita Rana MD, Levester Fresh PA-C  Dispo: PT recommends Skilled Nursing Facility/Rehab which patient agrees to. Will await TOC and bed placement. Ready for d/c from an orthopedic standpoint whenever medically ready.   Recommend d/c on ASA 81mg  bid x 30 days for DVT prophylaxis.   Jenne Pane, PA-C Office [phone] 07/09/2023, 10:18 AM

## 2023-07-10 DIAGNOSIS — G8254 Quadriplegia, C5-C7 incomplete: Secondary | ICD-10-CM | POA: Diagnosis not present

## 2023-07-10 DIAGNOSIS — S72001A Fracture of unspecified part of neck of right femur, initial encounter for closed fracture: Secondary | ICD-10-CM | POA: Diagnosis not present

## 2023-07-10 DIAGNOSIS — M810 Age-related osteoporosis without current pathological fracture: Secondary | ICD-10-CM | POA: Diagnosis not present

## 2023-07-10 DIAGNOSIS — N390 Urinary tract infection, site not specified: Secondary | ICD-10-CM | POA: Diagnosis not present
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
## 2023-07-10 NOTE — TOC Progression Note (Addendum)
Transition of Care [facility]) - Progression Note    Patient Details  Name: Mark Porter MRN: [id number] Date of Birth: March 26, 1966  [facility] of [facility]) CM/SW Contact  Darleene Cleaver, [place] Phone Number: 07/10/2023, 1:20 PM  Clinical Narrative:     Insurance authorization is still pending.  CSW to continue to follow patient's progress throughout discharge planning.  CSW sent insurance company updated clinical notes.   Expected Discharge Plan: Skilled Nursing Facility Barriers to Discharge: No Barriers Identified  Expected Discharge Plan and Services   Discharge Planning Services: CM Consult Post Acute Care Choice: Skilled Nursing Facility Living arrangements for the past 2 months: No permanent address (living with different friends) Expected Discharge Date: 07/08/23                                     Social Determinants of Health (SDOH) Interventions SDOH Screenings   Food Insecurity: No Food Insecurity (07/08/2023)  Recent Concern: Food Insecurity - Food Insecurity Present (07/04/2023)  Housing: High Risk (07/08/2023)  Transportation Needs: Unmet Transportation Needs (07/08/2023)  Utilities: Not At Risk (07/08/2023)  Recent Concern: Utilities - At Risk (07/04/2023)  Alcohol Screen: Low Risk  (06/22/2023)  Social Connections: Unknown (11/23/2021)   Received from [facility]  Tobacco Use: High Risk (07/04/2023)    Readmission Risk Interventions    07/08/2023    1:05 PM 07/06/2023   10:14 AM 02/24/2023   11:58 AM  Readmission Risk Prevention Plan  Post Dischage Appt   Complete  Medication Screening   Complete  Transportation Screening Complete Complete Complete  Medication Review Oceanographer) Complete Complete   PCP or Specialist appointment within 3-5 days of discharge Complete    HRI or Home Care Consult Complete Complete   SW Recovery Care/Counseling Consult Complete Complete   Palliative Care Screening Not Applicable Not  Applicable   Skilled Nursing Facility Complete Complete

## 2023-07-11 ENCOUNTER — Other Ambulatory Visit (HOSPITAL_COMMUNITY): Payer: Self-pay

## 2023-07-11 DIAGNOSIS — N390 Urinary tract infection, site not specified: Secondary | ICD-10-CM | POA: Diagnosis not present

## 2023-07-11 DIAGNOSIS — S72001A Fracture of unspecified part of neck of right femur, initial encounter for closed fracture: Secondary | ICD-10-CM | POA: Diagnosis not present

## 2023-07-11 DIAGNOSIS — G8254 Quadriplegia, C5-C7 incomplete: Secondary | ICD-10-CM | POA: Diagnosis not present

## 2023-07-11 DIAGNOSIS — M810 Age-related osteoporosis without current pathological fracture: Secondary | ICD-10-CM | POA: Diagnosis not present

## 2023-07-11 MED ORDER — OXYCODONE HCL 5 MG PO TABS
5.0000 mg | ORAL_TABLET | Freq: Four times a day (QID) | ORAL | Status: DC | PRN
  Administered 2023-07-11 – 2023-07-13 (×8): 5 mg via ORAL
  Filled 2023-07-11 (×7): qty 1

## 2023-07-11 NOTE — TOC Progression Note (Addendum)
Transition of Care [facility]) - Progression Note    Patient Details  Name: Mark Porter MRN: [id number] Date of Birth: 28-Jan-1966  Transition of [facility]) CM/SW Contact  Amada Jupiter, LCSW Phone Number: 07/11/2023, 4:00 PM  Clinical Narrative:    SNF insurance authorization still pending decision.  Pt and MD aware.   Expected Discharge Plan: Skilled Nursing Facility Barriers to Discharge: No Barriers Identified  Expected Discharge Plan and Services   Discharge Planning Services: CM Consult Post Acute Care Choice: Skilled Nursing Facility Living arrangements for the past 2 months: No permanent address (living with different friends) Expected Discharge Date: 07/11/23                                     Social Determinants of Health (SDOH) Interventions SDOH Screenings   Food Insecurity: No Food Insecurity (07/08/2023)  Recent Concern: Food Insecurity - Food Insecurity Present (07/04/2023)  Housing: High Risk (07/08/2023)  Transportation Needs: Unmet Transportation Needs (07/08/2023)  Utilities: Not At Risk (07/08/2023)  Recent Concern: Utilities - At Risk (07/04/2023)  Alcohol Screen: Low Risk  (06/22/2023)  Social Connections: Unknown (11/23/2021)   [facility] from [facility]  Tobacco Use: High Risk (07/04/2023)    Readmission Risk Interventions    07/08/2023    1:05 PM 07/06/2023   10:14 AM 02/24/2023   11:58 AM  Readmission Risk Prevention Plan  Post Dischage Appt   Complete  Medication Screening   Complete  Transportation Screening Complete Complete Complete  Medication Review Oceanographer) Complete Complete   PCP or Specialist appointment within 3-5 days of discharge Complete    HRI or Home Care Consult Complete Complete   SW Recovery Care/Counseling Consult Complete Complete   Palliative Care Screening Not Applicable Not Applicable   Skilled Nursing Facility Complete Complete
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
## 2023-07-12 ENCOUNTER — Other Ambulatory Visit (HOSPITAL_COMMUNITY): Payer: Self-pay

## 2023-07-12 DIAGNOSIS — N390 Urinary tract infection, site not specified: Secondary | ICD-10-CM | POA: Diagnosis not present

## 2023-07-12 DIAGNOSIS — M810 Age-related osteoporosis without current pathological fracture: Secondary | ICD-10-CM | POA: Diagnosis not present

## 2023-07-12 DIAGNOSIS — S72001A Fracture of unspecified part of neck of right femur, initial encounter for closed fracture: Secondary | ICD-10-CM | POA: Diagnosis not present

## 2023-07-12 DIAGNOSIS — G8254 Quadriplegia, C5-C7 incomplete: Secondary | ICD-10-CM | POA: Diagnosis not present

## 2023-07-12 LAB — CULTURE, BLOOD (ROUTINE X 2)
Culture: NO GROWTH
Culture: NO GROWTH
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
## 2023-07-12 NOTE — Plan of Care (Signed)
  Problem: Activity: Goal: Risk for activity intolerance will decrease Outcome: Progressing   Problem: Pain Management: Goal: General experience of comfort will improve Outcome: Progressing   Problem: Safety: Goal: Ability to remain free from injury will improve Outcome: Progressing

## 2023-07-12 NOTE — TOC Progression Note (Signed)
 Transition of Care [facility]) - Progression Note    Patient Details  Name: Mark Porter MRN: [id number] Date of Birth: [birth year]  [facility]) CM/SW Contact  NORMAN ASPEN, LCSW Phone Number: 07/12/2023, 3:55 PM  Clinical Narrative:     Have received insurance authorization for SNF (ref# [id number]) and bed accepted at [facility] who can admit pt tomorrow.  Pt and MD aware and agreeable.    Expected Discharge Plan: Skilled Nursing Facility Barriers to Discharge: No Barriers Identified  Expected Discharge Plan and Services   Discharge Planning Services: CM Consult Post Acute Care Choice: Skilled Nursing Facility Living arrangements for the past 2 months: No permanent address (living with different friends) Expected Discharge Date: 07/12/23                                     Social Determinants of Health (SDOH) Interventions SDOH Screenings   Food Insecurity: No Food Insecurity (07/08/2023)  Recent Concern: Food Insecurity - Food Insecurity Present (07/04/2023)  Housing: High Risk (07/08/2023)  Transportation Needs: Unmet Transportation Needs (07/08/2023)  Utilities: Not At Risk (07/08/2023)  Recent Concern: Utilities - At Risk (07/04/2023)  Alcohol Screen: Low Risk  (06/22/2023)  Social Connections: Unknown (11/23/2021)   [facility] from [facility], [facility]  Tobacco Use: High Risk (07/04/2023)    Readmission Risk Interventions    07/08/2023    1:05 PM 07/06/2023   10:14 AM 02/24/2023   11:58 AM  Readmission Risk Prevention Plan  Post Dischage Appt   Complete  Medication Screening   Complete  Transportation Screening Complete Complete Complete  Medication Review Oceanographer) Complete Complete   PCP or Specialist appointment within 3-5 days of discharge Complete    HRI or Home Care Consult Complete Complete   SW Recovery Care/Counseling Consult Complete Complete   Palliative Care Screening Not Applicable Not Applicable    Skilled Nursing Facility Complete Complete
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
## 2023-07-13 DIAGNOSIS — F339 Major depressive disorder, recurrent, unspecified: Secondary | ICD-10-CM | POA: Diagnosis not present

## 2023-07-13 DIAGNOSIS — G8254 Quadriplegia, C5-C7 incomplete: Secondary | ICD-10-CM | POA: Diagnosis not present

## 2023-07-13 DIAGNOSIS — N390 Urinary tract infection, site not specified: Secondary | ICD-10-CM | POA: Diagnosis not present

## 2023-07-13 DIAGNOSIS — S72001A Fracture of unspecified part of neck of right femur, initial encounter for closed fracture: Secondary | ICD-10-CM | POA: Diagnosis not present

## 2023-07-13 MED ORDER — OXYCODONE HCL 5 MG PO TABS: 5.0000 mg | ORAL_TABLET | Freq: Four times a day (QID) | ORAL | 0 refills | Status: AC | PRN
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
## 2023-07-13 NOTE — TOC Transition Note (Signed)
 Transition of Care [facility]) - Discharge Note   Patient Details  Name: Mark Porter MRN: [id number] Date of Birth: [birth year]  [facility] CM/SW Contact:  Tawni CHRISTELLA Eva, LCSW Phone Number: 07/13/2023, 9:57 AM   Clinical Narrative:     Pt to discharge to [facility], room assignment 121A. RN to call report to [phone]. PTAR called, no further TOC needs. TOC sign-off.  Final next level of care: Skilled Nursing Facility Barriers to Discharge: No Barriers Identified   Patient Goals and CMS Choice Patient states their goals for this hospitalization and ongoing recovery are:: SNf to get stronger CMS Medicare.gov Compare Post Acute Care list provided to:: Patient Choice offered to / list presented to : Patient [place] ownership interest in [facility].provided to:: Patient    Discharge Placement                    Patient and family notified of of transfer: 07/13/23  Discharge Plan and Services Additional resources added to the After Visit Summary for     Discharge Planning Services: CM Consult Post Acute Care Choice: Skilled Nursing Facility                               Social Drivers of Health (SDOH) Interventions SDOH Screenings   Food Insecurity: No Food Insecurity (07/08/2023)  Recent Concern: Food Insecurity - Food Insecurity Present (07/04/2023)  Housing: High Risk (07/08/2023)  Transportation Needs: Unmet Transportation Needs (07/08/2023)  Utilities: Not At Risk (07/08/2023)  Recent Concern: Utilities - At Risk (07/04/2023)  Alcohol Screen: Low Risk  (06/22/2023)  Social Connections: Unknown (11/23/2021)   [facility]  Tobacco Use: High Risk (07/04/2023)     Readmission Risk Interventions    07/08/2023    1:05 PM 07/06/2023   10:14 AM 02/24/2023   11:58 AM  Readmission Risk Prevention Plan  Post Dischage Appt   Complete  Medication Screening   Complete   Transportation Screening Complete Complete Complete  Medication Review Oceanographer) Complete Complete   PCP or Specialist appointment within 3-5 days of discharge Complete    HRI or Home Care Consult Complete Complete   SW Recovery Care/Counseling Consult Complete Complete   Palliative Care Screening Not Applicable Not Applicable   Skilled Nursing Facility Complete Complete

## 2023-07-13 NOTE — Plan of Care (Signed)
   Problem: Activity: Goal: Risk for activity intolerance will decrease Outcome: Progressing   Problem: Nutrition: Goal: Adequate nutrition will be maintained Outcome: Progressing   Problem: Coping: Goal: Level of anxiety will decrease Outcome: Progressing

## 2023-07-14 ENCOUNTER — Other Ambulatory Visit (HOSPITAL_COMMUNITY): Payer: Self-pay

## 2023-07-22 ENCOUNTER — Other Ambulatory Visit (HOSPITAL_COMMUNITY): Payer: Self-pay

## 2023-09-11 ENCOUNTER — Emergency Department (HOSPITAL_COMMUNITY)

## 2023-09-11 ENCOUNTER — Emergency Department (HOSPITAL_COMMUNITY)
Admission: EM | Admit: 2023-09-11 | Discharge: 2023-09-11 | Disposition: A | Discharge status: Home / Self Care | Attending: Emergency Medicine | Admitting: Emergency Medicine

## 2023-09-11 ENCOUNTER — Encounter (HOSPITAL_COMMUNITY): Payer: Self-pay

## 2023-09-11 ENCOUNTER — Other Ambulatory Visit: Payer: Self-pay

## 2023-09-11 DIAGNOSIS — M25551 Pain in right hip: Secondary | ICD-10-CM | POA: Insufficient documentation

## 2023-09-11 DIAGNOSIS — Z7982 Long term (current) use of aspirin: Secondary | ICD-10-CM | POA: Diagnosis not present

## 2023-09-11 DIAGNOSIS — W19XXXA Unspecified fall, initial encounter: Secondary | ICD-10-CM | POA: Diagnosis not present

## 2023-09-11 MED ORDER — KETOROLAC TROMETHAMINE 60 MG/2ML IM SOLN
30.0000 mg | Freq: Once | INTRAMUSCULAR | Status: AC
  Administered 2023-09-11: 30 mg via INTRAMUSCULAR
  Filled 2023-09-11: qty 2

## 2023-09-11 MED ORDER — LIDOCAINE 5 % EX PTCH
2.0000 | MEDICATED_PATCH | CUTANEOUS | Status: DC
  Administered 2023-09-11: 2 via TRANSDERMAL
  Filled 2023-09-11: qty 2

## 2023-09-11 MED ORDER — LIDOCAINE 5 % EX PTCH
1.0000 | MEDICATED_PATCH | CUTANEOUS | 0 refills | Status: DC
  Filled 2023-09-11: qty 30, 30d supply, fill #0
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
## 2023-09-11 NOTE — ED Provider Notes (Signed)
 Asked to speak to patient regarding medication choice.  Patient does not want Toradol.  He would like stronger opioids than he is getting at nursing home.  Without injury I will not be prescribing this medication in the ED or for at home.  I have politely told the patient this.  This is concerning for drug seeking.     Detra Bores, MD 09/11/23 [phone]

## 2023-09-11 NOTE — ED Notes (Signed)
Dr. Palumbo at bedside. 

## 2023-09-11 NOTE — ED Triage Notes (Signed)
 Patient arrives from [employer]. Patient states that he had a fall that happened 1 hour ago.  Patient was trying to self transfer from the wheelchair to the bed and landed on the right hip. Previous right hip surgery. States he is having right hip pain. No deformity, bruising or swelling. Did not hit his head. No LOC.   Staff informed EMS that the patient was switched from oxycodone to tramadol after failing a drug screen. Patient tested negative for opiates but positive for cocaine.

## 2023-09-11 NOTE — ED Notes (Signed)
 Pt kicked right leg continuously while this RN applied lidocaine patch to right hip. Pt states the kicked repeatedly due to cold sensation of lidocaine patch. Full ROM noted to right LLE.

## 2023-09-12 ENCOUNTER — Other Ambulatory Visit (HOSPITAL_COMMUNITY): Payer: Self-pay

## 2024-02-15 ENCOUNTER — Telehealth: Payer: Self-pay

## 2024-02-15 NOTE — Telephone Encounter (Signed)
 error

## 2024-04-11 ENCOUNTER — Inpatient Hospital Stay (HOSPITAL_COMMUNITY)
Admission: EM | Admit: 2024-04-11 | Discharge: 2024-04-13 | DRG: 871 | Disposition: A | Discharge status: Home / Self Care | Attending: Internal Medicine | Admitting: Internal Medicine

## 2024-04-11 ENCOUNTER — Emergency Department (HOSPITAL_COMMUNITY)

## 2024-04-11 ENCOUNTER — Encounter (HOSPITAL_COMMUNITY): Payer: Self-pay | Admitting: Radiology

## 2024-04-11 ENCOUNTER — Other Ambulatory Visit: Payer: Self-pay

## 2024-04-11 DIAGNOSIS — G8929 Other chronic pain: Secondary | ICD-10-CM | POA: Diagnosis present

## 2024-04-11 DIAGNOSIS — G929 Unspecified toxic encephalopathy: Secondary | ICD-10-CM | POA: Diagnosis not present

## 2024-04-11 DIAGNOSIS — Z9079 Acquired absence of other genital organ(s): Secondary | ICD-10-CM

## 2024-04-11 DIAGNOSIS — M542 Cervicalgia: Secondary | ICD-10-CM | POA: Diagnosis present

## 2024-04-11 DIAGNOSIS — K439 Ventral hernia without obstruction or gangrene: Secondary | ICD-10-CM | POA: Diagnosis present

## 2024-04-11 DIAGNOSIS — Z888 Allergy status to other drugs, medicaments and biological substances status: Secondary | ICD-10-CM | POA: Diagnosis not present

## 2024-04-11 DIAGNOSIS — A419 Sepsis, unspecified organism: Secondary | ICD-10-CM | POA: Diagnosis not present

## 2024-04-11 DIAGNOSIS — T405X4A Poisoning by cocaine, undetermined, initial encounter: Secondary | ICD-10-CM | POA: Diagnosis present

## 2024-04-11 DIAGNOSIS — D72829 Elevated white blood cell count, unspecified: Secondary | ICD-10-CM

## 2024-04-11 DIAGNOSIS — T403X4A Poisoning by methadone, undetermined, initial encounter: Secondary | ICD-10-CM | POA: Diagnosis present

## 2024-04-11 DIAGNOSIS — T50904A Poisoning by unspecified drugs, medicaments and biological substances, undetermined, initial encounter: Secondary | ICD-10-CM | POA: Diagnosis present

## 2024-04-11 DIAGNOSIS — M549 Dorsalgia, unspecified: Secondary | ICD-10-CM | POA: Diagnosis present

## 2024-04-11 DIAGNOSIS — R7401 Elevation of levels of liver transaminase levels: Secondary | ICD-10-CM | POA: Diagnosis present

## 2024-04-11 DIAGNOSIS — G8254 Quadriplegia, C5-C7 incomplete: Secondary | ICD-10-CM | POA: Diagnosis present

## 2024-04-11 DIAGNOSIS — R6521 Severe sepsis with septic shock: Secondary | ICD-10-CM

## 2024-04-11 DIAGNOSIS — F17290 Nicotine dependence, other tobacco product, uncomplicated: Secondary | ICD-10-CM | POA: Diagnosis present

## 2024-04-11 DIAGNOSIS — G928 Other toxic encephalopathy: Secondary | ICD-10-CM | POA: Diagnosis present

## 2024-04-11 DIAGNOSIS — F329 Major depressive disorder, single episode, unspecified: Secondary | ICD-10-CM | POA: Diagnosis present

## 2024-04-11 DIAGNOSIS — Z993 Dependence on wheelchair: Secondary | ICD-10-CM

## 2024-04-11 DIAGNOSIS — G9340 Encephalopathy, unspecified: Secondary | ICD-10-CM | POA: Diagnosis present

## 2024-04-11 DIAGNOSIS — R531 Weakness: Secondary | ICD-10-CM | POA: Diagnosis present

## 2024-04-11 DIAGNOSIS — Z9081 Acquired absence of spleen: Secondary | ICD-10-CM | POA: Diagnosis not present

## 2024-04-11 DIAGNOSIS — Z8614 Personal history of Methicillin resistant Staphylococcus aureus infection: Secondary | ICD-10-CM | POA: Diagnosis not present

## 2024-04-11 DIAGNOSIS — Z79899 Other long term (current) drug therapy: Secondary | ICD-10-CM | POA: Diagnosis not present

## 2024-04-11 DIAGNOSIS — Z79891 Long term (current) use of opiate analgesic: Secondary | ICD-10-CM | POA: Diagnosis not present

## 2024-04-11 DIAGNOSIS — I959 Hypotension, unspecified: Secondary | ICD-10-CM

## 2024-04-11 DIAGNOSIS — N179 Acute kidney failure, unspecified: Secondary | ICD-10-CM

## 2024-04-11 LAB — CBC WITH DIFFERENTIAL/PLATELET
Abs Immature Granulocytes: 0.15 K/uL — ABNORMAL HIGH (ref 0.00–0.07)
Basophils Absolute: 0.1 K/uL (ref 0.0–0.1)
Basophils Relative: 0 %
Eosinophils Absolute: 0 K/uL (ref 0.0–0.5)
Eosinophils Relative: 0 %
HCT: 44.9 % (ref 39.0–52.0)
Hemoglobin: 14.6 g/dL (ref 13.0–17.0)
Immature Granulocytes: 1 %
Lymphocytes Relative: 4 %
Lymphs Abs: 0.9 K/uL (ref 0.7–4.0)
MCH: 28.2 pg (ref 26.0–34.0)
MCHC: 32.5 g/dL (ref 30.0–36.0)
MCV: 86.7 fL (ref 80.0–100.0)
Monocytes Absolute: 2.1 K/uL — ABNORMAL HIGH (ref 0.1–1.0)
Monocytes Relative: 10 %
Neutro Abs: 18.8 K/uL — ABNORMAL HIGH (ref 1.7–7.7)
Neutrophils Relative %: 85 %
Platelets: 483 K/uL — ABNORMAL HIGH (ref 150–400)
RBC: 5.18 MIL/uL (ref 4.22–5.81)
RDW: 13.4 % (ref 11.5–15.5)
WBC: 22.1 K/uL — ABNORMAL HIGH (ref 4.0–10.5)
nRBC: 0 % (ref 0.0–0.2)

## 2024-04-11 LAB — URINE DRUG SCREEN
Amphetamines: NEGATIVE
Barbiturates: NEGATIVE
Benzodiazepines: NEGATIVE
Cocaine: POSITIVE — AB
Fentanyl: POSITIVE — AB
Methadone Scn, Ur: POSITIVE — AB
Opiates: NEGATIVE
Tetrahydrocannabinol: NEGATIVE

## 2024-04-11 LAB — URINALYSIS, W/ REFLEX TO CULTURE (INFECTION SUSPECTED)
Bilirubin Urine: NEGATIVE
Glucose, UA: NEGATIVE mg/dL
Ketones, ur: NEGATIVE mg/dL
Leukocytes,Ua: NEGATIVE
Nitrite: NEGATIVE
Protein, ur: 30 mg/dL — AB
Specific Gravity, Urine: 1.017 (ref 1.005–1.030)
pH: 5 (ref 5.0–8.0)

## 2024-04-11 LAB — COMPREHENSIVE METABOLIC PANEL WITH GFR
ALT: 84 U/L — ABNORMAL HIGH (ref 0–44)
AST: 76 U/L — ABNORMAL HIGH (ref 15–41)
Albumin: 4.3 g/dL (ref 3.5–5.0)
Alkaline Phosphatase: 87 U/L (ref 38–126)
Anion gap: 19 — ABNORMAL HIGH (ref 5–15)
BUN: 15 mg/dL (ref 6–20)
CO2: 19 mmol/L — ABNORMAL LOW (ref 22–32)
Calcium: 9.7 mg/dL (ref 8.9–10.3)
Chloride: 102 mmol/L (ref 98–111)
Creatinine, Ser: 1.27 mg/dL — ABNORMAL HIGH (ref 0.61–1.24)
GFR, Estimated: >60 mL/min (ref >60)
Glucose, Bld: 165 mg/dL — ABNORMAL HIGH (ref 70–99)
Potassium: 3.8 mmol/L (ref 3.5–5.1)
Sodium: 140 mmol/L (ref 135–145)
Total Bilirubin: 0.5 mg/dL (ref 0.0–1.2)
Total Protein: 8.3 g/dL — ABNORMAL HIGH (ref 6.5–8.1)

## 2024-04-11 LAB — I-STAT CG4 LACTIC ACID, ED
Lactic Acid, Venous: 2.4 mmol/L (ref 0.5–1.9)
Lactic Acid, Venous: 1.6 mmol/L (ref 0.5–1.9)

## 2024-04-11 LAB — AMMONIA: Ammonia: 25 umol/L (ref 9–35)

## 2024-04-11 LAB — ETHANOL: Alcohol, Ethyl (B): <15 mg/dL (ref <15)

## 2024-04-11 LAB — CK: Total CK: 140 U/L (ref 49–397)

## 2024-04-11 LAB — PROTIME-INR
INR: 1 (ref 0.8–1.2)
Prothrombin Time: 13.6 s (ref 11.4–15.2)

## 2024-04-11 LAB — MRSA NEXT GEN BY PCR, NASAL: MRSA by PCR Next Gen: NOT DETECTED

## 2024-04-11 MED ORDER — MAGNESIUM CITRATE PO SOLN: 1.0000 | Freq: Every day | ORAL | Status: DC | PRN

## 2024-04-11 MED ORDER — NOREPINEPHRINE 4 MG/250ML-% IV SOLN
0.0000 ug/min | INTRAVENOUS | Status: DC
  Administered 2024-04-11: 2 ug/min via INTRAVENOUS
  Filled 2024-04-11: qty 250

## 2024-04-11 MED ORDER — DOCUSATE SODIUM 100 MG PO CAPS: 100.0000 mg | ORAL_CAPSULE | Freq: Two times a day (BID) | ORAL | Status: DC | PRN

## 2024-04-11 MED ORDER — VANCOMYCIN HCL 1500 MG/300ML IV SOLN
1500.0000 mg | Freq: Once | INTRAVENOUS | Status: AC
  Administered 2024-04-11: 1500 mg via INTRAVENOUS
  Filled 2024-04-11: qty 300

## 2024-04-11 MED ORDER — HYDROCODONE-ACETAMINOPHEN 5-325 MG PO TABS
1.0000 | ORAL_TABLET | ORAL | Status: DC | PRN
  Administered 2024-04-11 – 2024-04-12 (×5): 1 via ORAL
  Filled 2024-04-11 (×5): qty 1

## 2024-04-11 MED ORDER — LIDOCAINE 5 % EX PTCH
1.0000 | MEDICATED_PATCH | CUTANEOUS | Status: DC
  Administered 2024-04-11 – 2024-04-13 (×3): 1 via TRANSDERMAL
  Filled 2024-04-11 (×3): qty 1

## 2024-04-11 MED ORDER — NALOXONE HCL 0.4 MG/ML IJ SOLN
0.4000 mg | Freq: Once | INTRAMUSCULAR | Status: AC
  Administered 2024-04-11: 0.4 mg via INTRAVENOUS
  Filled 2024-04-11: qty 1

## 2024-04-11 MED ORDER — IOHEXOL 300 MG/ML  SOLN
100.0000 mL | Freq: Once | INTRAMUSCULAR | Status: AC | PRN
Start: 2024-04-11 — End: 2024-04-11
  Administered 2024-04-11: 100 mL via INTRAVENOUS

## 2024-04-11 MED ORDER — CHLORHEXIDINE GLUCONATE CLOTH 2 % EX PADS
6.0000 | MEDICATED_PAD | Freq: Every day | CUTANEOUS | Status: DC
Start: 2024-04-11 — End: 2024-04-13
  Administered 2024-04-11: 6 via TOPICAL

## 2024-04-11 MED ORDER — VENLAFAXINE HCL ER 37.5 MG PO CP24
75.0000 mg | ORAL_CAPSULE | Freq: Every day | ORAL | Status: DC
  Administered 2024-04-12 – 2024-04-13 (×2): 75 mg via ORAL
  Filled 2024-04-11 (×2): qty 1
  Filled 2024-04-11: qty 2

## 2024-04-11 MED ORDER — POLYETHYLENE GLYCOL 3350 17 G PO PACK: 17.0000 g | PACK | Freq: Every day | ORAL | Status: DC | PRN

## 2024-04-11 MED ORDER — SODIUM CHLORIDE 0.9 % IV BOLUS
30.0000 mL/kg | Freq: Once | INTRAVENOUS | Status: AC
  Administered 2024-04-11: 1905 mL via INTRAVENOUS

## 2024-04-11 MED ORDER — VANCOMYCIN HCL 1250 MG/250ML IV SOLN
1250.0000 mg | INTRAVENOUS | Status: DC
  Administered 2024-04-12: 1250 mg via INTRAVENOUS
  Filled 2024-04-11: qty 250

## 2024-04-11 MED ORDER — LACTATED RINGERS IV SOLN: INTRAVENOUS | Status: DC

## 2024-04-11 MED ORDER — ARIPIPRAZOLE 5 MG PO TABS: 10.0000 mg | ORAL_TABLET | Freq: Every day | ORAL | Status: DC

## 2024-04-11 MED ORDER — PANTOPRAZOLE SODIUM 40 MG PO TBEC
40.0000 mg | DELAYED_RELEASE_TABLET | Freq: Every day | ORAL | Status: DC
  Administered 2024-04-11 – 2024-04-13 (×3): 40 mg via ORAL
  Filled 2024-04-11 (×3): qty 1

## 2024-04-11 MED ORDER — SODIUM CHLORIDE 0.9 % IV SOLN: 250.0000 mL | INTRAVENOUS | Status: AC

## 2024-04-11 MED ORDER — METHOCARBAMOL 500 MG PO TABS
500.0000 mg | ORAL_TABLET | Freq: Three times a day (TID) | ORAL | Status: DC | PRN
  Administered 2024-04-11 – 2024-04-13 (×4): 500 mg via ORAL
  Filled 2024-04-11 (×4): qty 1

## 2024-04-11 MED ORDER — PIPERACILLIN-TAZOBACTAM 3.375 G IVPB
3.3750 g | Freq: Three times a day (TID) | INTRAVENOUS | Status: DC
  Administered 2024-04-11 – 2024-04-12 (×3): 3.375 g via INTRAVENOUS
  Filled 2024-04-11 (×3): qty 50

## 2024-04-11 MED ORDER — NOREPINEPHRINE 4 MG/250ML-% IV SOLN: 0.0000 ug/min | INTRAVENOUS | Status: DC

## 2024-04-11 MED ORDER — SENNOSIDES-DOCUSATE SODIUM 8.6-50 MG PO TABS
2.0000 | ORAL_TABLET | Freq: Two times a day (BID) | ORAL | Status: DC
  Administered 2024-04-11 – 2024-04-13 (×4): 2 via ORAL
  Filled 2024-04-11 (×4): qty 2

## 2024-04-11 MED ORDER — ENOXAPARIN SODIUM 40 MG/0.4ML IJ SOSY
40.0000 mg | PREFILLED_SYRINGE | Freq: Every day | INTRAMUSCULAR | Status: DC
Start: 2024-04-11 — End: 2024-04-13
  Administered 2024-04-11 – 2024-04-12 (×2): 40 mg via SUBCUTANEOUS
  Filled 2024-04-11 (×2): qty 0.4

## 2024-04-11 MED ORDER — PIPERACILLIN-TAZOBACTAM 3.375 G IVPB 30 MIN
3.3750 g | Freq: Once | INTRAVENOUS | Status: AC
  Administered 2024-04-11: 3.375 g via INTRAVENOUS
  Filled 2024-04-11: qty 50

## 2024-04-11 NOTE — Progress Notes (Signed)
 ED Pharmacy Antibiotic Sign Off An antibiotic consult was received from an ED provider for zosyn per pharmacy dosing for sepsis. A chart review was completed to assess appropriateness.   The following one time order(s) were placed:  Zosyn 3.375 gm  Further antibiotic and/or antibiotic pharmacy consults should be ordered by the admitting provider if indicated.   Thank you for allowing pharmacy to be a part of this patient's care.    Rosaline IVAR Edison, Pharm.D Use secure chat for questions 04/11/2024 5:54 AM Clinical Pharmacist 04/11/24 5:54 AM

## 2024-04-11 NOTE — Progress Notes (Signed)
 ED Pharmacy Antibiotic Sign Off An antibiotic consult was received from an ED provider for vancomycin  per pharmacy dosing for sepsis. A chart review was completed to assess appropriateness.   The following one time order(s) were placed:  Vancomycin  1500 mg  Further antibiotic and/or antibiotic pharmacy consults should be ordered by the admitting provider if indicated.   Thank you for allowing pharmacy to be a part of this patient's care.   Stefano MARLA Bologna, PharmD, BCPS Clinical Pharmacist 04/11/2024 8:19 AM

## 2024-04-11 NOTE — ED Provider Notes (Signed)
 Physical Exam  BP (!) 77/59   Pulse 81   Temp (!) 96.8 F (36 C) (Rectal)   Resp 17   Ht 5' 10 (1.778 m)   Wt 63.5 kg   SpO2 95%   BMI 20.09 kg/m   Physical Exam  Procedures  .Critical Care  Performed by: Jerrol Agent, MD Authorized by: Jerrol Agent, MD   Critical care provider statement:    Critical care time (minutes):  30   Critical care was necessary to treat or prevent imminent or life-threatening deterioration of the following conditions:  Sepsis and shock   Critical care was time spent personally by me on the following activities:  Development of treatment plan with patient or surrogate, discussions with consultants, evaluation of patient's response to treatment, examination of patient, ordering and review of laboratory studies, ordering and review of radiographic studies, ordering and performing treatments and interventions, pulse oximetry, re-evaluation of patient's condition and review of old charts   Care discussed with: admitting provider     ED Course / MDM    Medical Decision Making Amount and/or Complexity of Data Reviewed Labs: ordered. Radiology: ordered.  Risk Prescription drug management. Decision regarding hospitalization.   Assumed care from the prior ER provider, please see her note for initial MDM.  Briefly, the patient is a 58 year old male with medical history significant for C5-C7 incomplete quadriplegia, polysubstance abuse presenting to the emergency department after being found down following crack cocaine use.  Had been administered Narcan  in the field without effect.  Presents with concern for sepsis of unclear etiology.  Hypotensive in the ED with a white count of 22, elevated lactate 2.4, now on peripheral Levophed.  Patient was examined bedside, maintaining maps greater than 65 but blood pressure still in the 80s systolic.  Patient endorses diffuse whole body pains all over with no clear focality.  Considered rhabdomyolysis as the  patient has a an AKI with a creatinine of 1.27 from baseline of around 0.6-0.7.  He was found to have an anion gap acidosis with a bicarbonate of 19 and anion gap of 19, lactic acidosis 2.4.  Blood cultures were collected and the patient had been started on IV Zosyn.  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  Will add on vancomycin .  CT head, cervical spine, chest abdomen pelvis pending, critical care has been consulted for admission.   CT Head and Cervical Spine:  IMPRESSION:  1. No acute brain injury.  2. No acute cervical spine injury.  3. Mild to moderate spondylosis of the cervical spine with  multilevel disc disease and multilevel neural foraminal narrowing as  described.  4. 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   CT C/A/P:  IMPRESSION:  1. No acute findings in the chest, abdomen or pelvis.  2. Interval development of mild superior endplate compression of the  L3 vertebral body since the prior CT with approximately 25-30%  vertebral compression.  3. Stable chronic compression deformity of the T12 vertebral body.  4. Stable ventral hernia defects containing fat including dominant  midline ventral hernia just above the umbilicus.  5. Hepatic steatosis.  6. Atherosclerosis of the distal abdominal aorta with new  noncalcified plaque/irregular mural thrombus along the posterior  wall of the distal aorta. No abdominal aortic aneurysm.  7. Resolved low-density fluid previously noted in the right  prostate.  8. Coronary atherosclerosis with calcified plaque in the  LAD.    Labs: Repeat lactic acid normalized to 1.6. CK 140.  Patient remains in shock with peripheral Levophed requirement, concern for bacteremia of unclear etiology.  Patient rolled and found to have no large sacral decubitus ulcer, his body is covered in various abrasions and excoriations.  Pulmonary critical care was  consulted for admission for continued management.  Patient subsequently admitted in critical but stabilized condition.    Jerrol Agent, MD 04/11/24 1011

## 2024-04-11 NOTE — ED Provider Notes (Signed)
 [place] EMERGENCY DEPARTMENT AT [facility] Provider Note   CSN: [id number] Arrival date & time: 04/11/24  0501     Patient presents with: Drug Overdose   Mark Porter is a 58 y.o. male.   The history is provided by the EMS personnel and medical records.  Drug Overdose  Mark Porter is a 58 y.o. male who presents to the Emergency Department complaining of altered mental status. He presents to the emergency department by EMS for evaluation following a possible overdose. EMS brings him from a hotel room after taking possibly crack cocaine. Administer did give Narcan  but there was no change after administering this. He did have briefly assisted respirations. EMS report hypotension, tachycardia and tachypnea. Patient is unable to provide meaningful history. He does have a history of substance use disorder as well as C5-7 incomplete quadriplegia     Prior to Admission medications   Medication Sig Start Date End Date Taking? Authorizing Provider  ARIPiprazole  (ABILIFY ) 10 MG tablet Take 1 tablet (10 mg total) by mouth daily. 07/01/23   Raye Ellouise BROCKS, [name]  aspirin  EC 81 MG tablet Take 1 tablet (81 mg total) by mouth 2 (two) times daily. To prevent blood clots for 30 days after surgery. 07/09/23   Gawne, Meghan M, PA-C  lidocaine  (LIDODERM ) 5 % Place 1 patch onto the skin daily. Remove & Discard patch within 12 hours or as directed by MD 09/11/23   Nettie, April, MD  magnesium  citrate SOLN Take 296 mLs (1 Bottle total) by mouth daily as needed for severe constipation. 07/08/23   Gonfa, Taye T, MD  methocarbamol  (ROBAXIN ) 500 MG tablet Take 1 tablet (500 mg total) by mouth every 8 (eight) hours as needed for muscle spasms. 07/08/23   Gonfa, Taye T, MD  pantoprazole  (PROTONIX ) 40 MG tablet Take 1 tablet (40 mg total) by mouth daily. 07/09/23   Gonfa, Taye T, MD  senna-docusate (SENOKOT-S) 8.6-50 MG tablet Take 2 tablets by mouth 2 (two) times daily. 07/08/23   Gonfa, Taye T, MD   traZODone  (DESYREL ) 50 MG tablet Take 1 tablet (50 mg total) by mouth at bedtime as needed for sleep. 06/30/23   Ntuen, Ellouise BROCKS, [name]  venlafaxine  XR (EFFEXOR -XR) 75 MG 24 hr capsule Take 1 capsule (75 mg total) by mouth daily with breakfast. 07/01/23   Ntuen, Tina C, [name]    Allergies: Carisoprodol    Review of Systems  All other systems reviewed and are negative.   Updated Vital Signs BP (!) 77/59   Pulse 81   Temp (!) 96.8 F (36 C) (Rectal)   Resp 17   Ht 5' 10 (1.778 m)   Wt 63.5 kg   SpO2 95%   BMI 20.09 kg/m   Physical Exam Vitals and nursing note reviewed.  Constitutional:      Appearance: He is well-developed.     Comments: Lethargic. Ill appearing  HENT:     Head: Normocephalic and atraumatic.  Cardiovascular:     Rate and Rhythm: Regular rhythm. Tachycardia present.     Heart sounds: No murmur heard. Pulmonary:     Effort: Pulmonary effort is normal. No respiratory distress.     Comments: Decreased air movement bilaterally Abdominal:     Palpations: Abdomen is soft.     Tenderness: There is no abdominal tenderness. There is no guarding or rebound.  Musculoskeletal:        General: No tenderness.     Cervical back: Neck supple.  Comments: There is no lower extremity edema. There are multiple wounds to the right lower extremity, right flank and buttocks. There is no significant surrounding edema or erythema.  Skin:    General: Skin is warm and dry.  Neurological:     Comments: Difficult to understand speech. Does not follow all commands. He rolls back and forth on the stretcher, moved bilateral upper extremities. No significant movement to bilateral lower extremities. Oriented to hospital.  Psychiatric:     Comments: Agitated     (all labs ordered are listed, but only abnormal results are displayed) Labs Reviewed  COMPREHENSIVE METABOLIC PANEL WITH GFR - Abnormal; Notable for the following components:      Result Value   CO2 19 (*)    Glucose, Bld  165 (*)    Creatinine, Ser 1.27 (*)    Total Protein 8.3 (*)    AST 76 (*)    ALT 84 (*)    Anion gap 19 (*)    All other components within normal limits  CBC WITH DIFFERENTIAL/PLATELET - Abnormal; Notable for the following components:   WBC 22.1 (*)    Platelets 483 (*)    Neutro Abs 18.8 (*)    Monocytes Absolute 2.1 (*)    Abs Immature Granulocytes 0.15 (*)    All other components within normal limits  I-STAT CG4 LACTIC ACID, ED - Abnormal; Notable for the following components:   Lactic Acid, Venous 2.4 (*)    All other components within normal limits  CULTURE, BLOOD (ROUTINE X 2)  CULTURE, BLOOD (ROUTINE X 2)  AMMONIA  ETHANOL  PROTIME-INR  URINALYSIS, W/ REFLEX TO CULTURE (INFECTION SUSPECTED)  URINE DRUG SCREEN  I-STAT CG4 LACTIC ACID, ED    EKG: EKG Interpretation Date/Time:  Wednesday April 11 2024 05:10:42 EDT Ventricular Rate:  106 PR Interval:  139 QRS Duration:  96 QT Interval:  346 QTC Calculation: 460 R Axis:   131  Text Interpretation: Sinus tachycardia Right atrial enlargement Low voltage, extremity leads Consider right ventricular hypertrophy Confirmed by Griselda Norris [phone]) on 04/11/2024 6:51:40 AM  Radiology: [facility] Chest Port 1 View if patient is in a treatment room. Result Date: 04/11/2024 CLINICAL DATA:  Overdose suspected. EXAM: PORTABLE CHEST 1 VIEW COMPARISON:  05/25/2023 FINDINGS: Leftward patient rotation. Cardiopericardial silhouette is at upper limits of normal for size. Right lung clear. Retrocardiac atelectasis or infiltrate noted. No pulmonary edema or substantial pleural effusion. Telemetry leads overlie the chest. IMPRESSION: Retrocardiac atelectasis or infiltrate. Electronically Signed   By: Camellia Candle M.D.   On: 04/11/2024 06:06     Procedures  CRITICAL CARE Performed by: Norris Griselda   Total critical care time: 35 minutes  Critical care time was exclusive of separately billable procedures and treating other  patients.  Critical care was necessary to treat or prevent imminent or life-threatening deterioration.  Critical care was time spent personally by me on the following activities: development of treatment plan with patient and/or surrogate as well as nursing, discussions with consultants, evaluation of patient's response to treatment, examination of patient, obtaining history from patient or surrogate, ordering and performing treatments and interventions, ordering and review of laboratory studies, ordering and review of radiographic studies, pulse oximetry and re-evaluation of patient's condition.  Medications Ordered in the ED  norepinephrine (LEVOPHED) 4mg  in (0.016 mg/mL) premix infusion (2 mcg/min Intravenous New Bag/Given 04/11/24 0708)  sodium chloride  0.9 % bolus 1,905 mL (1,905 mLs Intravenous New Bag/Given 04/11/24 0548)  piperacillin-tazobactam (ZOSYN) IVPB 3.375  g (0 g Intravenous Stopped 04/11/24 0712)  naloxone  (NARCAN ) injection 0.4 mg (0.4 mg Intravenous Given 04/11/24 0705)                                    Medical Decision Making Amount and/or Complexity of Data Reviewed Labs: ordered. Radiology: ordered.  Risk Prescription drug management.   Patient with history of incomplete quadriplegia here for evaluation of following unresponsive episode, unclear if this was a drug overdose versus septic process. Patient is unable to provide meaningful history. Unable to contact family members. He is disheveled on examination. He is hypotensive. Temperature is 96. He was started on IV fluids for his hypotension, antibiotics for possible sepsis. On repeat assessment after fluids in the emergency department patient continues to remain hypotensive but does seem slightly more alert than previously. He states he uses marijuana, denies any additional drug use. He reports feeling unwell, has no additional complaints aside from chronic back pain. Will start on Levophed for blood pressure  support. Critical care consulted for admission for ongoing care. He was also treated with Narcan  with no significant change in his blood pressure.     Final diagnoses:  None    ED Discharge Orders     None          Griselda Norris, MD 04/11/24 [id number]
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
## 2024-04-11 NOTE — ED Triage Notes (Signed)
 Pt from home for unintentional overdose.  Possibly crack cocaine per brother.  A bystander administered Narcan , but no obvious change noted.

## 2024-04-11 NOTE — ED Notes (Signed)
 Transfer of care report given to Abbigail, RN at bedside.  Discussed pt Sx, Hx, labs, imaging, lines, current condition, vitals.  All questions asked were answered.

## 2024-04-11 NOTE — Progress Notes (Signed)
 Pharmacy Antibiotic Note  Mark Porter is a 58 y.o. male admitted on 04/11/2024 with AMS, concern for possible drug overdose. Pharmacy has been consulted for vancomycin  and Zosyn dosing for sepsis.  Plan: -Vancomycin  1500 mg IV x 1 followed by 1250 mg IV q24h -Zosyn 3.375 g IV q8h extended infusion -Continue to follow renal function, cultures and clinical progress for dose adjustments and de-escalation as indicated  Height: 5' 10 (177.8 cm) Weight: 63.5 kg (140 lb) IBW/kg (Calculated) : 73  Temp (24hrs), Avg:97.5 F (36.4 C), Min:96.8 F (36 C), Max:98 F (36.7 C)  Recent Labs  Lab 04/11/24 0514 04/11/24 0605 04/11/24 0839  WBC 22.1*  --   --   CREATININE 1.27*  --   --   LATICACIDVEN  --  2.4* 1.6    Estimated Creatinine Clearance: 57.6 mL/min (A) (by C-G formula based on SCr of 1.27 mg/dL (H)).    Allergies  Allergen Reactions   Carisoprodol Other (See Comments)    Soma. Urinary retention    Antimicrobials this admission: Zosyn 10/1 >> Vancomycin  10/1 >>  Dose adjustments this admission: NA  Microbiology results: 10/1 BCx:  10/1 MRSA PCR:   Thank you for allowing pharmacy to be a part of this patient's care.  Stefano MARLA Bologna, PharmD, BCPS Clinical Pharmacist 04/11/2024 10:46 AM

## 2024-04-11 NOTE — Plan of Care (Signed)

## 2024-04-12 LAB — BASIC METABOLIC PANEL WITH GFR
Anion gap: 11 (ref 5–15)
BUN: 10 mg/dL (ref 6–20)
CO2: 19 mmol/L — ABNORMAL LOW (ref 22–32)
Calcium: 8.4 mg/dL — ABNORMAL LOW (ref 8.9–10.3)
Chloride: 109 mmol/L (ref 98–111)
Creatinine, Ser: 0.81 mg/dL (ref 0.61–1.24)
GFR, Estimated: >60 mL/min (ref >60)
Glucose, Bld: 135 mg/dL — ABNORMAL HIGH (ref 70–99)
Potassium: 4.1 mmol/L (ref 3.5–5.1)
Sodium: 140 mmol/L (ref 135–145)

## 2024-04-12 LAB — PHOSPHORUS: Phosphorus: 2.8 mg/dL (ref 2.5–4.6)

## 2024-04-12 LAB — CBC
HCT: 39.5 % (ref 39.0–52.0)
Hemoglobin: 12.3 g/dL — ABNORMAL LOW (ref 13.0–17.0)
MCH: 27.4 pg (ref 26.0–34.0)
MCHC: 31.1 g/dL (ref 30.0–36.0)
MCV: 88 fL (ref 80.0–100.0)
Platelets: 400 K/uL (ref 150–400)
RBC: 4.49 MIL/uL (ref 4.22–5.81)
RDW: 13.8 % (ref 11.5–15.5)
WBC: 8.5 K/uL (ref 4.0–10.5)
nRBC: 0 % (ref 0.0–0.2)

## 2024-04-12 LAB — MAGNESIUM: Magnesium: 2.4 mg/dL (ref 1.7–2.4)

## 2024-04-12 LAB — HIV ANTIBODY (ROUTINE TESTING W REFLEX): HIV Screen 4th Generation wRfx: NONREACTIVE
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
## 2024-04-12 NOTE — Plan of Care (Signed)

## 2024-04-12 NOTE — Plan of Care (Signed)
  Problem: Clinical Measurements: Goal: Will remain free from infection 04/12/2024 0036 by Arrie Luke NOVAK, RN Outcome: Progressing 04/12/2024 0036 by Arrie Luke NOVAK, RN Outcome: Progressing 04/12/2024 0036 by Arrie Luke NOVAK, RN Outcome: Progressing   Problem: Clinical Measurements: Goal: Diagnostic test results will improve 04/12/2024 0036 by Arrie Luke NOVAK, RN Outcome: Progressing 04/12/2024 0036 by Arrie Luke NOVAK, RN Outcome: Progressing 04/12/2024 0036 by Arrie Luke NOVAK, RN Outcome: Progressing   Problem: Clinical Measurements: Goal: Respiratory complications will improve 04/12/2024 0036 by Arrie Luke NOVAK, RN Outcome: Progressing 04/12/2024 0036 by Arrie Luke NOVAK, RN Outcome: Progressing 04/12/2024 0036 by Arrie Luke NOVAK, RN Outcome: Progressing   Problem: Activity: Goal: Risk for activity intolerance will decrease 04/12/2024 0036 by Arrie Luke NOVAK, RN Outcome: Progressing 04/12/2024 0036 by Arrie Luke NOVAK, RN Outcome: Progressing 04/12/2024 0036 by Arrie Luke NOVAK, RN Outcome: Progressing   Problem: Nutrition: Goal: Adequate nutrition will be maintained 04/12/2024 0036 by Arrie Luke NOVAK, RN Outcome: Progressing 04/12/2024 0036 by Arrie Luke NOVAK, RN Outcome: Progressing 04/12/2024 0036 by Arrie Luke NOVAK, RN Outcome: Progressing

## 2024-04-12 NOTE — Plan of Care (Signed)

## 2024-04-13 ENCOUNTER — Other Ambulatory Visit (HOSPITAL_COMMUNITY): Payer: Self-pay

## 2024-04-13 DIAGNOSIS — G9340 Encephalopathy, unspecified: Secondary | ICD-10-CM | POA: Diagnosis not present

## 2024-04-13 DIAGNOSIS — G929 Unspecified toxic encephalopathy: Secondary | ICD-10-CM | POA: Diagnosis not present

## 2024-04-13 DIAGNOSIS — T50904A Poisoning by unspecified drugs, medicaments and biological substances, undetermined, initial encounter: Secondary | ICD-10-CM

## 2024-04-13 LAB — CBC
HCT: 38 % — ABNORMAL LOW (ref 39.0–52.0)
Hemoglobin: 12.5 g/dL — ABNORMAL LOW (ref 13.0–17.0)
MCH: 27.5 pg (ref 26.0–34.0)
MCHC: 32.9 g/dL (ref 30.0–36.0)
MCV: 83.5 fL (ref 80.0–100.0)
Platelets: 441 K/uL — ABNORMAL HIGH (ref 150–400)
RBC: 4.55 MIL/uL (ref 4.22–5.81)
RDW: 13.5 % (ref 11.5–15.5)
WBC: 8.7 K/uL (ref 4.0–10.5)
nRBC: 0 % (ref 0.0–0.2)

## 2024-04-13 LAB — BASIC METABOLIC PANEL WITH GFR
Anion gap: 10 (ref 5–15)
BUN: 10 mg/dL (ref 6–20)
CO2: 23 mmol/L (ref 22–32)
Calcium: 8.6 mg/dL — ABNORMAL LOW (ref 8.9–10.3)
Chloride: 106 mmol/L (ref 98–111)
Creatinine, Ser: 0.74 mg/dL (ref 0.61–1.24)
GFR, Estimated: >60 mL/min (ref >60)
Glucose, Bld: 74 mg/dL (ref 70–99)
Potassium: 3.7 mmol/L (ref 3.5–5.1)
Sodium: 139 mmol/L (ref 135–145)

## 2024-04-13 LAB — MAGNESIUM: Magnesium: 2 mg/dL (ref 1.7–2.4)

## 2024-04-13 LAB — PHOSPHORUS: Phosphorus: 3 mg/dL (ref 2.5–4.6)

## 2024-04-13 MED ORDER — LIDOCAINE 5 % EX PTCH
1.0000 | MEDICATED_PATCH | CUTANEOUS | 0 refills | Status: AC
  Filled 2024-04-13: qty 30, 30d supply, fill #0

## 2024-04-13 MED ORDER — METHOCARBAMOL 500 MG PO TABS
500.0000 mg | ORAL_TABLET | Freq: Three times a day (TID) | ORAL | 0 refills | Status: AC | PRN
  Filled 2024-04-13: qty 45, 15d supply, fill #0

## 2024-04-13 MED ORDER — ARIPIPRAZOLE 10 MG PO TABS
10.0000 mg | ORAL_TABLET | Freq: Every day | ORAL | 0 refills | Status: AC
  Filled 2024-04-13: qty 30, 30d supply, fill #0

## 2024-04-13 MED ORDER — VENLAFAXINE HCL ER 75 MG PO CP24
75.0000 mg | ORAL_CAPSULE | Freq: Every day | ORAL | 0 refills | Status: AC
  Filled 2024-04-13: qty 30, 30d supply, fill #0

## 2024-04-13 NOTE — Progress Notes (Signed)
 Discharge meds in a secure bag delivered to pt in room by this RN- Lidocaine  patches declined by patient - not covered  by Medicare- cost was $90.

## 2024-04-13 NOTE — Discharge Summary (Signed)
 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  Patient was initially treated for possible infectious process given hypotension and mental status changes but improved with Narcan  so antibiotics were discontinued.  Given marked improvement in patient's symptoms, vitals he is otherwise stable and agreeable for discharge back home.  Lengthy discussion at bedside in regards to patient's home narcotic regimen.  He will not have any new narcotic medications prescribed at this time from our facility.  In hopes to limit any further polypharmacy/events.  Refills were prescribed x 1 as below given patient reports being out of most meds but he will need to follow-up with his primary team at [facility] medical for further pain management.  Discharge Diagnoses:  Principal Problem:   Sepsis (HCC) Active Problems:   Acute encephalopathy   Overdose of undetermined intent  Acute toxic encephalopathy secondary to polypharmacy/incidental overdose - UDS is positive for methadone which is not on his med profile per [place]MP[place] as well as cocaine - Most recent fill at [facility] medical Mark Porter prescribing 30 days work 8/29)  -Responded appropriately to narcan    Hypotension - presumed 2/2 above, resolved.   Chronic neck and back pain  - Patient reports oxycodone  50 mg 4 times daily which confirmed on PMP aware prescribed by  Mark Porter last 30-day prescription on 03/09/2024 there are no further refills or recent activity on this medication, will defer to primary team for ongoing chronic pain management. - Stressed with patient today to contact his primary team before the weekend if he does need refills or further evaluation for narcotics given above   Discharge Instructions  Discharge Instructions     Call MD for:  difficulty breathing, headache or visual disturbances   Complete by: As directed    Call MD for:  extreme fatigue   Complete by: As directed    Call MD for:  hives   Complete by: As directed    Call MD for:  persistant dizziness or light-headedness   Complete by: As directed    Call MD for:  persistant nausea and vomiting   Complete by: As directed    Call MD for:  severe uncontrolled pain   Complete by: As directed    Call MD for:  temperature >100.4   Complete by: As directed    Diet - low sodium heart healthy   Complete by: As directed    Discharge instructions   Complete by: As directed    Please follow-up with PCP within 1 week to discuss pain regimen given accidental overdose, would recommend decreasing any home narcotic medications you have leftover and avoid taking multiple medications at the same time.   Increase activity slowly   Complete by: As directed       Allergies as of 04/13/2024       Reactions   Carisoprodol Other (See Comments)   Soma. Urinary retention        Medication List     TAKE these medications    ARIPiprazole  10 MG tablet Commonly  known as: ABILIFY  Take 1 tablet (10 mg total) by mouth daily.   lidocaine  5 % Commonly known as: Lidoderm  Place 1 patch onto the skin daily. Remove & Discard patch within 12 hours or as directed by MD   magnesium  citrate Soln Take 296 mLs (1 Bottle total) by mouth daily as needed for severe constipation.   methocarbamol  500 MG tablet Commonly known as: ROBAXIN  Take 1 tablet (500 mg total) by mouth every 8  (eight) hours as needed for up to 15 days for muscle spasms.   pantoprazole  40 MG tablet Commonly known as: PROTONIX  Take 1 tablet (40 mg total) by mouth daily.   senna-docusate 8.6-50 MG tablet Commonly known as: Senokot-S Take 2 tablets by mouth 2 (two) times daily.   venlafaxine  XR 75 MG 24 hr capsule Commonly known as: EFFEXOR -XR Take 1 capsule (75 mg total) by mouth daily with breakfast.        Allergies  Allergen Reactions   Carisoprodol Other (See Comments)    Soma. Urinary retention    Consultations: PCCM   Procedures/Studies: CT CHEST ABDOMEN PELVIS W CONTRAST Result Date: 04/11/2024 CLINICAL DATA:  Possible sepsis with hypotension, tachycardia and tachypnea. Leukocytosis and elevated lactate. EXAM: CT CHEST, ABDOMEN, AND PELVIS WITH CONTRAST TECHNIQUE: Multidetector CT imaging of the chest, abdomen and pelvis was performed following the standard protocol during bolus administration of intravenous contrast. RADIATION DOSE REDUCTION: This exam was performed according to the departmental dose-optimization program which includes automated exposure control, adjustment of the mA and/or kV according to patient size and/or use of iterative reconstruction technique. CONTRAST:  OMNIPAQUE  IOHEXOL  300 MG/ML  SOLN COMPARISON:  CT abdomen pelvis 05/25/2023 FINDINGS: CT CHEST FINDINGS Cardiovascular: Normal heart size. No pericardial fluid. Mild calcified plaque in the distribution of the LAD. Normal caliber thoracic aorta and central pulmonary arteries. Mediastinum/Nodes: No enlarged mediastinal, hilar, or axillary lymph nodes. Thyroid gland, trachea, and esophagus demonstrate no significant findings. Lungs/Pleura: Scattered atelectasis in both lungs, right greater than left. There is no evidence of pulmonary edema, consolidation, pneumothorax, nodule or pleural fluid. Musculoskeletal: No chest wall mass or suspicious bone lesions identified. CT ABDOMEN PELVIS FINDINGS Hepatobiliary:  Hepatic steatosis. No hepatic masses or biliary dilatation. The gallbladder is unremarkable. Pancreas: Unremarkable. No pancreatic ductal dilatation or surrounding inflammatory changes. Spleen: Status post splenectomy. Stable remnant splenules in the left upper quadrant including rim calcified splenic remnant. Adrenals/Urinary Tract: Adrenal glands are unremarkable. Kidneys are normal, without renal calculi, focal lesion, or hydronephrosis. Bladder is unremarkable. Stomach/Bowel: Bowel shows no evidence of obstruction, ileus, inflammation or lesion. The appendix is normal. No free intraperitoneal air. Vascular/Lymphatic: Atherosclerosis of the distal abdominal aorta with new noncalcified plaque/irregular mural thrombus along the posterior wall of the distal aorta. No abdominal aortic aneurysm. No lymphadenopathy identified. Reproductive: Prostate is unremarkable. Resolved low-density fluid previously noted in the right prostate. Other: Stable ventral hernia defects containing fat including dominant midline ventral hernia just above the umbilicus. Musculoskeletal: Stable chronic compression deformity of the T12 vertebral body. Interval development of mild superior endplate compression of the L3 vertebral body since the prior CT with approximately 25-30% vertebral compression. IMPRESSION: 1. No acute findings in the chest, abdomen or pelvis. 2. Interval development of mild superior endplate compression of the L3 vertebral body since the prior CT with approximately 25-30% vertebral compression. 3. Stable chronic compression deformity of the T12 vertebral body. 4. Stable ventral hernia defects containing fat including dominant midline ventral hernia just above the umbilicus. 5. Hepatic steatosis. 6.  Atherosclerosis of the distal abdominal aorta with new noncalcified plaque/irregular mural thrombus along the posterior wall of the distal aorta. No abdominal aortic aneurysm. 7. Resolved low-density fluid previously noted  in the right prostate. 8. Coronary atherosclerosis with calcified plaque in the LAD. Electronically Signed   By: Marcey Moan M.D.   On: 04/11/2024 09:43   CT Head Wo Contrast Result Date: 04/11/2024 CLINICAL DATA:  Mental status change.  Unintentional overdose. EXAM: CT HEAD WITHOUT CONTRAST CT CERVICAL SPINE WITHOUT CONTRAST TECHNIQUE: Multidetector CT imaging of the head and cervical spine was performed following the standard protocol without intravenous contrast. Multiplanar CT image reconstructions of the cervical spine were also generated. RADIATION DOSE REDUCTION: This exam was performed according to the departmental dose-optimization program which includes automated exposure control, adjustment of the mA and/or kV according to patient size and/or use of iterative reconstruction technique. COMPARISON:  02/27/2023 FINDINGS: CT HEAD FINDINGS Brain: Ventricles, cisterns and other CSF spaces are normal. No mass, mass effect, shift of midline structures or acute hemorrhage. No evidence of acute infarction. Vascular: No hyperdense vessel or unexpected calcification. Skull: Normal. Negative for fracture or focal lesion. Sinuses/Orbits: Paranasal sinuses are well developed with minimal opacification over the ethmoid air cells. Orbits are normal. Other: None. CT CERVICAL SPINE FINDINGS Alignment: Normal. Skull base and vertebrae: There is mild to moderate spondylosis of the cervical spine to include uncovertebral joint spurring and facet arthropathy. Vertebral body heights are maintained. 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 No acute fracture. Minimal bilateral neural foraminal narrowing at the C3-4 level with moderate left-sided neural from narrowing at the C4-5 level. Mild to moderate left-sided neural from narrowing at the C5-6 level. Soft tissues and spinal canal: Small tubular radiopacity over the left posterior aspect  of the spinal canal at the C6 level possibly old catheter segment and unchanged. Prevertebral soft tissues are normal. Disc levels: Moderate disc space narrowing at the C3-4 level with near obliteration of the disc spaces at the C6-7 and C7-T1 levels as described above. Upper chest: No acute findings. Other: None. IMPRESSION: 1. No acute brain injury. 2. No acute cervical spine injury. 3. 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 4. 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 Electronically Signed   By: Toribio Agreste M.D.   On: 04/11/2024 09:33   CT Cervical Spine Wo Contrast Result Date: 04/11/2024 CLINICAL DATA:  Mental status change.  Unintentional overdose. EXAM: CT HEAD WITHOUT CONTRAST CT CERVICAL SPINE WITHOUT CONTRAST TECHNIQUE: Multidetector CT imaging of the head and cervical spine was performed following the standard protocol without intravenous contrast. Multiplanar CT image reconstructions of the cervical spine were also generated. RADIATION DOSE REDUCTION: This exam was performed according to the departmental dose-optimization program which includes automated exposure control, adjustment of the mA and/or kV according to patient size and/or use of iterative reconstruction technique. COMPARISON:  02/27/2023 FINDINGS: CT HEAD FINDINGS Brain: Ventricles, cisterns and other CSF spaces are normal. No mass, mass effect, shift of midline structures or acute hemorrhage. No evidence of acute infarction. Vascular: No hyperdense vessel or unexpected calcification. Skull: Normal. Negative for fracture or focal lesion. Sinuses/Orbits: Paranasal sinuses are well developed with minimal opacification over the ethmoid air cells. Orbits are normal. Other: None. CT CERVICAL SPINE FINDINGS Alignment: Normal. Skull base and vertebrae: There is mild to moderate spondylosis of the cervical  spine to include  uncovertebral joint spurring and facet arthropathy. Vertebral body heights are maintained. 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 No acute fracture. Minimal bilateral neural foraminal narrowing at the C3-4 level with moderate left-sided neural from narrowing at the C4-5 level. Mild to moderate left-sided neural from narrowing at the C5-6 level. Soft tissues and spinal canal: Small tubular radiopacity over the left posterior aspect of the spinal canal at the C6 level possibly old catheter segment and unchanged. Prevertebral soft tissues are normal. Disc levels: Moderate disc space narrowing at the C3-4 level with near obliteration of the disc spaces at the C6-7 and C7-T1 levels as described above. Upper chest: No acute findings. Other: None. IMPRESSION: 1. No acute brain injury. 2. No acute cervical spine injury. 3. 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 4. 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 Electronically Signed   By: Toribio Agreste M.D.   On: 04/11/2024 09:33   DG Chest Port 1 View if patient is in a treatment room. Result Date: 04/11/2024 CLINICAL DATA:  Overdose suspected. EXAM: PORTABLE CHEST 1 VIEW COMPARISON:  05/25/2023 FINDINGS: Leftward patient rotation. Cardiopericardial silhouette is at upper limits of normal for size. Right lung clear. Retrocardiac atelectasis or infiltrate noted. No pulmonary edema or substantial pleural effusion. Telemetry leads overlie the chest. IMPRESSION: Retrocardiac atelectasis or infiltrate. Electronically Signed   By: Camellia Candle M.D.   On: 04/11/2024 06:06     Subjective: No acute issues or events overnight   Discharge Exam: Vitals:   04/13/24 0407 04/13/24 0827  BP:  120/82  Pulse:  (!) 58  Resp:  18  Temp: 98.2 F (36.8 C) 97.6 F (36.4 C)   SpO2:  95%   Vitals:   04/13/24 0300 04/13/24 0400 04/13/24 0407 04/13/24 0827  BP: (!) 152/78 (!) 140/76  120/82  Pulse: 63 60  (!) 58  Resp: 11 (!) 22  18  Temp:   98.2 F (36.8 C) 97.6 F (36.4 C)  TempSrc:   Axillary Oral  SpO2: 96% 96%  95%  Weight:      Height:        General: Pt is alert, awake, not in acute distress Cardiovascular: RRR, S1/S2 +, no rubs, no gallops Respiratory: CTA bilaterally, no wheezing, no rhonchi Abdominal: Soft, NT, ND, bowel sounds + Extremities: no edema, no cyanosis    The results of significant diagnostics from this hospitalization (including imaging, microbiology, ancillary and laboratory) are listed below for reference.     Microbiology: Recent Results (from the past 240 hours)  Culture, blood (Routine x 2)     Status: None (Preliminary result)   Collection Time: 04/11/24  5:14 AM   Specimen: BLOOD  Result Value Ref Range Status   Specimen Description   Final    BLOOD RIGHT ANTECUBITAL Performed at [facility], [address], [place], [place] [address]    Special Requests   Final    BOTTLES DRAWN AEROBIC AND ANAEROBIC Blood Culture results may not be optimal due to an inadequate volume of blood received in culture bottles Performed at [facility], [address], [place], [place] [address]    Culture   Final    NO GROWTH 2 DAYS Performed at [facility], [address], [place], [place] [address]    Report  Status PENDING  Incomplete  Culture, blood (Routine x 2)     Status: None (Preliminary result)   Collection Time: 04/11/24  5:14 AM   Specimen: BLOOD LEFT FOREARM  Result Value Ref Range Status   Specimen Description   Final    BLOOD LEFT FOREARM Performed at [facility], [address], [place], [place] [address]    Special Requests   Final    BOTTLES DRAWN AEROBIC AND ANAEROBIC Blood Culture results may not be optimal due to an inadequate volume of blood received in  culture bottles Performed at [facility], [address], [place], [place] [address]    Culture   Final    NO GROWTH 2 DAYS Performed at [facility], [address], [place], [place] [address]    Report Status PENDING  Incomplete  MRSA Next Gen by PCR, Nasal     Status: None   Collection Time: 04/11/24 12:07 PM   Specimen: Nasal Mucosa; Nasal Swab  Result Value Ref Range Status   MRSA by PCR Next Gen NOT DETECTED NOT DETECTED Final    Comment: (NOTE) The GeneXpert MRSA Assay (FDA approved for NASAL specimens only), is one component of a comprehensive MRSA colonization surveillance program. It is not intended to diagnose MRSA infection nor to guide or monitor treatment for MRSA infections. Test performance is not FDA approved in patients less than 43 years old. Performed at [facility], [address], [place], [place] [address]      Labs: BNP (last 3 results) No results for input(s): BNP in the last 8760 hours. Basic Metabolic Panel: Recent Labs  Lab 04/11/24 0514 04/12/24 0948 04/13/24 0310  NA 140 140 139  K 3.8 4.1 3.7  CL 102 109 106  CO2 19* 19* 23  GLUCOSE 165* 135* 74  BUN 15 10 10   CREATININE 1.27* 0.81 0.74  CALCIUM 9.7 8.4* 8.6*  MG  --  2.4 2.0  PHOS  --  2.8 3.0   Liver Function Tests: Recent Labs  Lab 04/11/24 0514  AST 76*  ALT 84*  ALKPHOS 87  BILITOT 0.5  PROT 8.3*  ALBUMIN 4.3   No results for input(s): LIPASE, AMYLASE in the last 168 hours. Recent Labs  Lab 04/11/24 0514  AMMONIA 25   CBC: Recent Labs  Lab 04/11/24 0514 04/12/24 0948 04/13/24 0310  WBC 22.1* 8.5 8.7  NEUTROABS 18.8*  --   --   HGB 14.6 12.3* 12.5*  HCT 44.9 39.5 38.0*  MCV 86.7 88.0 83.5  PLT 483* 400 441*   Cardiac Enzymes: Recent Labs  Lab 04/11/24 0810  CKTOTAL 140   BNP: Invalid input(s): POCBNP CBG: No results for input(s): GLUCAP in the last 168 hours. D-Dimer No results for input(s):  DDIMER in the last 72 hours. Hgb A1c No results for input(s): HGBA1C in the last 72 hours. Lipid Profile No results for input(s): CHOL, HDL, LDLCALC, TRIG, CHOLHDL, LDLDIRECT in the last 72 hours. Thyroid function studies No results for input(s): TSH, T4TOTAL, T3FREE, THYROIDAB in the last 72 hours.  Invalid input(s): FREET3 Anemia work up No results for input(s): VITAMINB12, FOLATE, FERRITIN, TIBC, IRON, RETICCTPCT in the last 72 hours. Urinalysis    Component Value Date/Time   COLORURINE YELLOW 04/11/2024 0953   APPEARANCEUR HAZY (A) 04/11/2024 0953   LABSPEC 1.017 04/11/2024 0953   PHURINE 5.0 04/11/2024 0953   GLUCOSEU NEGATIVE 04/11/2024 0953   HGBUR MODERATE (A) 04/11/2024 9046  BILIRUBINUR NEGATIVE 04/11/2024 0953   KETONESUR NEGATIVE 04/11/2024 0953   PROTEINUR 30 (A) 04/11/2024 0953   UROBILINOGEN 1.0 01/12/2015 1356   NITRITE NEGATIVE 04/11/2024 0953   LEUKOCYTESUR NEGATIVE 04/11/2024 0953   Sepsis Labs Recent Labs  Lab 04/11/24 0514 04/12/24 0948 04/13/24 0310  WBC 22.1* 8.5 8.7   Microbiology Recent Results (from the past 240 hours)  Culture, blood (Routine x 2)     Status: None (Preliminary result)   Collection Time: 04/11/24  5:14 AM   Specimen: BLOOD  Result Value Ref Range Status   Specimen Description   Final    BLOOD RIGHT ANTECUBITAL Performed at [facility], [address], [place], [place] [address]    Special Requests   Final    BOTTLES DRAWN AEROBIC AND ANAEROBIC Blood Culture results may not be optimal due to an inadequate volume of blood received in culture bottles Performed at [facility], [address] [place], [place] [address]    Culture   Final    NO GROWTH 2 DAYS Performed at [facility], [address], [place], [place] [address]    Report Status PENDING  Incomplete  Culture, blood (Routine x 2)     Status: None (Preliminary result)    Collection Time: 04/11/24  5:14 AM   Specimen: BLOOD LEFT FOREARM  Result Value Ref Range Status   Specimen Description   Final    BLOOD LEFT FOREARM Performed at [facility], [address], [place], [place] [address]    Special Requests   Final    BOTTLES DRAWN AEROBIC AND ANAEROBIC Blood Culture results may not be optimal due to an inadequate volume of blood received in culture bottles Performed at [facility], [address], [place], [place] [address]    Culture   Final    NO GROWTH 2 DAYS Performed at [facility], [address] [place] [address] [place], [place] [address]    Report Status PENDING  Incomplete  MRSA Next Gen by PCR, Nasal     Status: None   Collection Time: 04/11/24 12:07 PM   Specimen: Nasal Mucosa; Nasal Swab  Result Value Ref Range Status   MRSA by PCR Next Gen NOT DETECTED NOT DETECTED Final    Comment: (NOTE) The GeneXpert MRSA Assay (FDA approved for NASAL specimens only), is one component of a comprehensive MRSA colonization surveillance program. It is not intended to diagnose MRSA infection nor to guide or monitor treatment for MRSA infections. Test performance is not FDA approved in patients less than 34 years old. Performed at [facility], [address] [place], [place] [address]      Time coordinating discharge: Over 30 minutes  SIGNED:   Elsie JAYSON Montclair, DO [facility] 04/13/2024, 12:18 PM Pager   If 7PM-7AM, please contact night-coverage [email or web address]

## 2024-04-13 NOTE — TOC Transition Note (Signed)
 Transition of Care [facility]) - Discharge Note   Patient Details  Name: Mark Porter MRN: [id number] Date of Birth: 12-08-1965  Transition of Care [facility]) CM/SW Contact:  Alfonse JONELLE Rex, RN Phone Number: 04/13/2024, 1:38 PM   Clinical Narrative:   DC to [facility] [address], [place], [place] [address]. [facility] for transport. No further TOC needs identified at this time.               Final next level of care: Home/Self Care Barriers to Discharge: Barriers Resolved   Patient Goals and CMS Choice Patient states their goals for this hospitalization and ongoing recovery are:: return to hotel          Discharge Placement                       Discharge Plan and Services Additional resources added to the After Visit Summary for                                       Social Drivers of Health (SDOH) Interventions SDOH Screenings   Food Insecurity: Food Insecurity Present (04/12/2024)  Housing: High Risk (04/12/2024)  Transportation Needs: Unmet Transportation Needs (04/12/2024)  Utilities: Not At Risk (04/12/2024)  Alcohol Screen: Low Risk  (06/22/2023)  Social Connections: Unknown (11/23/2021)   Received from [facility]  Tobacco Use: High Risk (09/11/2023)     Readmission Risk Interventions    04/13/2024   11:45 AM 07/08/2023    1:05 PM 07/06/2023   10:14 AM  Readmission Risk Prevention Plan  Transportation Screening Complete Complete Complete  PCP or Specialist Appt within 3-5 Days Complete    HRI or Home Care Consult Complete    Social Work Consult for Recovery Care Planning/Counseling Complete    Palliative Care Screening Not Applicable    Medication Review Oceanographer) Complete Complete Complete  PCP or Specialist appointment within 3-5 days of discharge  Complete   HRI or Home Care Consult  Complete Complete  SW Recovery Care/Counseling Consult  Complete Complete  Palliative Care Screening  Not  Applicable Not Applicable  Skilled Nursing Facility  Complete Complete

## 2024-04-13 NOTE — TOC Initial Note (Signed)
 Transition of Care [facility]) - Initial/Assessment Note    Patient Details  Name: RAYMAN PETROSIAN MRN: [id number] Date of Birth: 06/01/1966  Transition of [facility]) CM/SW Contact:    Alfonse JONELLE Rex, RN Phone Number: 04/13/2024, 11:47 AM  Clinical Narrative:   Teams chat receied from bedside nurse, expected dc today, patient is a quadraplegic, wheelchair bound, will need transportation to hotel he resides in with a roommate. NCM met with patient at bedside, patient confirmed he lives in hotel: [facility] [address], [address], [place], [place] [address]. Will provided PTAR at discharge.                 Expected Discharge Plan: Home/Self Care Barriers to Discharge: Continued Medical Work up   Patient Goals and CMS Choice Patient states their goals for this hospitalization and ongoing recovery are:: return to hotel          Expected Discharge Plan and Services       Living arrangements for the past 2 months: Hotel/Motel                                      Prior Living Arrangements/Services Living arrangements for the past 2 months: Hotel/Motel Lives with:: Roommate Patient language and need for interpreter reviewed:: Yes        Need for Family Participation in Patient Care: Yes (Comment) Care giver support system in place?: Yes (comment) Current home services: DME (motorized wheelchair) Criminal Activity/Legal Involvement Pertinent to Current Situation/Hospitalization: No - Comment as needed  Activities of Daily Living   ADL Screening (condition at time of admission) Independently performs ADLs?: Yes (appropriate for developmental age) Is the patient deaf or have difficulty hearing?: No Does the patient have difficulty seeing, even when wearing glasses/contacts?: No Does the patient have difficulty concentrating, remembering, or making decisions?: No  Permission Sought/Granted                  Emotional Assessment Appearance:: Appears older than  stated age Attitude/Demeanor/Rapport: Other (comment) Affect (typically observed): Unable to Assess Orientation: : Oriented to Self, Oriented to Place, Oriented to  Time, Oriented to Situation Alcohol / Substance Use: Not Applicable Psych Involvement: No (comment)  Admission diagnosis:  AKI (acute kidney injury) [N17.9] Sepsis (HCC) [A41.9] Hypotension, unspecified hypotension type [I95.9] Sepsis with acute renal failure and septic shock, due to unspecified organism, unspecified acute renal failure type (HCC) [A41.9, R65.21, N17.9] Overdose of undetermined intent, initial encounter [T50.904A] Patient Active Problem List   Diagnosis Date Noted   Sepsis (HCC) 04/11/2024   MDD (major depressive disorder), recurrent, severe, with psychosis (HCC) 07/05/2023   Osteoporosis 07/04/2023   Femoral neck fracture (HCC) 07/04/2023   Femoral fracture (HCC) 07/04/2023   Major depressive disorder, recurrent episode, severe (HCC) 06/23/2023   Cocaine dependence with hallucinations (HCC) 06/23/2023   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 [place] [address] Phone: [phone] Fax: [phone]     Social Drivers of Health (SDOH) Social History: SDOH Screenings   Food Insecurity: Food Insecurity Present (04/12/2024)  Housing: High Risk (04/12/2024)  Transportation Needs: Unmet Transportation Needs  (04/12/2024)  Utilities: Not At Risk (04/12/2024)  Alcohol Screen: Low Risk  (06/22/2023)  Social Connections: Unknown (11/23/2021)   Received from [facility]  Tobacco Use: High Risk (09/11/2023)   SDOH Interventions:     Readmission Risk Interventions    04/13/2024   11:45 AM 07/08/2023    1:05 PM 07/06/2023   10:14 AM  Readmission Risk Prevention Plan  Transportation Screening Complete Complete Complete  PCP or Specialist Appt within 3-5 Days Complete    HRI or Home Care Consult Complete    Social Work Consult for Recovery Care Planning/Counseling Complete    Palliative Care Screening Not Applicable    Medication Review Oceanographer) Complete Complete Complete  PCP or Specialist appointment within 3-5 days of discharge  Complete   HRI or Home Care Consult  Complete Complete  SW Recovery Care/Counseling Consult  Complete Complete  Palliative Care Screening  Not Applicable Not Applicable  Skilled Nursing Facility  Complete Complete

## 2024-04-13 NOTE — Plan of Care (Signed)
  Problem: Clinical Measurements: Goal: Respiratory complications will improve Outcome: Progressing Goal: Cardiovascular complication will be avoided Outcome: Progressing   Problem: Coping: Goal: Level of anxiety will decrease Outcome: Progressing   Problem: Elimination: Goal: Will not experience complications related to urinary retention Outcome: Progressing   

## 2024-04-16 LAB — CULTURE, BLOOD (ROUTINE X 2)
Culture: NO GROWTH
Culture: NO GROWTH
